# Patient Record
Sex: Female | Born: 1980
Health system: Southern US, Community
[De-identification: ages and names within clinical notes are randomized; demographics above are authoritative.]

## PROBLEM LIST (undated history)

## (undated) ENCOUNTER — Emergency Department: Discharge status: Absent without leave | Payer: 59

## (undated) DIAGNOSIS — R519 Headache, unspecified: Secondary | ICD-10-CM

## (undated) DIAGNOSIS — D649 Anemia, unspecified: Secondary | ICD-10-CM

## (undated) DIAGNOSIS — F419 Anxiety disorder, unspecified: Secondary | ICD-10-CM

## (undated) DIAGNOSIS — T7840XA Allergy, unspecified, initial encounter: Secondary | ICD-10-CM

## (undated) DIAGNOSIS — K589 Irritable bowel syndrome without diarrhea: Secondary | ICD-10-CM

## (undated) DIAGNOSIS — B019 Varicella without complication: Secondary | ICD-10-CM

## (undated) DIAGNOSIS — R51 Headache: Principal | ICD-10-CM

## (undated) HISTORY — DX: Anxiety disorder, unspecified: F41.9

## (undated) HISTORY — DX: Allergy, unspecified, initial encounter: T78.40XA

## (undated) HISTORY — DX: Headache, unspecified: R51.9

## (undated) HISTORY — PX: WISDOM TOOTH EXTRACTION: SHX21

## (undated) HISTORY — DX: Anemia, unspecified: D64.9

## (undated) HISTORY — DX: Headache: R51

## (undated) HISTORY — DX: Varicella without complication: B01.9

---

## 1999-11-04 ENCOUNTER — Other Ambulatory Visit: Admission: RE | Admit: 1999-11-04 | Discharge: 1999-11-04 | Payer: Self-pay | Admitting: Family Medicine

## 2001-02-14 ENCOUNTER — Other Ambulatory Visit: Admission: RE | Admit: 2001-02-14 | Discharge: 2001-02-14 | Payer: Self-pay | Admitting: Obstetrics and Gynecology

## 2002-07-05 HISTORY — PX: HEMORROIDECTOMY: SUR656

## 2008-07-05 HISTORY — PX: TONSILLECTOMY: SUR1361

## 2014-03-08 ENCOUNTER — Ambulatory Visit: Payer: Self-pay | Admitting: Gastroenterology

## 2014-08-12 ENCOUNTER — Emergency Department: Payer: Self-pay | Admitting: Emergency Medicine

## 2014-10-22 ENCOUNTER — Ambulatory Visit (INDEPENDENT_AMBULATORY_CARE_PROVIDER_SITE_OTHER): Payer: BLUE CROSS/BLUE SHIELD | Admitting: Internal Medicine

## 2014-10-22 ENCOUNTER — Encounter: Payer: Self-pay | Admitting: Internal Medicine

## 2014-10-22 VITALS — BP 106/70 | HR 88 | Temp 98.1°F | Ht 65.5 in | Wt 156.0 lb

## 2014-10-22 DIAGNOSIS — R519 Headache, unspecified: Secondary | ICD-10-CM

## 2014-10-22 DIAGNOSIS — K648 Other hemorrhoids: Secondary | ICD-10-CM | POA: Diagnosis not present

## 2014-10-22 DIAGNOSIS — R51 Headache: Secondary | ICD-10-CM

## 2014-10-22 DIAGNOSIS — K589 Irritable bowel syndrome without diarrhea: Secondary | ICD-10-CM | POA: Insufficient documentation

## 2014-10-22 DIAGNOSIS — K649 Unspecified hemorrhoids: Secondary | ICD-10-CM | POA: Insufficient documentation

## 2014-10-22 DIAGNOSIS — J302 Other seasonal allergic rhinitis: Secondary | ICD-10-CM | POA: Diagnosis not present

## 2014-10-22 NOTE — Patient Instructions (Signed)
Diet and Irritable Bowel Syndrome  No cure has been found for irritable bowel syndrome (IBS). Many options are available to treat the symptoms. Your caregiver will give you the best treatments available for your symptoms. He or she will also encourage you to manage stress and to make changes to your diet. You need to work with your caregiver and Registered Dietician to find the best combination of medicine, diet, counseling, and support to control your symptoms. The following are some diet suggestions. FOODS THAT MAKE IBS WORSE  Fatty foods, such as [place] fries.  Milk products, such as cheese or ice cream.  Chocolate.  Alcohol.  Caffeine (found in coffee and some sodas).  Carbonated drinks, such as soda. If certain foods cause symptoms, you should eat less of them or stop eating them. FOOD JOURNAL   Keep a journal of the foods that seem to cause distress. Write down:  What you are eating during the day and when.  What problems you are having after eating.  When the symptoms occur in relation to your meals.  What foods always make you feel badly.  Take your notes with you to your caregiver to see if you should stop eating certain foods. FOODS THAT MAKE IBS BETTER Fiber reduces IBS symptoms, especially constipation, because it makes stools soft, bulky, and easier to pass. Fiber is found in bran, bread, cereal, beans, fruit, and vegetables. Examples of foods with fiber include:  Apples.  Peaches.  Pears.  Berries.  Figs.  Broccoli, raw.  Cabbage.  Carrots.  Raw peas.  Kidney beans.  Lima beans.  Whole-grain bread.  Whole-grain cereal. Add foods with fiber to your diet a little at a time. This will let your body get used to them. Too much fiber at once might cause gas and swelling of your abdomen. This can trigger symptoms in a person with IBS. Caregivers usually recommend a diet with enough fiber to produce soft, painless bowel movements. High fiber diets may  cause gas and bloating. However, these symptoms often go away within a few weeks, as your body adjusts. In many cases, dietary fiber may lessen IBS symptoms, particularly constipation. However, it may not help pain or diarrhea. High fiber diets keep the colon mildly enlarged (distended) with the added fiber. This may help prevent spasms in the colon. Some forms of fiber also keep water in the stool, thereby preventing hard stools that are difficult to pass.  Besides telling you to eat more foods with fiber, your caregiver may also tell you to get more fiber by taking a fiber pill or drinking water mixed with a special high fiber powder. An example of this is a natural fiber laxative containing psyllium seed.  TIPS  Large meals can cause cramping and diarrhea in people with IBS. If this happens to you, try eating 4 or 5 small meals a day, or try eating less at each of your usual 3 meals. It may also help if your meals are low in fat and high in carbohydrates. Examples of carbohydrates are pasta, rice, whole-grain breads and cereals, fruits, and vegetables.  If dairy products cause your symptoms to flare up, you can try eating less of those foods. You might be able to handle yogurt better than other dairy products, because it contains bacteria that helps with digestion. Dairy products are an important source of calcium and other nutrients. If you need to avoid dairy products, be sure to talk with a Registered Dietitian about getting these nutrients  through other food sources.  Drink enough water and fluids to keep your urine clear or pale yellow. This is important, especially if you have diarrhea. FOR MORE INFORMATION  International Foundation for Functional Gastrointestinal Disorders: www.iffgd.org  National Digestive Diseases Information Clearinghouse: digestive.AmenCredit.is Document Released: 09/11/2003 Document Revised: 09/13/2011 Document Reviewed: 09/21/2013 [facility] Patient Information 2015  [place], [place]. This information is not intended to replace advice given to you by your health care provider. Make sure you discuss any questions you have with your health care provider.

## 2014-10-22 NOTE — Assessment & Plan Note (Signed)
Controlled by avoid her triggers Imitrex is effective in severe cases She does not need a refill at this time

## 2014-10-22 NOTE — Assessment & Plan Note (Signed)
Chronic but stable Continue probiotic daily Reviewed ingredients in Plexus GI cleanse, appears to be safe to take daily Will not pursue further workup with GI unless symptoms worsen

## 2014-10-22 NOTE — Progress Notes (Signed)
HPI  Pt presents to the clinic today to establish care and for management of the conditions listed below. She has not had a PCP in many years.  Frequent Headaches: Occuring about once per month. They are triggered by [place] food, too much or too little sleep. She has stopped caffeine, and alcohol. The headaches are located on the right side of her head. They are associated with nausea, vomiting, sensitivity to light and sound. When they first occur, she will try essential oils. If that is ineffective, she will take Excedrin Migraine. If that doesn't work, she will take the Imitrex and half a Pnenergan and lay in a dark room.  She also reports having stomach issues. She reports alternating constipation and diarrhea. She has been evaluated by GI in the past. She had a colonoscopy in 2014 which was normal. She had a hemorrhoidectomy in 2014 but continues to have issues with internal hemmorrhoids. She is using essential oils and taking a probiotic daily. She has also been taking a Plexus GI cleanse but just wanted to verify that it was safe to take daily.  Allergies: she reports she does not have a history of this. She went to UC a few weeks ago with c/o itchy eyes. They told her it was allergies and put her Pazeo daily prn. She reports it has worked well for her. She is not taking it daily.  Flu: 2014 Tetanus: 2014 LMP: 10/08/2014 Pap Smear: 09/2014, G2P2 Dentist: [facility]  Past Medical History  Diagnosis Date  . Chicken pox   . Frequent headaches   . Allergy     Current Outpatient Prescriptions  Medication Sig Dispense Refill  . LO LOESTRIN FE 1 MG-10 MCG / 10 MCG tablet Take 1 tablet by mouth daily.  12  . Multiple Vitamin (MULTIVITAMIN) tablet Take 1 tablet by mouth daily.    Marland Kitchen PAZEO 0.7 % SOLN Place 1 drop into both eyes. For 14 days as needed for allergies  2  . Promethazine HCl (PHENERGAN PO) Take 1 tablet by mouth as needed.    . SUMAtriptan (IMITREX) 100 MG tablet Take 1 tablet by  mouth. For migraine may repeat in 2hrs if not better Max 2/24hr  2   No current facility-administered medications for this visit.    No Known Allergies  Family History  Problem Relation Age of Onset  . Arthritis Father   . Hyperlipidemia Father   . Hypertension Father   . Arthritis Maternal Grandmother   . Stroke Maternal Grandfather   . Cancer Paternal Grandmother     Ovarian     History   Social History  . Marital Status: Married    Spouse Name: N/A  . Number of Children: N/A  . Years of Education: N/A   Occupational History  . Not on file.   Social History Main Topics  . Smoking status: Never Smoker   . Smokeless tobacco: Never Used  . Alcohol Use: 0.0 oz/week    0 Standard drinks or equivalent per week     Comment: rare  . Drug Use: No  . Sexual Activity: Not on file   Other Topics Concern  . Not on file   Social History Narrative  . No narrative on file    ROS:  Constitutional: Denies fever, malaise, fatigue, headache or abrupt weight changes.  HEENT: Denies eye pain, eye redness, ear pain, ringing in the ears, wax buildup, runny nose, nasal congestion, bloody nose, or sore throat. Respiratory: Denies difficulty breathing, shortness  of breath, cough or sputum production.   Cardiovascular: Denies chest pain, chest tightness, palpitations or swelling in the hands or feet.  Gastrointestinal: Denies abdominal pain, bloating, constipation, diarrhea or blood in the stool.  Skin: Denies redness, rashes, lesions or ulcercations.  Neurological: Denies dizziness, difficulty with memory, difficulty with speech or problems with balance and coordination.  Psych: Denies anxiety, depression, SI/HI.  No other specific complaints in a complete review of systems (except as listed in HPI above).  PE:  BP 106/70 mmHg  Pulse 88  Temp(Src) 98.1 F (36.7 C) (Oral)  Ht 5' 5.5" (1.664 m)  Wt 156 lb (70.761 kg)  BMI 25.56 kg/m2  SpO2 98%  LMP 10/08/2014 Wt Readings  from Last 3 Encounters:  10/22/14 156 lb (70.761 kg)    General: Appears her stated age, well developed, well nourished in NAD. HEENT: Head: normal shape and size; Eyes: sclera white, no icterus, conjunctiva pink, PERRLA and EOMs intact;  Cardiovascular: Normal rate and rhythm. S1,S2 noted.  No murmur, rubs or gallops noted.  Pulmonary/Chest: Normal effort and positive vesicular breath sounds. No respiratory distress. No wheezes, rales or ronchi noted.  Abdomen: Soft and nontender. Normal bowel sounds, no bruits noted. No distention or masses noted. Liver, spleen and kidneys non palpable. Neurological: Alert and oriented. Psychiatric: Mood and affect normal. Behavior is normal. Judgment and thought content normal.     Assessment and Plan:  Make an appt for an annual exam

## 2014-10-22 NOTE — Progress Notes (Signed)
Pre visit review using our clinic review tool, if applicable. No additional management support is needed unless otherwise documented below in the visit note. 

## 2014-10-22 NOTE — Assessment & Plan Note (Signed)
Chronic secondary to IBS She has had hemorrhoidectomy in the past She takes probiotic daily She has cream and wipes and uses them as needed Advised her to drink plenty of water

## 2014-10-22 NOTE — Assessment & Plan Note (Signed)
Continue Pazeo prn

## 2014-11-18 ENCOUNTER — Encounter: Payer: BLUE CROSS/BLUE SHIELD | Admitting: Internal Medicine

## 2014-11-25 ENCOUNTER — Encounter (INDEPENDENT_AMBULATORY_CARE_PROVIDER_SITE_OTHER): Payer: Self-pay

## 2014-11-25 ENCOUNTER — Ambulatory Visit (INDEPENDENT_AMBULATORY_CARE_PROVIDER_SITE_OTHER): Payer: BLUE CROSS/BLUE SHIELD | Admitting: Internal Medicine

## 2014-11-25 ENCOUNTER — Encounter: Payer: Self-pay | Admitting: Internal Medicine

## 2014-11-25 VITALS — BP 104/68 | HR 83 | Temp 97.8°F | Ht 65.5 in | Wt 155.0 lb

## 2014-11-25 DIAGNOSIS — K589 Irritable bowel syndrome without diarrhea: Secondary | ICD-10-CM

## 2014-11-25 DIAGNOSIS — Z0000 Encounter for general adult medical examination without abnormal findings: Secondary | ICD-10-CM | POA: Diagnosis not present

## 2014-11-25 DIAGNOSIS — N912 Amenorrhea, unspecified: Secondary | ICD-10-CM

## 2014-11-25 LAB — COMPREHENSIVE METABOLIC PANEL
ALBUMIN: 4.1 g/dL (ref 3.5–5.2)
ALT: 17 U/L (ref 0–35)
AST: 15 U/L (ref 0–37)
Alkaline Phosphatase: 39 U/L (ref 39–117)
BILIRUBIN TOTAL: 0.4 mg/dL (ref 0.2–1.2)
BUN: 22 mg/dL (ref 6–23)
CALCIUM: 9 mg/dL (ref 8.4–10.5)
CHLORIDE: 105 meq/L (ref 96–112)
CO2: 23 mEq/L (ref 19–32)
Creatinine, Ser: 0.52 mg/dL (ref 0.40–1.20)
GFR: 141.81 mL/min (ref >60.00)
GLUCOSE: 96 mg/dL (ref 70–99)
POTASSIUM: 3.7 meq/L (ref 3.5–5.1)
Sodium: 136 mEq/L (ref 135–145)
TOTAL PROTEIN: 7.1 g/dL (ref 6.0–8.3)

## 2014-11-25 LAB — LIPID PANEL
Cholesterol: 201 mg/dL — ABNORMAL HIGH (ref 0–200)
HDL: 47.1 mg/dL (ref >39.00)
LDL CALC: 140 mg/dL — AB (ref 0–99)
NonHDL: 153.9
TRIGLYCERIDES: 70 mg/dL (ref 0.0–149.0)
Total CHOL/HDL Ratio: 4
VLDL: 14 mg/dL (ref 0.0–40.0)

## 2014-11-25 LAB — CBC
HEMATOCRIT: 36.2 % (ref 36.0–46.0)
HEMOGLOBIN: 12.3 g/dL (ref 12.0–15.0)
MCHC: 33.9 g/dL (ref 30.0–36.0)
MCV: 88.6 fl (ref 78.0–100.0)
Platelets: 334 10*3/uL (ref 150.0–400.0)
RBC: 4.08 Mil/uL (ref 3.87–5.11)
RDW: 13 % (ref 11.5–15.5)
WBC: 8.1 10*3/uL (ref 4.0–10.5)

## 2014-11-25 LAB — LUTEINIZING HORMONE: LH: 1.41 m[IU]/mL

## 2014-11-25 LAB — FOLLICLE STIMULATING HORMONE: FSH: 3.2 m[IU]/mL

## 2014-11-25 MED ORDER — SERTRALINE HCL 50 MG PO TABS
50.0000 mg | ORAL_TABLET | Freq: Every day | ORAL | Status: DC
Start: 2014-11-25 — End: 2015-03-18

## 2014-11-25 NOTE — Patient Instructions (Signed)

## 2014-11-25 NOTE — Progress Notes (Signed)
Pre visit review using our clinic review tool, if applicable. No additional management support is needed unless otherwise documented below in the visit note. 

## 2014-11-25 NOTE — Progress Notes (Signed)
Subjective:    Patient ID: Robin West, female    DOB: 12-17-1980, 34 y.o.   MRN: [id number]  HPI  Pt presents to the clinic today for her annual exam.  Flu: 2014 Tetanus: 2014 LMP: 10/08/2014, she has not had a period since this time. She is sexually active but takes OCP's daily. She has already been through 1 week of placebo's without a menstrual and is about to go through her second week of placebo's. She did take a urine pregnancy test at home which was negative. Pap Smear: 09/2014, G2P2 Dentist: [facility]  Diet: She consumes a well balanced diet, filled with fruits and vegetables. Exercise: She exercises 3-4 days per week.  She is concerned about her IBS. She reports she has alternating constipation and diarrhea. She has had about 15 loose stools in the last 24-48 hours. She has no abdominal cramping. She denies blood in her stool, nausea or vomiting. She was on a medication in the past for this but can not remember the name. She has been seen by a GI doctor but it has been a few years.  Review of Systems      Past Medical History  Diagnosis Date  . Chicken pox   . Frequent headaches   . Allergy     Current Outpatient Prescriptions  Medication Sig Dispense Refill  . LO LOESTRIN FE 1 MG-10 MCG / 10 MCG tablet Take 1 tablet by mouth daily.  12  . Multiple Vitamin (MULTIVITAMIN) tablet Take 1 tablet by mouth daily.    Marland Kitchen PAZEO 0.7 % SOLN Place 1 drop into both eyes. For 14 days as needed for allergies  2  . Promethazine HCl (PHENERGAN PO) Take 1 tablet by mouth as needed.    . SUMAtriptan (IMITREX) 100 MG tablet Take 1 tablet by mouth. For migraine may repeat in 2hrs if not better Max 2/24hr  2   No current facility-administered medications for this visit.    No Known Allergies  Family History  Problem Relation Age of Onset  . Arthritis Father   . Hyperlipidemia Father   . Hypertension Father   . Arthritis Maternal Grandmother   . Stroke Maternal Grandfather   .  Cancer Paternal Grandmother     Ovarian   . Cancer Paternal Aunt     ovarian cancer    History   Social History  . Marital Status: Married    Spouse Name: N/A  . Number of Children: N/A  . Years of Education: N/A   Occupational History  . Not on file.   Social History Main Topics  . Smoking status: Never Smoker   . Smokeless tobacco: Never Used  . Alcohol Use: 0.0 oz/week    0 Standard drinks or equivalent per week     Comment: rare  . Drug Use: No  . Sexual Activity: Yes   Other Topics Concern  . Not on file   Social History Narrative     Constitutional: Denies fever, malaise, fatigue, headache or abrupt weight changes.  HEENT: Denies eye pain, eye redness, ear pain, ringing in the ears, wax buildup, runny nose, nasal congestion, bloody nose, or sore throat. Respiratory: Denies difficulty breathing, shortness of breath, cough or sputum production.   Cardiovascular: Denies chest pain, chest tightness, palpitations or swelling in the hands or feet.  Gastrointestinal: Pt reports diarrhea and constipation. Denies abdominal pain, bloating, or blood in the stool.  GU: Pt reports amenorrhea. Denies urgency, frequency, pain with urination, burning sensation,  blood in urine, odor or discharge. Musculoskeletal: Denies decrease in range of motion, difficulty with gait, muscle pain or joint pain and swelling.  Skin: Denies redness, rashes, lesions or ulcercations.  Neurological: Denies dizziness, difficulty with memory, difficulty with speech or problems with balance and coordination.  Psych: Pt denies anxiety, depression, SI/HI.  No other specific complaints in a complete review of systems (except as listed in HPI above).  Objective:   Physical Exam   BP 104/68 mmHg  Pulse 83  Temp(Src) 97.8 F (36.6 C) (Oral)  Ht 5' 5.5" (1.664 m)  Wt 155 lb (70.308 kg)  BMI 25.39 kg/m2  SpO2 98%  LMP 10/01/2014 Wt Readings from Last 3 Encounters:  11/25/14 155 lb (70.308 kg)    10/22/14 156 lb (70.761 kg)    General: Appears her stated age, well developed, well nourished in NAD. Skin: Warm, dry and intact. No rashes, lesions or ulcerations noted. HEENT: Head: normal shape and size; Eyes: sclera white, no icterus, conjunctiva pink, PERRLA and EOMs intact; Ears: Tm's gray and intact, normal light reflex; Throat/Mouth: Teeth present, mucosa pink and moist, no exudate, lesions or ulcerations noted.  Neck: Normal range of motion. Neck supple, trachea midline. No massses, lumps or thyromegaly present.  Cardiovascular: Normal rate and rhythm. S1,S2 noted.  No murmur, rubs or gallops noted. No JVD or BLE edema. No carotid bruits noted. Pulmonary/Chest: Normal effort and positive vesicular breath sounds. No respiratory distress. No wheezes, rales or ronchi noted.  Abdomen: Soft and nontender. Normal bowel sounds, no bruits noted. No distention or masses noted. Liver, spleen and kidneys non palpable. Musculoskeletal: Strength 5/5 BUE/BLE. No signs of joint swelling. No difficulty with gait.  Neurological: Alert and oriented. Cranial nerves II-XII grossly intact. Coordination normal.  Psychiatric: Mood slightly anxious today but affect normal. Behavior is normal. Judgment and thought content normal.        Assessment & Plan:   Preventative Health Maintenance:  Will check CBC, CMET and Lipid profile today She will get a flu shot in the fall All other HM UTD Encouraged to continue a healthy lifestyle  Amenorrhea:  Will check serum hcg Will check FSH/LH If labs normal, will have her follow up with her GYN  IBS:  Discussed treatment options She is interested in trying an SSRI eRx for Zoloft She will update me in 4 weeks and let me know how she is doing  RTC in 1 year or sooner if needed

## 2014-11-26 LAB — HCG, SERUM, QUALITATIVE: Preg, Serum: NEGATIVE

## 2015-02-20 ENCOUNTER — Encounter: Payer: Self-pay | Admitting: Internal Medicine

## 2015-02-20 ENCOUNTER — Ambulatory Visit (INDEPENDENT_AMBULATORY_CARE_PROVIDER_SITE_OTHER): Payer: BLUE CROSS/BLUE SHIELD | Admitting: Internal Medicine

## 2015-02-20 ENCOUNTER — Ambulatory Visit (INDEPENDENT_AMBULATORY_CARE_PROVIDER_SITE_OTHER)
Admission: RE | Admit: 2015-02-20 | Discharge: 2015-02-20 | Disposition: A | Discharge status: Home / Self Care | Payer: BLUE CROSS/BLUE SHIELD | Source: Ambulatory Visit | Attending: Internal Medicine | Admitting: Internal Medicine

## 2015-02-20 VITALS — BP 104/70 | HR 87 | Temp 98.2°F | Wt 153.0 lb

## 2015-02-20 DIAGNOSIS — M25511 Pain in right shoulder: Secondary | ICD-10-CM

## 2015-02-20 DIAGNOSIS — M25551 Pain in right hip: Secondary | ICD-10-CM

## 2015-02-20 DIAGNOSIS — W19XXXA Unspecified fall, initial encounter: Secondary | ICD-10-CM | POA: Diagnosis not present

## 2015-02-20 DIAGNOSIS — Y92009 Unspecified place in unspecified non-institutional (private) residence as the place of occurrence of the external cause: Secondary | ICD-10-CM

## 2015-02-20 DIAGNOSIS — M545 Low back pain, unspecified: Secondary | ICD-10-CM

## 2015-02-20 DIAGNOSIS — Y92099 Unspecified place in other non-institutional residence as the place of occurrence of the external cause: Secondary | ICD-10-CM

## 2015-02-20 MED ORDER — METHOCARBAMOL 500 MG PO TABS: 500.0000 mg | ORAL_TABLET | Freq: Three times a day (TID) | ORAL | Status: DC

## 2015-02-20 MED ORDER — HYDROCODONE-ACETAMINOPHEN 10-325 MG PO TABS: 1.0000 | ORAL_TABLET | Freq: Three times a day (TID) | ORAL | Status: DC | PRN

## 2015-02-20 NOTE — Progress Notes (Signed)
Subjective:    Patient ID: Robin West, female    DOB: 05/22/1981, 34 y.o.   MRN: [id number]  HPI  Pt presents to the clinic today with c/o a fall. This occurred this morning. She was walking into the garage from her house when she missed the first step. She landed on her buttocks. She caught herself with her right arm.She has pain that starts in the right side of her back and radiates into her right hip and leg. She describes the pain as sharp and shooting. She denies numbness and tingling. The pain is worse with weight bearing. She has pain in her right shoulder but denies weakness, numbness or tingling. She reports she hit her face as well on the concrete floor. She denies LOC but did see some blood in her mouth. She has not tried anything OTC. She denies loss of bowel or bladder.  Review of Systems      Past Medical History  Diagnosis Date  . Chicken pox   . Frequent headaches   . Allergy     Current Outpatient Prescriptions  Medication Sig Dispense Refill  . LO LOESTRIN FE 1 MG-10 MCG / 10 MCG tablet Take 1 tablet by mouth daily.  12  . PAZEO 0.7 % SOLN Place 1 drop into both eyes. For 14 days as needed for allergies  2  . Promethazine HCl (PHENERGAN PO) Take 1 tablet by mouth as needed.    . sertraline (ZOLOFT) 50 MG tablet Take 1 tablet (50 mg total) by mouth daily. 30 tablet 3  . SUMAtriptan (IMITREX) 100 MG tablet Take 1 tablet by mouth. For migraine may repeat in 2hrs if not better Max 2/24hr  2  . Multiple Vitamin (MULTIVITAMIN) tablet Take 1 tablet by mouth daily.     No current facility-administered medications for this visit.    No Known Allergies  Family History  Problem Relation Age of Onset  . Arthritis Father   . Hyperlipidemia Father   . Hypertension Father   . Arthritis Maternal Grandmother   . Stroke Maternal Grandfather   . Cancer Paternal Grandmother     Ovarian   . Cancer Paternal Aunt     ovarian cancer    Social History   Social History    . Marital Status: Married    Spouse Name: N/A  . Number of Children: N/A  . Years of Education: N/A   Occupational History  . Not on file.   Social History Main Topics  . Smoking status: Never Smoker   . Smokeless tobacco: Never Used  . Alcohol Use: 0.0 oz/week    0 Standard drinks or equivalent per week     Comment: rare  . Drug Use: No  . Sexual Activity: Yes   Other Topics Concern  . Not on file   Social History Narrative     Constitutional: Denies fever, malaise, fatigue, headache or abrupt weight changes.  HEENT: Pt reports gum abrasion. Denies eye pain, eye redness, ear pain, ringing in the ears, wax buildup, runny nose, nasal congestion, bloody nose, or sore throat. Respiratory: Denies difficulty breathing, shortness of breath, cough or sputum production.   Cardiovascular: Denies chest pain, chest tightness, palpitations or swelling in the hands or feet.  Musculoskeletal: Pt reports right shoulder pain, low back pain, right hip pain and right leg pain. Denies joint  swelling.  Skin: Denies redness, rashes, lesions or ulcercations.  Neurological: Denies dizziness, difficulty with memory, difficulty with speech or problems with  balance and coordination.   No other specific complaints in a complete review of systems (except as listed in HPI above).  Objective:   Physical Exam   BP 104/70 mmHg  Pulse 87  Temp(Src) 98.2 F (36.8 C) (Oral)  Wt 153 lb (69.4 kg)  SpO2 98%  LMP  Wt Readings from Last 3 Encounters:  02/20/15 153 lb (69.4 kg)  11/25/14 155 lb (70.308 kg)  10/22/14 156 lb (70.761 kg)    General: Appears her stated age, well developed, well nourished in NAD. Skin: Warm, dry and intact. Abrasion noted over lumbar spine. HEENT: Head: normal shape and size; Throat/Mouth: Teeth present, no chipped teeth noted. Small abrasion of right upper gum line.  Cardiovascular: Normal rate and rhythm. S1,S2 noted.  No murmur, rubs or gallops noted. Pulmonary/Chest:  Normal effort and positive vesicular breath sounds. No respiratory distress. No wheezes, rales or ronchi noted.  Abdomen: Soft and nontender. Normal bowel sounds, no bruits noted.  Musculoskeletal: Decreased flexion and rotation of the lumbar spine. Normal extension. Pain with palpation over the lumbar spine and right paralumbar muscles. Normal internal rotation of the right hip. Decreased external rotation of the right hip. Normal flexion and extension of the right knee. No pain with palpation of the knee. Decreased internal and external rotation of the right shoulder. No pain with palpation of the right shoulder. No joint swelling noted. Strength 5/5 BUE. Strength 4/5 RLE, 5/5 LLE. Negative drop can test. Neurological: Alert and oriented. Negative SLR bilaterally   BMET    Component Value Date/Time   NA 136 11/25/2014 0910   K 3.7 11/25/2014 0910   CL 105 11/25/2014 0910   CO2 23 11/25/2014 0910   GLUCOSE 96 11/25/2014 0910   BUN 22 11/25/2014 0910   CREATININE 0.52 11/25/2014 0910   CALCIUM 9.0 11/25/2014 0910    Lipid Panel     Component Value Date/Time   CHOL 201* 11/25/2014 0910   TRIG 70.0 11/25/2014 0910   HDL 47.10 11/25/2014 0910   CHOLHDL 4 11/25/2014 0910   VLDL 14.0 11/25/2014 0910   LDLCALC 140* 11/25/2014 0910    CBC    Component Value Date/Time   WBC 8.1 11/25/2014 0910   RBC 4.08 11/25/2014 0910   HGB 12.3 11/25/2014 0910   HCT 36.2 11/25/2014 0910   PLT 334.0 11/25/2014 0910   MCV 88.6 11/25/2014 0910   MCHC 33.9 11/25/2014 0910   RDW 13.0 11/25/2014 0910    Hgb A1C No results found for: HGBA1C      Assessment & Plan:   Right shoulder pain s/p fall:  Likely just soft tissue injury Put ice on the affected area for 15 min bid Ibuprofen as needed for pain Shoulder exercises given  Low back pain, right hip pain s/p fall:  Xray of lumbar spine today RX for Norco TID prn for severe pain eRx for Robaxin TID prn for muscle tightness Back  exercises given  Will follow up as needed. RTC as needed or if symptoms persist or worsen

## 2015-02-20 NOTE — Progress Notes (Signed)
Pre visit review using our clinic review tool, if applicable. No additional management support is needed unless otherwise documented below in the visit note. 

## 2015-02-20 NOTE — Patient Instructions (Signed)
Back Exercises These exercises may help you when beginning to rehabilitate your injury. Your symptoms may resolve with or without further involvement from your physician, physical therapist or athletic trainer. While completing these exercises, remember:   Restoring tissue flexibility helps normal motion to return to the joints. This allows healthier, less painful movement and activity.  An effective stretch should be held for at least 30 seconds.  A stretch should never be painful. You should only feel a gentle lengthening or release in the stretched tissue. STRETCH - Extension, Prone on Elbows   Lie on your stomach on the floor, a bed will be too soft. Place your palms about shoulder width apart and at the height of your head.  Place your elbows under your shoulders. If this is too painful, stack pillows under your chest.  Allow your body to relax so that your hips drop lower and make contact more completely with the floor.  Hold this position for __________ seconds.  Slowly return to lying flat on the floor. Repeat __________ times. Complete this exercise __________ times per day.  RANGE OF MOTION - Extension, Prone Press Ups   Lie on your stomach on the floor, a bed will be too soft. Place your palms about shoulder width apart and at the height of your head.  Keeping your back as relaxed as possible, slowly straighten your elbows while keeping your hips on the floor. You may adjust the placement of your hands to maximize your comfort. As you gain motion, your hands will come more underneath your shoulders.  Hold this position __________ seconds.  Slowly return to lying flat on the floor. Repeat __________ times. Complete this exercise __________ times per day.  RANGE OF MOTION- Quadruped, Neutral Spine   Assume a hands and knees position on a firm surface. Keep your hands under your shoulders and your knees under your hips. You may place padding under your knees for  comfort.  Drop your head and point your tail bone toward the ground below you. This will round out your low back like an angry cat. Hold this position for __________ seconds.  Slowly lift your head and release your tail bone so that your back sags into a large arch, like an old horse.  Hold this position for __________ seconds.  Repeat this until you feel limber in your low back.  Now, find your "sweet spot." This will be the most comfortable position somewhere between the two previous positions. This is your neutral spine. Once you have found this position, tense your stomach muscles to support your low back.  Hold this position for __________ seconds. Repeat __________ times. Complete this exercise __________ times per day.  STRETCH - Flexion, Single Knee to Chest   Lie on a firm bed or floor with both legs extended in front of you.  Keeping one leg in contact with the floor, bring your opposite knee to your chest. Hold your leg in place by either grabbing behind your thigh or at your knee.  Pull until you feel a gentle stretch in your low back. Hold __________ seconds.  Slowly release your grasp and repeat the exercise with the opposite side. Repeat __________ times. Complete this exercise __________ times per day.  STRETCH - Hamstrings, Standing  Stand or sit and extend your right / left leg, placing your foot on a chair or foot stool  Keeping a slight arch in your low back and your hips straight forward.  Lead with your chest and   lean forward at the waist until you feel a gentle stretch in the back of your right / left knee or thigh. (When done correctly, this exercise requires leaning only a small distance.)  Hold this position for __________ seconds. Repeat __________ times. Complete this stretch __________ times per day. STRENGTHENING - Deep Abdominals, Pelvic Tilt   Lie on a firm bed or floor. Keeping your legs in front of you, bend your knees so they are both pointed  toward the ceiling and your feet are flat on the floor.  Tense your lower abdominal muscles to press your low back into the floor. This motion will rotate your pelvis so that your tail bone is scooping upwards rather than pointing at your feet or into the floor.  With a gentle tension and even breathing, hold this position for __________ seconds. Repeat __________ times. Complete this exercise __________ times per day.  STRENGTHENING - Abdominals, Crunches   Lie on a firm bed or floor. Keeping your legs in front of you, bend your knees so they are both pointed toward the ceiling and your feet are flat on the floor. Cross your arms over your chest.  Slightly tip your chin down without bending your neck.  Tense your abdominals and slowly lift your trunk high enough to just clear your shoulder blades. Lifting higher can put excessive stress on the low back and does not further strengthen your abdominal muscles.  Control your return to the starting position. Repeat __________ times. Complete this exercise __________ times per day.  STRENGTHENING - Quadruped, Opposite UE/LE Lift   Assume a hands and knees position on a firm surface. Keep your hands under your shoulders and your knees under your hips. You may place padding under your knees for comfort.  Find your neutral spine and gently tense your abdominal muscles so that you can maintain this position. Your shoulders and hips should form a rectangle that is parallel with the floor and is not twisted.  Keeping your trunk steady, lift your right hand no higher than your shoulder and then your left leg no higher than your hip. Make sure you are not holding your breath. Hold this position __________ seconds.  Continuing to keep your abdominal muscles tense and your back steady, slowly return to your starting position. Repeat with the opposite arm and leg. Repeat __________ times. Complete this exercise __________ times per day. Document Released:  07/09/2005 Document Revised: 09/13/2011 Document Reviewed: 10/03/2008 [facility] Patient Information 2015 [place], [place]. This information is not intended to replace advice given to you by your health care provider. Make sure you discuss any questions you have with your health care provider. Back Exercises These exercises may help you when beginning to rehabilitate your injury. Your symptoms may resolve with or without further involvement from your physician, physical therapist or athletic trainer. While completing these exercises, remember:   Restoring tissue flexibility helps normal motion to return to the joints. This allows healthier, less painful movement and activity.  An effective stretch should be held for at least 30 seconds.  A stretch should never be painful. You should only feel a gentle lengthening or release in the stretched tissue. STRETCH - Extension, Prone on Elbows   Lie on your stomach on the floor, a bed will be too soft. Place your palms about shoulder width apart and at the height of your head.  Place your elbows under your shoulders. If this is too painful, stack pillows under your chest.  Allow your body  to relax so that your hips drop lower and make contact more completely with the floor.  Hold this position for __________ seconds.  Slowly return to lying flat on the floor. Repeat __________ times. Complete this exercise __________ times per day.  RANGE OF MOTION - Extension, Prone Press Ups   Lie on your stomach on the floor, a bed will be too soft. Place your palms about shoulder width apart and at the height of your head.  Keeping your back as relaxed as possible, slowly straighten your elbows while keeping your hips on the floor. You may adjust the placement of your hands to maximize your comfort. As you gain motion, your hands will come more underneath your shoulders.  Hold this position __________ seconds.  Slowly return to lying flat on the floor. Repeat  __________ times. Complete this exercise __________ times per day.  RANGE OF MOTION- Quadruped, Neutral Spine   Assume a hands and knees position on a firm surface. Keep your hands under your shoulders and your knees under your hips. You may place padding under your knees for comfort.  Drop your head and point your tail bone toward the ground below you. This will round out your low back like an angry cat. Hold this position for __________ seconds.  Slowly lift your head and release your tail bone so that your back sags into a large arch, like an old horse.  Hold this position for __________ seconds.  Repeat this until you feel limber in your low back.  Now, find your "sweet spot." This will be the most comfortable position somewhere between the two previous positions. This is your neutral spine. Once you have found this position, tense your stomach muscles to support your low back.  Hold this position for __________ seconds. Repeat __________ times. Complete this exercise __________ times per day.  STRETCH - Flexion, Single Knee to Chest   Lie on a firm bed or floor with both legs extended in front of you.  Keeping one leg in contact with the floor, bring your opposite knee to your chest. Hold your leg in place by either grabbing behind your thigh or at your knee.  Pull until you feel a gentle stretch in your low back. Hold __________ seconds.  Slowly release your grasp and repeat the exercise with the opposite side. Repeat __________ times. Complete this exercise __________ times per day.  STRETCH - Hamstrings, Standing  Stand or sit and extend your right / left leg, placing your foot on a chair or foot stool  Keeping a slight arch in your low back and your hips straight forward.  Lead with your chest and lean forward at the waist until you feel a gentle stretch in the back of your right / left knee or thigh. (When done correctly, this exercise requires leaning only a small  distance.)  Hold this position for __________ seconds. Repeat __________ times. Complete this stretch __________ times per day. STRENGTHENING - Deep Abdominals, Pelvic Tilt   Lie on a firm bed or floor. Keeping your legs in front of you, bend your knees so they are both pointed toward the ceiling and your feet are flat on the floor.  Tense your lower abdominal muscles to press your low back into the floor. This motion will rotate your pelvis so that your tail bone is scooping upwards rather than pointing at your feet or into the floor.  With a gentle tension and even breathing, hold this position for __________ seconds.  Repeat __________ times. Complete this exercise __________ times per day.  STRENGTHENING - Abdominals, Crunches   Lie on a firm bed or floor. Keeping your legs in front of you, bend your knees so they are both pointed toward the ceiling and your feet are flat on the floor. Cross your arms over your chest.  Slightly tip your chin down without bending your neck.  Tense your abdominals and slowly lift your trunk high enough to just clear your shoulder blades. Lifting higher can put excessive stress on the low back and does not further strengthen your abdominal muscles.  Control your return to the starting position. Repeat __________ times. Complete this exercise __________ times per day.  STRENGTHENING - Quadruped, Opposite UE/LE Lift   Assume a hands and knees position on a firm surface. Keep your hands under your shoulders and your knees under your hips. You may place padding under your knees for comfort.  Find your neutral spine and gently tense your abdominal muscles so that you can maintain this position. Your shoulders and hips should form a rectangle that is parallel with the floor and is not twisted.  Keeping your trunk steady, lift your right hand no higher than your shoulder and then your left leg no higher than your hip. Make sure you are not holding your breath.  Hold this position __________ seconds.  Continuing to keep your abdominal muscles tense and your back steady, slowly return to your starting position. Repeat with the opposite arm and leg. Repeat __________ times. Complete this exercise __________ times per day. Document Released: 07/09/2005 Document Revised: 09/13/2011 Document Reviewed: 10/03/2008 [facility] Patient Information 2015 [place], [place]. This information is not intended to replace advice given to you by your health care provider. Make sure you discuss any questions you have with your health care provider.

## 2015-02-28 ENCOUNTER — Encounter: Payer: Self-pay | Admitting: Internal Medicine

## 2015-02-28 ENCOUNTER — Ambulatory Visit (INDEPENDENT_AMBULATORY_CARE_PROVIDER_SITE_OTHER): Payer: BLUE CROSS/BLUE SHIELD | Admitting: Internal Medicine

## 2015-02-28 VITALS — BP 106/68 | HR 73 | Temp 98.6°F | Wt 156.0 lb

## 2015-02-28 DIAGNOSIS — T148XXA Other injury of unspecified body region, initial encounter: Secondary | ICD-10-CM

## 2015-02-28 DIAGNOSIS — T148 Other injury of unspecified body region: Secondary | ICD-10-CM | POA: Diagnosis not present

## 2015-02-28 MED ORDER — TRAMADOL HCL 50 MG PO TABS: 50.0000 mg | ORAL_TABLET | Freq: Three times a day (TID) | ORAL | Status: DC | PRN

## 2015-02-28 NOTE — Progress Notes (Signed)
Pre visit review using our clinic review tool, if applicable. No additional management support is needed unless otherwise documented below in the visit note. 

## 2015-02-28 NOTE — Patient Instructions (Signed)

## 2015-02-28 NOTE — Progress Notes (Signed)
Subjective:    Patient ID: Robin West, female    DOB: [birth year], 34 y.o.   MRN: [id number]  HPI  Pt presents to the clinic today with continued right side back pain. She fell 8/18 down her garage steps. Xray at that time was normal. She was given Norco and Robaxin. She describes the pain as a stabbing sensation. It is constant and radiates down her right leg. The pain is worse with certain movements. She denies numbness or tingling. She reports the Norco made her sick. She has been taking Ibuprofen 800 mg in its place. She has tried stretching, heat and ice but she feels like her symptoms have gotten worse.  Review of Systems      Past Medical History  Diagnosis Date  . Chicken pox   . Frequent headaches   . Allergy     Current Outpatient Prescriptions  Medication Sig Dispense Refill  . HYDROcodone-acetaminophen (NORCO) 10-325 MG per tablet Take 1 tablet by mouth every 8 (eight) hours as needed. 30 tablet 0  . LO LOESTRIN FE 1 MG-10 MCG / 10 MCG tablet Take 1 tablet by mouth daily.  12  . methocarbamol (ROBAXIN) 500 MG tablet Take 1 tablet (500 mg total) by mouth 3 (three) times daily. 30 tablet 0  . Multiple Vitamin (MULTIVITAMIN) tablet Take 1 tablet by mouth daily.    Marland Kitchen PAZEO 0.7 % SOLN Place 1 drop into both eyes. For 14 days as needed for allergies  2  . Promethazine HCl (PHENERGAN PO) Take 1 tablet by mouth as needed.    . sertraline (ZOLOFT) 50 MG tablet Take 1 tablet (50 mg total) by mouth daily. 30 tablet 3  . SUMAtriptan (IMITREX) 100 MG tablet Take 1 tablet by mouth. For migraine may repeat in 2hrs if not better Max 2/24hr  2   No current facility-administered medications for this visit.    No Known Allergies  Family History  Problem Relation Age of Onset  . Arthritis Father   . Hyperlipidemia Father   . Hypertension Father   . Arthritis Maternal Grandmother   . Stroke Maternal Grandfather   . Cancer Paternal Grandmother     Ovarian   . Cancer Paternal  Aunt     ovarian cancer    Social History   Social History  . Marital Status: Married    Spouse Name: N/A  . Number of Children: N/A  . Years of Education: N/A   Occupational History  . Not on file.   Social History Main Topics  . Smoking status: Never Smoker   . Smokeless tobacco: Never Used  . Alcohol Use: 0.0 oz/week    0 Standard drinks or equivalent per week     Comment: rare  . Drug Use: No  . Sexual Activity: Yes   Other Topics Concern  . Not on file   Social History Narrative     Constitutional: Denies fever, malaise, fatigue, headache or abrupt weight changes.  Respiratory: Denies difficulty breathing, shortness of breath, cough or sputum production.   Cardiovascular: Denies chest pain, chest tightness, palpitations or swelling in the hands or feet.  Musculoskeletal: Pt reports back pain. Denies difficulty with gait,  or joint pain and swelling.  Neurological: Denies dizziness, difficulty with memory, difficulty with speech or problems with balance and coordination.   No other specific complaints in a complete review of systems (except as listed in HPI above).  Objective:   Physical Exam    BP 106/68 mmHg  Pulse 73  Temp(Src) 98.6 F (37 C) (Oral)  Wt 156 lb (70.761 kg)  SpO2 98%  LMP 01/30/2015 (Approximate) Wt Readings from Last 3 Encounters:  02/28/15 156 lb (70.761 kg)  02/20/15 153 lb (69.4 kg)  11/25/14 155 lb (70.308 kg)    General: Appears her stated age, well developed, well nourished in NAD. Cardiovascular: Normal rate and rhythm. S1,S2 noted.  No murmur, rubs or gallops noted.  Pulmonary/Chest: Normal effort and positive vesicular breath sounds. No respiratory distress. No wheezes, rales or ronchi noted.  Musculoskeletal: Decreased flexion and rotation of the lumbar spine. Normal extension. Pain with palpation over the lumbar spine and right paralumbar muscles. Normal internal rotation of the right hip. Decreased external rotation of the  right hip. No joint swelling noted.  Strength 4/5 RLE, 5/5 LLE.  Neurological: Alert and oriented. Cranial nerves II-XII intact. Coordination normal. +DTRs bilaterally.   BMET    Component Value Date/Time   NA 136 11/25/2014 0910   K 3.7 11/25/2014 0910   CL 105 11/25/2014 0910   CO2 23 11/25/2014 0910   GLUCOSE 96 11/25/2014 0910   BUN 22 11/25/2014 0910   CREATININE 0.52 11/25/2014 0910   CALCIUM 9.0 11/25/2014 0910    Lipid Panel     Component Value Date/Time   CHOL 201* 11/25/2014 0910   TRIG 70.0 11/25/2014 0910   HDL 47.10 11/25/2014 0910   CHOLHDL 4 11/25/2014 0910   VLDL 14.0 11/25/2014 0910   LDLCALC 140* 11/25/2014 0910    CBC    Component Value Date/Time   WBC 8.1 11/25/2014 0910   RBC 4.08 11/25/2014 0910   HGB 12.3 11/25/2014 0910   HCT 36.2 11/25/2014 0910   PLT 334.0 11/25/2014 0910   MCV 88.6 11/25/2014 0910   MCHC 33.9 11/25/2014 0910   RDW 13.0 11/25/2014 0910    Hgb A1C No results found for: HGBA1C     Assessment & Plan:  Low back pain, right hip pain s/p fall:  Continue ice, heat and heating pad Continue Robaxin and Ibuprofen  She does not want to try PT at this time RX given for Tramadol for severe pain  RTC as needed or if symptoms persist or worsen

## 2015-03-18 ENCOUNTER — Other Ambulatory Visit: Payer: Self-pay | Admitting: Internal Medicine

## 2015-03-18 NOTE — Telephone Encounter (Signed)
Medication sent electronically 

## 2015-03-18 NOTE — Telephone Encounter (Signed)
Recieved an electronic refill request for Zoloft. Last refilled 11/25/14 for #30 with 3 refills. Ok to refill?

## 2015-06-16 ENCOUNTER — Ambulatory Visit (INDEPENDENT_AMBULATORY_CARE_PROVIDER_SITE_OTHER): Payer: BLUE CROSS/BLUE SHIELD | Admitting: Nurse Practitioner

## 2015-06-16 ENCOUNTER — Encounter: Payer: Self-pay | Admitting: Nurse Practitioner

## 2015-06-16 VITALS — BP 118/60 | HR 86 | Temp 98.0°F | Wt 163.8 lb

## 2015-06-16 DIAGNOSIS — M791 Myalgia, unspecified site: Secondary | ICD-10-CM

## 2015-06-16 DIAGNOSIS — J069 Acute upper respiratory infection, unspecified: Secondary | ICD-10-CM

## 2015-06-16 DIAGNOSIS — B9789 Other viral agents as the cause of diseases classified elsewhere: Secondary | ICD-10-CM

## 2015-06-16 LAB — POCT INFLUENZA A/B
INFLUENZA A, POC: NEGATIVE
INFLUENZA B, POC: NEGATIVE

## 2015-06-16 MED ORDER — AZITHROMYCIN 250 MG PO TABS: ORAL_TABLET | ORAL | Status: DC

## 2015-06-16 MED ORDER — HYDROCOD POLST-CPM POLST ER 10-8 MG/5ML PO SUER: 5.0000 mL | Freq: Every evening | ORAL | Status: DC | PRN

## 2015-06-16 MED ORDER — METHYLPREDNISOLONE 4 MG PO TABS: ORAL_TABLET | ORAL | Status: DC

## 2015-06-16 NOTE — Progress Notes (Signed)
Patient ID: Robin West, female    DOB: 1981/01/04  Age: 34 y.o. MRN: [id number]  CC: Cough   HPI Robin West presents for CC of cough x 4 days.   1) Pt reports cough and sore throat.  HA- migraine medication not helpful  Hot and cold intermittently, subjective fever Nausea- comes in waves, no appetite  Body aches  Sick contacts- 2 children (croup)  Treatment to date: Tylenol this morning at 0645  Mucinex and Benadryl   Nothing has been helpful long term  Phenergan at home for migraines, but patient has not taken any recently.   History Robin West has a past medical history of Chicken pox; Frequent headaches; and Allergy.   Robin West has past surgical history that includes Tonsillectomy (2010); Hemorroidectomy (2004); and Wisdom tooth extraction.   Robin West family history includes Arthritis in Robin West father and maternal grandmother; Cancer in Robin West paternal aunt and paternal grandmother; Hyperlipidemia in Robin West father; Hypertension in Robin West father; Stroke in Robin West maternal grandfather.Robin West reports that Robin West has never smoked. Robin West has never used smokeless tobacco. Robin West reports that Robin West drinks alcohol. Robin West reports that Robin West does not use illicit drugs.  Outpatient Prescriptions Prior to Visit  Medication Sig Dispense Refill  . HYDROcodone-acetaminophen (NORCO) 10-325 MG per tablet Take 1 tablet by mouth every 8 (eight) hours as needed. 30 tablet 0  . LO LOESTRIN FE 1 MG-10 MCG / 10 MCG tablet Take 1 tablet by mouth daily.  12  . Multiple Vitamin (MULTIVITAMIN) tablet Take 1 tablet by mouth daily.    Marland Kitchen PAZEO 0.7 % SOLN Place 1 drop into both eyes. For 14 days as needed for allergies  2  . Promethazine HCl (PHENERGAN PO) Take 1 tablet by mouth as needed.    . sertraline (ZOLOFT) 50 MG tablet TAKE 1 TABLET BY MOUTH DAILY 30 tablet 3  . SUMAtriptan (IMITREX) 100 MG tablet Take 1 tablet by mouth. For migraine may repeat in 2hrs if not better Max 2/24hr  2  . methocarbamol (ROBAXIN) 500 MG tablet Take 1 tablet  (500 mg total) by mouth 3 (three) times daily. (Patient not taking: Reported on 06/16/2015) 30 tablet 0  . traMADol (ULTRAM) 50 MG tablet Take 1 tablet (50 mg total) by mouth every 8 (eight) hours as needed. (Patient not taking: Reported on 06/16/2015) 30 tablet 0   No facility-administered medications prior to visit.    ROS Review of Systems  Constitutional: Positive for fever, chills, diaphoresis and fatigue.  HENT: Positive for congestion, ear pain, postnasal drip, rhinorrhea, sinus pressure, sneezing and sore throat. Negative for ear discharge, hearing loss, mouth sores, nosebleeds and trouble swallowing.   Respiratory: Positive for cough. Negative for chest tightness, shortness of breath and wheezing.   Gastrointestinal: Positive for nausea. Negative for vomiting and diarrhea.  Musculoskeletal: Positive for myalgias.  Neurological: Positive for light-headedness and headaches.    Objective:  BP 118/60 mmHg  Pulse 86  Temp(Src) 98 F (36.7 C) (Oral)  Wt 163 lb 12.8 oz (74.299 kg)  SpO2 96%  Physical Exam  Constitutional: Robin West is oriented to person, place, and time. Robin West appears well-developed and well-nourished. No distress.  HENT:  Head: Normocephalic and atraumatic.  Right Ear: External ear normal.  Left Ear: External ear normal.  Mouth/Throat: No oropharyngeal exudate.  TM's clear bilaterally, slight bulge, no retraction Nasal mucosa edematous  Eyes: EOM are normal. Pupils are equal, round, and reactive to light. Right eye exhibits no discharge. Left eye exhibits no discharge. No scleral icterus.  Neck: Normal range of motion. Neck supple.  Cardiovascular: Normal rate, regular rhythm and normal heart sounds.  Exam reveals no gallop and no friction rub.   No murmur heard. Pulmonary/Chest: Effort normal and breath sounds normal. No respiratory distress. Robin West has no wheezes. Robin West has no rales. Robin West exhibits no tenderness.  Lymphadenopathy:    Robin West has no cervical adenopathy.   Neurological: Robin West is alert and oriented to person, place, and time. No cranial nerve deficit. Robin West exhibits normal muscle tone. Coordination normal.  Skin: Skin is warm and dry. No rash noted. Robin West is not diaphoretic.  Psychiatric: Robin West has a normal mood and affect. Robin West behavior is normal. Judgment and thought content normal.   Assessment & Plan:   Robin West was seen today for cough.  Diagnoses and all orders for this visit:  Myalgia -     POCT Influenza A/B  Viral URI with cough -     POCT Influenza A/B  Other orders -     methylPREDNISolone (MEDROL) 4 MG tablet; Take 6 tablets by mouth with breakfast or lunch and decrease by 1 tablet each day until gone. -     chlorpheniramine-HYDROcodone (TUSSIONEX PENNKINETIC ER) 10-8 MG/5ML SUER; Take 5 mLs by mouth at bedtime as needed for cough. -     azithromycin (ZITHROMAX) 250 MG tablet; Take 2 tablets by mouth on day 1, take 1 tablet by mouth each day after for 4 days.   I have discontinued Robin West methocarbamol and traMADol. I am also having Robin West start on methylPREDNISolone, chlorpheniramine-HYDROcodone, and azithromycin. Additionally, I am having Robin West maintain Robin West SUMAtriptan, PAZEO, LO LOESTRIN FE, Promethazine HCl (PHENERGAN PO), multivitamin, HYDROcodone-acetaminophen, and sertraline.  Meds ordered this encounter  Medications  . methylPREDNISolone (MEDROL) 4 MG tablet    Sig: Take 6 tablets by mouth with breakfast or lunch and decrease by 1 tablet each day until gone.    Dispense:  21 tablet    Refill:  0    Order Specific Question:  Supervising Provider    Answer:  Deborra Medina L [[id number]]  . chlorpheniramine-HYDROcodone (TUSSIONEX PENNKINETIC ER) 10-8 MG/5ML SUER    Sig: Take 5 mLs by mouth at bedtime as needed for cough.    Dispense:  115 mL    Refill:  0    Order Specific Question:  Supervising Provider    Answer:  Derrel Nip, TERESA L [[id number]]  . azithromycin (ZITHROMAX) 250 MG tablet    Sig: Take 2 tablets by mouth on day 1, take 1  tablet by mouth each day after for 4 days.    Dispense:  6 each    Refill:  0    Order Specific Question:  Supervising Provider    Answer:  Crecencio Mc [[id number]]     Follow-up: Return if symptoms worsen or fail to improve.

## 2015-06-16 NOTE — Patient Instructions (Signed)
5 mL (1 teaspoon) of cough syrup. Don't drive or make important decisions while taking this....just sleep and rest.   Z-pack only if needed! Take the prednisone first to see if helpful.  Prednisone with breakfast or lunch at the latest.  6 tablets on day 1, 5 tablets on day 2, 4 tablets on day 3, 3 tablets on day 4, 2 tablets day 5, 1 tablet on day 6...done! Take tablets all together not spaced out Don't take with NSAIDs (Ibuprofen, Aleve, Naproxen, Meloxicam ect...)

## 2015-06-16 NOTE — Assessment & Plan Note (Signed)
POCT Flu negative Z-pack for later use if needed, asked her to use prednisone taper first Prednisone taper for ENT symptoms and cough Tussionex with instructions verbal and written  Pt was made aware not to take Norco with the cough syrup or phenergan with this.   FU prn worsening/failure to improve.

## 2015-07-27 ENCOUNTER — Other Ambulatory Visit: Payer: Self-pay | Admitting: Internal Medicine

## 2015-09-16 ENCOUNTER — Ambulatory Visit (INDEPENDENT_AMBULATORY_CARE_PROVIDER_SITE_OTHER): Payer: BLUE CROSS/BLUE SHIELD | Admitting: Internal Medicine

## 2015-09-16 ENCOUNTER — Encounter: Payer: Self-pay | Admitting: Internal Medicine

## 2015-09-16 VITALS — BP 104/80 | HR 74 | Temp 98.7°F | Wt 166.0 lb

## 2015-09-16 DIAGNOSIS — R52 Pain, unspecified: Secondary | ICD-10-CM | POA: Diagnosis not present

## 2015-09-16 DIAGNOSIS — J069 Acute upper respiratory infection, unspecified: Secondary | ICD-10-CM

## 2015-09-16 LAB — POCT INFLUENZA A/B
INFLUENZA A, POC: NEGATIVE
INFLUENZA B, POC: NEGATIVE

## 2015-09-16 MED ORDER — HYDROCOD POLST-CPM POLST ER 10-8 MG/5ML PO SUER: 5.0000 mL | Freq: Two times a day (BID) | ORAL | Status: DC

## 2015-09-16 NOTE — Progress Notes (Signed)
HPI  Pt presents to the clinic today with c/o ear fullness, nasal congestion, sore throat and cough. This started yesterday. She is blowing blood tinged yellow mucous out of her nose. She denies difficulty swallowing. The cough is non productive. The cough is keeping her up at night. She denies fever but has had chills and body aches. She has take Tylenol without any relief. She has a history of seasonal allergies. She has not had sick contacts. She did not get her flu shot this year.  Review of Systems    Past Medical History  Diagnosis Date  . Chicken pox   . Frequent headaches   . Allergy     Family History  Problem Relation Age of Onset  . Arthritis Father   . Hyperlipidemia Father   . Hypertension Father   . Arthritis Maternal Grandmother   . Stroke Maternal Grandfather   . Cancer Paternal Grandmother     Ovarian   . Cancer Paternal Aunt     ovarian cancer    Social History   Social History  . Marital Status: Married    Spouse Name: N/A  . Number of Children: N/A  . Years of Education: N/A   Occupational History  . Not on file.   Social History Main Topics  . Smoking status: Never Smoker   . Smokeless tobacco: Never Used  . Alcohol Use: 0.0 oz/week    0 Standard drinks or equivalent per week     Comment: rare  . Drug Use: No  . Sexual Activity: Yes   Other Topics Concern  . Not on file   Social History Narrative    No Known Allergies   Constitutional: Positive headache, fatigue. Denies fever or abrupt weight changes.  HEENT:  Positive nasal congestion and sore throat. Denies eye redness, ear pain, ringing in the ears, wax buildup, runny nose or bloody nose. Respiratory:  Pt reports cough. Denies difficulty breathing or shortness of breath.  Cardiovascular: Denies chest pain, chest tightness, palpitations or swelling in the hands or feet.   No other specific complaints in a complete review of systems (except as listed in HPI above).  Objective:    BP 104/80 mmHg  Pulse 74  Temp(Src) 98.7 F (37.1 C) (Oral)  Wt 166 lb (75.297 kg)  SpO2 98%  LMP 08/15/2015  General: Appears her stated age,  in NAD. HEENT: Head: normal shape and size, no sinus tenderness noted; Eyes: sclera white, no icterus, conjunctiva pink; Ears: Tm's gray and intact, dull light reflex; Nose: mucosa opinkand moist, septum midline; Throat/Mouth: + PND. Teeth present, mucosa pink and moist, no exudate noted, no lesions or ulcerations noted.  Neck:  No adenopathy noted.  Cardiovascular: Normal rate and rhythm. S1,S2 noted.  No murmur, rubs or gallops noted.  Pulmonary/Chest: Normal effort and positive vesicular breath sounds. No respiratory distress. No wheezes, rales or ronchi noted.      Assessment & Plan:   Viral URI  Rapid Flu: negative Can use a Neti Pot which can be purchased from your local drug store. Flonase 2 sprays each nostril for 3 days and then as needed. Ibuprofen and salt water gargles for sore throat RX for Tussionex for cough  RTC as needed or if symptoms persist.

## 2015-09-16 NOTE — Progress Notes (Signed)
Pre visit review using our clinic review tool, if applicable. No additional management support is needed unless otherwise documented below in the visit note. 

## 2015-09-16 NOTE — Addendum Note (Signed)
Addended by: Lurlean Nanny on: 09/16/2015 04:31 PM   Modules accepted: Orders

## 2015-09-16 NOTE — Patient Instructions (Signed)

## 2015-09-23 ENCOUNTER — Ambulatory Visit (INDEPENDENT_AMBULATORY_CARE_PROVIDER_SITE_OTHER): Payer: BLUE CROSS/BLUE SHIELD

## 2015-09-23 DIAGNOSIS — Z23 Encounter for immunization: Secondary | ICD-10-CM | POA: Diagnosis not present

## 2015-10-16 ENCOUNTER — Ambulatory Visit (INDEPENDENT_AMBULATORY_CARE_PROVIDER_SITE_OTHER): Payer: BLUE CROSS/BLUE SHIELD | Admitting: Internal Medicine

## 2015-10-16 ENCOUNTER — Encounter: Payer: Self-pay | Admitting: Internal Medicine

## 2015-10-16 VITALS — BP 118/72 | HR 88 | Temp 98.5°F | Wt 166.2 lb

## 2015-10-16 DIAGNOSIS — G43C0 Periodic headache syndromes in child or adult, not intractable: Secondary | ICD-10-CM

## 2015-10-16 DIAGNOSIS — G43909 Migraine, unspecified, not intractable, without status migrainosus: Secondary | ICD-10-CM | POA: Diagnosis not present

## 2015-10-16 DIAGNOSIS — J301 Allergic rhinitis due to pollen: Secondary | ICD-10-CM

## 2015-10-16 MED ORDER — KETOROLAC TROMETHAMINE 30 MG/ML IJ SOLN
30.0000 mg | Freq: Once | INTRAMUSCULAR | Status: AC
  Administered 2015-10-16: 30 mg via INTRAMUSCULAR

## 2015-10-16 MED ORDER — ONDANSETRON HCL 4 MG/2ML IJ SOLN
2.0000 mg | Freq: Once | INTRAMUSCULAR | Status: AC
  Administered 2015-10-16: 2 mg via INTRAMUSCULAR

## 2015-10-16 NOTE — Progress Notes (Signed)
Pre visit review using our clinic review tool, if applicable. No additional management support is needed unless otherwise documented below in the visit note. 

## 2015-10-16 NOTE — Progress Notes (Signed)
HPI  Pt presents to the clinic today with c/osore throat. This started 3 days ago. She denies difficulty swallowing. This morning, she reports she vomited twice, was febrile, started loosing her voice and felt much worse. She also reports a dry, non-productive cough and body aches. She has not taken anything OTC. She has not tried anything OTC. She has had sick contacts. She did get her flu shot.   In addition,she has had a migraine for the past 2 days. This is her typical migraine, located in her forehead. She describes the pain as throbbing. She has sensitivity to light and sound. She has nausea and vomiting. She has taken 2 Imitrex with no relief.   Review of Systems    Past Medical History  Diagnosis Date  . Chicken pox   . Frequent headaches   . Allergy     Family History  Problem Relation Age of Onset  . Arthritis Father   . Hyperlipidemia Father   . Hypertension Father   . Arthritis Maternal Grandmother   . Stroke Maternal Grandfather   . Cancer Paternal Grandmother     Ovarian   . Cancer Paternal Aunt     ovarian cancer    Social History   Social History  . Marital Status: Married    Spouse Name: N/A  . Number of Children: N/A  . Years of Education: N/A   Occupational History  . Not on file.   Social History Main Topics  . Smoking status: Never Smoker   . Smokeless tobacco: Never Used  . Alcohol Use: 0.0 oz/week    0 Standard drinks or equivalent per week     Comment: rare  . Drug Use: No  . Sexual Activity: Yes   Other Topics Concern  . Not on file   Social History Narrative    No Known Allergies   Constitutional: Positive headache, fatigue and fever. Denies abrupt weight changes.  HEENT:  Positive eye pain, facial pain, nasal congestion and sore throat. Denies eye redness, ear pain, ringing in the ears, wax buildup, runny nose or bloody nose. Respiratory: Positive cough. Denies difficulty breathing or shortness of breath.  Cardiovascular: Denies  chest pain, chest tightness, palpitations or swelling in the hands or feet.  GI: Positive nausea and vomiting. Denies diarrhea, constipation or blood in her stool.  No other specific complaints in a complete review of systems (except as listed in HPI above).  Objective:  BP 118/72 mmHg  Pulse 88  Temp(Src) 98.5 F (36.9 C) (Oral)  Wt 166 lb 4 oz (75.411 kg)  SpO2 97%  LMP 10/02/2015   General: Appears her stated age,  in NAD. HEENT: Head: normal shape and size, frontal sinus tenderness noted; Eyes: sclera white, no icterus, conjunctiva pink; Ears: Tm's gray and intact, normal light reflex; Nose: mucosa boggy and moist, septum midline; Throat/Mouth: + PND. Teeth present, mucosa erythematous and moist, no exudate noted, no lesions or ulcerations noted.  Neck:  No adenopathy noted.  Cardiovascular: Normal rate and rhythm. S1,S2 noted.  No murmur, rubs or gallops noted.  Pulmonary/Chest: Normal effort and positive vesicular breath sounds. No respiratory distress. No wheezes, rales or ronchi noted.      Assessment & Plan:   Allergic rhinitis:  Flonase 2 sprays each nostril for 3 days and then as needed. Zyrtec Daily  Migraine:  Zofran 4 mg IM Toradol 30 mg IM   RTC as needed or if symptoms persist.

## 2015-10-16 NOTE — Patient Instructions (Signed)
Allergic Rhinitis Allergic rhinitis is when the mucous membranes in the nose respond to allergens. Allergens are particles in the air that cause your body to have an allergic reaction. This causes you to release allergic antibodies. Through a chain of events, these eventually cause you to release histamine into the blood stream. Although meant to protect the body, it is this release of histamine that causes your discomfort, such as frequent sneezing, congestion, and an itchy, runny nose.  CAUSES Seasonal allergic rhinitis (hay fever) is caused by pollen allergens that may come from grasses, trees, and weeds. Year-round allergic rhinitis (perennial allergic rhinitis) is caused by allergens such as house dust mites, pet dander, and mold spores. SYMPTOMS  Nasal stuffiness (congestion).  Itchy, runny nose with sneezing and tearing of the eyes. DIAGNOSIS Your health care provider can help you determine the allergen or allergens that trigger your symptoms. If you and your health care provider are unable to determine the allergen, skin or blood testing may be used. Your health care provider will diagnose your condition after taking your health history and performing a physical exam. Your health care provider may assess you for other related conditions, such as asthma, pink eye, or an ear infection. TREATMENT Allergic rhinitis does not have a cure, but it can be controlled by:  Medicines that block allergy symptoms. These may include allergy shots, nasal sprays, and oral antihistamines.  Avoiding the allergen. Hay fever may often be treated with antihistamines in pill or nasal spray forms. Antihistamines block the effects of histamine. There are over-the-counter medicines that may help with nasal congestion and swelling around the eyes. Check with your health care provider before taking or giving this medicine. If avoiding the allergen or the medicine prescribed do not work, there are many new medicines  your health care provider can prescribe. Stronger medicine may be used if initial measures are ineffective. Desensitizing injections can be used if medicine and avoidance does not work. Desensitization is when a patient is given ongoing shots until the body becomes less sensitive to the allergen. Make sure you follow up with your health care provider if problems continue. HOME CARE INSTRUCTIONS It is not possible to completely avoid allergens, but you can reduce your symptoms by taking steps to limit your exposure to them. It helps to know exactly what you are allergic to so that you can avoid your specific triggers. SEEK MEDICAL CARE IF:  You have a fever.  You develop a cough that does not stop easily (persistent).  You have shortness of breath.  You start wheezing.  Symptoms interfere with normal daily activities.   This information is not intended to replace advice given to you by your health care provider. Make sure you discuss any questions you have with your health care provider.   Document Released: 03/16/2001 Document Revised: 07/12/2014 Document Reviewed: 02/26/2013 Elsevier Interactive Patient Education 2016 [employer]  

## 2015-10-18 DIAGNOSIS — J069 Acute upper respiratory infection, unspecified: Secondary | ICD-10-CM | POA: Diagnosis not present

## 2015-11-20 DIAGNOSIS — G43719 Chronic migraine without aura, intractable, without status migrainosus: Secondary | ICD-10-CM | POA: Diagnosis not present

## 2015-11-25 DIAGNOSIS — G43719 Chronic migraine without aura, intractable, without status migrainosus: Secondary | ICD-10-CM | POA: Diagnosis not present

## 2015-12-03 ENCOUNTER — Other Ambulatory Visit: Payer: BLUE CROSS/BLUE SHIELD

## 2015-12-10 ENCOUNTER — Ambulatory Visit (INDEPENDENT_AMBULATORY_CARE_PROVIDER_SITE_OTHER): Payer: BLUE CROSS/BLUE SHIELD | Admitting: Internal Medicine

## 2015-12-10 ENCOUNTER — Encounter: Payer: Self-pay | Admitting: Internal Medicine

## 2015-12-10 VITALS — BP 112/70 | HR 86 | Temp 98.8°F | Ht 66.0 in | Wt 172.0 lb

## 2015-12-10 DIAGNOSIS — Z0001 Encounter for general adult medical examination with abnormal findings: Secondary | ICD-10-CM | POA: Diagnosis not present

## 2015-12-10 DIAGNOSIS — M67432 Ganglion, left wrist: Secondary | ICD-10-CM

## 2015-12-10 DIAGNOSIS — Z0000 Encounter for general adult medical examination without abnormal findings: Secondary | ICD-10-CM

## 2015-12-10 DIAGNOSIS — G43719 Chronic migraine without aura, intractable, without status migrainosus: Secondary | ICD-10-CM | POA: Diagnosis not present

## 2015-12-10 LAB — COMPREHENSIVE METABOLIC PANEL
ALK PHOS: 42 U/L (ref 39–117)
ALT: 15 U/L (ref 0–35)
AST: 14 U/L (ref 0–37)
Albumin: 4.2 g/dL (ref 3.5–5.2)
BUN: 22 mg/dL (ref 6–23)
CHLORIDE: 103 meq/L (ref 96–112)
CO2: 27 meq/L (ref 19–32)
Calcium: 9.3 mg/dL (ref 8.4–10.5)
Creatinine, Ser: 0.58 mg/dL (ref 0.40–1.20)
GFR: 124.29 mL/min (ref >60.00)
GLUCOSE: 112 mg/dL — AB (ref 70–99)
POTASSIUM: 3.5 meq/L (ref 3.5–5.1)
SODIUM: 137 meq/L (ref 135–145)
TOTAL PROTEIN: 7.1 g/dL (ref 6.0–8.3)
Total Bilirubin: 0.2 mg/dL (ref 0.2–1.2)

## 2015-12-10 LAB — CBC
HEMATOCRIT: 34.6 % — AB (ref 36.0–46.0)
HEMOGLOBIN: 11.6 g/dL — AB (ref 12.0–15.0)
MCHC: 33.7 g/dL (ref 30.0–36.0)
MCV: 89.1 fl (ref 78.0–100.0)
Platelets: 347 10*3/uL (ref 150.0–400.0)
RBC: 3.88 Mil/uL (ref 3.87–5.11)
RDW: 12.8 % (ref 11.5–15.5)
WBC: 8.3 10*3/uL (ref 4.0–10.5)

## 2015-12-10 LAB — LIPID PANEL
CHOL/HDL RATIO: 4
Cholesterol: 210 mg/dL — ABNORMAL HIGH (ref 0–200)
HDL: 47.8 mg/dL (ref >39.00)
LDL CALC: 138 mg/dL — AB (ref 0–99)
NONHDL: 161.74
Triglycerides: 119 mg/dL (ref 0.0–149.0)
VLDL: 23.8 mg/dL (ref 0.0–40.0)

## 2015-12-10 NOTE — Progress Notes (Signed)
Subjective:    Patient ID: Robin West, female    DOB: October 02, 1980, 35 y.o.   MRN: [id number]  HPI  Pt presents to the clinic today for her annual exam.  Flu: 09/2015 Tetanus: 2014 Pap Smear: 11/2015 Dentist: biannually  Diet: She does eat meat. She consumes fruits and veggies daily. She tries to avoid fried food. She drinks mostly water. Exercise: None  She is also concerned about a ganglion cyst on her left wrist.. It has been there for a while, but it is starting to cause pain. She denies numbness and tingling of her left hand. She has tried smashing it with a book. She wants to know what else can be done about it.  Review of Systems      Past Medical History  Diagnosis Date  . Chicken pox   . Frequent headaches   . Allergy     Current Outpatient Prescriptions  Medication Sig Dispense Refill  . LO LOESTRIN FE 1 MG-10 MCG / 10 MCG tablet Take 1 tablet by mouth daily.  12  . Multiple Vitamin (MULTIVITAMIN) tablet Take 1 tablet by mouth daily.    Marland Kitchen PAZEO 0.7 % SOLN Place 1 drop into both eyes. For 14 days as needed for allergies  2  . Promethazine HCl (PHENERGAN PO) Take 1 tablet by mouth as needed.    . sertraline (ZOLOFT) 50 MG tablet Take 1 tablet (50 mg total) by mouth daily. SCHEDULE ANNUAL PHYSICAL FOR June 2017 30 tablet 4  . SUMAtriptan (IMITREX) 100 MG tablet Take 1 tablet by mouth. For migraine may repeat in 2hrs if not better Max 2/24hr  2   No current facility-administered medications for this visit.    No Known Allergies  Family History  Problem Relation Age of Onset  . Arthritis Father   . Hyperlipidemia Father   . Hypertension Father   . Arthritis Maternal Grandmother   . Stroke Maternal Grandfather   . Cancer Paternal Grandmother     Ovarian   . Cancer Paternal Aunt     ovarian cancer    Social History   Social History  . Marital Status: Married    Spouse Name: N/A  . Number of Children: N/A  . Years of Education: N/A   Occupational  History  . Not on file.   Social History Main Topics  . Smoking status: Never Smoker   . Smokeless tobacco: Never Used  . Alcohol Use: 0.0 oz/week    0 Standard drinks or equivalent per week     Comment: rare  . Drug Use: No  . Sexual Activity: Yes   Other Topics Concern  . Not on file   Social History Narrative     Constitutional: Denies fever, malaise, fatigue, headache or abrupt weight changes.  HEENT: Denies eye pain, eye redness, ear pain, ringing in the ears, wax buildup, runny nose, nasal congestion, bloody nose, or sore throat. Respiratory: Denies difficulty breathing, shortness of breath, cough or sputum production.   Cardiovascular: Denies chest pain, chest tightness, palpitations or swelling in the hands or feet.  Gastrointestinal: Denies abdominal pain, bloating, constipation, diarrhea or blood in the stool.  GU: Denies urgency, frequency, pain with urination, burning sensation, blood in urine, odor or discharge. Musculoskeletal: Denies decrease in range of motion, difficulty with gait, muscle pain or joint pain and swelling.  Skin: Pt reports ganglion cyst of left wrist. Denies redness, rashes, or ulcercations.  Neurological: Denies dizziness, difficulty with memory, difficulty with speech or  problems with balance and coordination.  Psych: Denies anxiety, depression, SI/HI.  No other specific complaints in a complete review of systems (except as listed in HPI above).  Objective:   Physical Exam  BP 112/70 mmHg  Pulse 86  Temp(Src) 98.8 F (37.1 C) (Oral)  Ht 5\' 6"  (1.676 m)  Wt 172 lb (78.019 kg)  BMI 27.77 kg/m2  SpO2 98%  LMP 11/30/2015  Wt Readings from Last 3 Encounters:  10/16/15 166 lb 4 oz (75.411 kg)  09/16/15 166 lb (75.297 kg)  06/16/15 163 lb 12.8 oz (74.299 kg)    General: Appears her stated age, well developed, well nourished in NAD. Skin: Warm, dry and intact. 1 cm oval ganglion cyst noted of dorsal surface, left wrist. HEENT: Head:  normal shape and size; Eyes: sclera white, no icterus, conjunctiva pink, PERRLA and EOMs intact; Ears: Tm's gray and intact, normal light reflex; Throat/Mouth: Teeth present, mucosa pink and moist, no exudate, lesions or ulcerations noted.  Neck:  Neck supple, trachea midline. No masses, lumps or thyromegaly present.  Cardiovascular: Normal rate and rhythm. S1,S2 noted.  No murmur, rubs or gallops noted. No JVD or BLE edema.  Pulmonary/Chest: Normal effort and positive vesicular breath sounds. No respiratory distress. No wheezes, rales or ronchi noted.  Abdomen: Soft and nontender. Normal bowel sounds. No distention or masses noted. Liver, spleen and kidneys non palpable. Musculoskeletal: Strength 5/5 BUE/BLE. No signs of joint swelling. No difficulty with gait.  Neurological: Alert and oriented. Cranial nerves II-XII grossly intact. Coordination normal.  Psychiatric: Mood and affect normal. Behavior is normal. Judgment and thought content normal.    BMET    Component Value Date/Time   NA 136 11/25/2014 0910   K 3.7 11/25/2014 0910   CL 105 11/25/2014 0910   CO2 23 11/25/2014 0910   GLUCOSE 96 11/25/2014 0910   BUN 22 11/25/2014 0910   CREATININE 0.52 11/25/2014 0910   CALCIUM 9.0 11/25/2014 0910    Lipid Panel     Component Value Date/Time   CHOL 201* 11/25/2014 0910   TRIG 70.0 11/25/2014 0910   HDL 47.10 11/25/2014 0910   CHOLHDL 4 11/25/2014 0910   VLDL 14.0 11/25/2014 0910   LDLCALC 140* 11/25/2014 0910    CBC    Component Value Date/Time   WBC 8.1 11/25/2014 0910   RBC 4.08 11/25/2014 0910   HGB 12.3 11/25/2014 0910   HCT 36.2 11/25/2014 0910   PLT 334.0 11/25/2014 0910   MCV 88.6 11/25/2014 0910   MCHC 33.9 11/25/2014 0910   RDW 13.0 11/25/2014 0910    Hgb A1C No results found for: HGBA1C       Assessment & Plan:   Preventative Health Maintenance:  Flu and Tetanus UTD Pap Smear UTD Discussed mammogram screening at age 45 Encouraged her to continue to  see a dentist annually Will check CBC, CMET and Lipid Profile today  Ganglion cyst left wrist:  Referral placed to hand surgery-see Robin West on the way out to schedule  RTC in 1 year, sooner if needed

## 2015-12-10 NOTE — Progress Notes (Signed)
Pre visit review using our clinic review tool, if applicable. No additional management support is needed unless otherwise documented below in the visit note. 

## 2015-12-10 NOTE — Patient Instructions (Signed)
Health Maintenance, Female Adopting a healthy lifestyle and getting preventive care can go a long way to promote health and wellness. Talk with your health care provider about what schedule of regular examinations is right for you. This is a good chance for you to check in with your provider about disease prevention and staying healthy. In between checkups, there are plenty of things you can do on your own. Experts have done a lot of research about which lifestyle changes and preventive measures are most likely to keep you healthy. Ask your health care provider for more information. WEIGHT AND DIET  Eat a healthy diet  Be sure to include plenty of vegetables, fruits, low-fat dairy products, and lean protein.  Do not eat a lot of foods high in solid fats, added sugars, or salt.  Get regular exercise. This is one of the most important things you can do for your health.  Most adults should exercise for at least 150 minutes each week. The exercise should increase your heart rate and make you sweat (moderate-intensity exercise).  Most adults should also do strengthening exercises at least twice a week. This is in addition to the moderate-intensity exercise.  Maintain a healthy weight  Body mass index (BMI) is a measurement that can be used to identify possible weight problems. It estimates body fat based on height and weight. Your health care provider can help determine your BMI and help you achieve or maintain a healthy weight.  For females 45 years of age and older:   A BMI below 18.5 is considered underweight.  A BMI of 18.5 to 24.9 is normal.  A BMI of 25 to 29.9 is considered overweight.  A BMI of 30 and above is considered obese.  Watch levels of cholesterol and blood lipids  You should start having your blood tested for lipids and cholesterol at 37 years of age, then have this test every 5 years.  You may need to have your cholesterol levels checked more often if:  Your lipid  or cholesterol levels are high.  You are older than 37 years of age.  You are at high risk for heart disease.  CANCER SCREENING   Lung Cancer  Lung cancer screening is recommended for adults 51-57 years old who are at high risk for lung cancer because of a history of smoking.  A yearly low-dose CT scan of the lungs is recommended for people who:  Currently smoke.  Have quit within the past 15 years.  Have at least a 30-pack-year history of smoking. A pack year is smoking an average of one pack of cigarettes a day for 1 year.  Yearly screening should continue until it has been 15 years since you quit.  Yearly screening should stop if you develop a health problem that would prevent you from having lung cancer treatment.  Breast Cancer  Practice breast self-awareness. This means understanding how your breasts normally appear and feel.  It also means doing regular breast self-exams. Let your health care provider know about any changes, no matter how small.  If you are in your 20s or 30s, you should have a clinical breast exam (CBE) by a health care provider every 1-3 years as part of a regular health exam.  If you are 28 or older, have a CBE every year. Also consider having a breast X-ray (mammogram) every year.  If you have a family history of breast cancer, talk to your health care provider about genetic screening.  If you  are at high risk for breast cancer, talk to your health care provider about having an MRI and a mammogram every year.  Breast cancer gene (BRCA) assessment is recommended for women who have family members with BRCA-related cancers. BRCA-related cancers include:  Breast.  Ovarian.  Tubal.  Peritoneal cancers.  Results of the assessment will determine the need for genetic counseling and BRCA1 and BRCA2 testing. Cervical Cancer Your health care provider may recommend that you be screened regularly for cancer of the pelvic organs (ovaries, uterus, and  vagina). This screening involves a pelvic examination, including checking for microscopic changes to the surface of your cervix (Pap test). You may be encouraged to have this screening done every 3 years, beginning at age 46.  For women ages 18-65, health care providers may recommend pelvic exams and Pap testing every 3 years, or they may recommend the Pap and pelvic exam, combined with testing for human papilloma virus (HPV), every 5 years. Some types of HPV increase your risk of cervical cancer. Testing for HPV may also be done on women of any age with unclear Pap test results.  Other health care providers may not recommend any screening for nonpregnant women who are considered low risk for pelvic cancer and who do not have symptoms. Ask your health care provider if a screening pelvic exam is right for you.  If you have had past treatment for cervical cancer or a condition that could lead to cancer, you need Pap tests and screening for cancer for at least 20 years after your treatment. If Pap tests have been discontinued, your risk factors (such as having a new sexual partner) need to be reassessed to determine if screening should resume. Some women have medical problems that increase the chance of getting cervical cancer. In these cases, your health care provider may recommend more frequent screening and Pap tests. Colorectal Cancer  This type of cancer can be detected and often prevented.  Routine colorectal cancer screening usually begins at 37 years of age and continues through 37 years of age.  Your health care provider may recommend screening at an earlier age if you have risk factors for colon cancer.  Your health care provider may also recommend using home test kits to check for hidden blood in the stool.  A small camera at the end of a tube can be used to examine your colon directly (sigmoidoscopy or colonoscopy). This is done to check for the earliest forms of colorectal  cancer.  Routine screening usually begins at age 58.  Direct examination of the colon should be repeated every 5-10 years through 37 years of age. However, you may need to be screened more often if early forms of precancerous polyps or small growths are found. Skin Cancer  Check your skin from head to toe regularly.  Tell your health care provider about any new moles or changes in moles, especially if there is a change in a mole's shape or color.  Also tell your health care provider if you have a mole that is larger than the size of a pencil eraser.  Always use sunscreen. Apply sunscreen liberally and repeatedly throughout the day.  Protect yourself by wearing long sleeves, pants, a wide-brimmed hat, and sunglasses whenever you are outside. HEART DISEASE, DIABETES, AND HIGH BLOOD PRESSURE   High blood pressure causes heart disease and increases the risk of stroke. High blood pressure is more likely to develop in:  People who have blood pressure in the high end  of the normal range (130-139/85-89 mm Hg).  People who are overweight or obese.  People who are African American.  If you are 38-23 years of age, have your blood pressure checked every 3-5 years. If you are 61 years of age or older, have your blood pressure checked every year. You should have your blood pressure measured twice--once when you are at a hospital or clinic, and once when you are not at a hospital or clinic. Record the average of the two measurements. To check your blood pressure when you are not at a hospital or clinic, you can use:  An automated blood pressure machine at a pharmacy.  A home blood pressure monitor.  If you are between 45 years and 39 years old, ask your health care provider if you should take aspirin to prevent strokes.  Have regular diabetes screenings. This involves taking a blood sample to check your fasting blood sugar level.  If you are at a normal weight and have a low risk for diabetes,  have this test once every three years after 37 years of age.  If you are overweight and have a high risk for diabetes, consider being tested at a younger age or more often. PREVENTING INFECTION  Hepatitis B  If you have a higher risk for hepatitis B, you should be screened for this virus. You are considered at high risk for hepatitis B if:  You were born in a country where hepatitis B is common. Ask your health care provider which countries are considered high risk.  Your parents were born in a high-risk country, and you have not been immunized against hepatitis B (hepatitis B vaccine).  You have HIV or AIDS.  You use needles to inject street drugs.  You live with someone who has hepatitis B.  You have had sex with someone who has hepatitis B.  You get hemodialysis treatment.  You take certain medicines for conditions, including cancer, organ transplantation, and autoimmune conditions. Hepatitis C  Blood testing is recommended for:  Everyone born from [birth year] through 1967.  Anyone with known risk factors for hepatitis C. Sexually transmitted infections (STIs)  You should be screened for sexually transmitted infections (STIs) including gonorrhea and chlamydia if:  You are sexually active and are younger than 37 years of age.  You are older than 37 years of age and your health care provider tells you that you are at risk for this type of infection.  Your sexual activity has changed since you were last screened and you are at an increased risk for chlamydia or gonorrhea. Ask your health care provider if you are at risk.  If you do not have HIV, but are at risk, it may be recommended that you take a prescription medicine daily to prevent HIV infection. This is called pre-exposure prophylaxis (PrEP). You are considered at risk if:  You are sexually active and do not regularly use condoms or know the HIV status of your partner(s).  You take drugs by injection.  You are sexually  active with a partner who has HIV. Talk with your health care provider about whether you are at high risk of being infected with HIV. If you choose to begin PrEP, you should first be tested for HIV. You should then be tested every 3 months for as long as you are taking PrEP.  PREGNANCY   If you are premenopausal and you may become pregnant, ask your health care provider about preconception counseling.  If you may  become pregnant, take 400 to 800 micrograms (mcg) of folic acid every day.  If you want to prevent pregnancy, talk to your health care provider about birth control (contraception). OSTEOPOROSIS AND MENOPAUSE   Osteoporosis is a disease in which the bones lose minerals and strength with aging. This can result in serious bone fractures. Your risk for osteoporosis can be identified using a bone density scan.  If you are 61 years of age or older, or if you are at risk for osteoporosis and fractures, ask your health care provider if you should be screened.  Ask your health care provider whether you should take a calcium or vitamin D supplement to lower your risk for osteoporosis.  Menopause may have certain physical symptoms and risks.  Hormone replacement therapy may reduce some of these symptoms and risks. Talk to your health care provider about whether hormone replacement therapy is right for you.  HOME CARE INSTRUCTIONS   Schedule regular health, dental, and eye exams.  Stay current with your immunizations.   Do not use any tobacco products including cigarettes, chewing tobacco, or electronic cigarettes.  If you are pregnant, do not drink alcohol.  If you are breastfeeding, limit how much and how often you drink alcohol.  Limit alcohol intake to no more than 1 drink per day for nonpregnant women. One drink equals 12 ounces of beer, 5 ounces of wine, or 1 ounces of hard liquor.  Do not use street drugs.  Do not share needles.  Ask your health care provider for help if  you need support or information about quitting drugs.  Tell your health care provider if you often feel depressed.  Tell your health care provider if you have ever been abused or do not feel safe at home.   This information is not intended to replace advice given to you by your health care provider. Make sure you discuss any questions you have with your health care provider.   Document Released: 01/04/2011 Document Revised: 07/12/2014 Document Reviewed: 05/23/2013 Elsevier Interactive Patient Education [employer].

## 2015-12-22 DIAGNOSIS — G43719 Chronic migraine without aura, intractable, without status migrainosus: Secondary | ICD-10-CM | POA: Diagnosis not present

## 2015-12-24 ENCOUNTER — Other Ambulatory Visit: Payer: Self-pay | Admitting: Internal Medicine

## 2015-12-24 DIAGNOSIS — M67432 Ganglion, left wrist: Secondary | ICD-10-CM | POA: Diagnosis not present

## 2015-12-26 ENCOUNTER — Other Ambulatory Visit: Payer: Self-pay | Admitting: Orthopedic Surgery

## 2015-12-31 ENCOUNTER — Encounter (HOSPITAL_BASED_OUTPATIENT_CLINIC_OR_DEPARTMENT_OTHER): Payer: Self-pay | Admitting: *Deleted

## 2016-01-08 ENCOUNTER — Encounter (HOSPITAL_BASED_OUTPATIENT_CLINIC_OR_DEPARTMENT_OTHER): Payer: Self-pay | Admitting: Orthopedic Surgery

## 2016-01-08 ENCOUNTER — Ambulatory Visit (HOSPITAL_BASED_OUTPATIENT_CLINIC_OR_DEPARTMENT_OTHER): Payer: BLUE CROSS/BLUE SHIELD | Admitting: Certified Registered"

## 2016-01-08 ENCOUNTER — Encounter (HOSPITAL_BASED_OUTPATIENT_CLINIC_OR_DEPARTMENT_OTHER)
Admission: RE | Disposition: A | Discharge status: Home / Self Care | Payer: Self-pay | Source: Ambulatory Visit | Attending: Orthopedic Surgery

## 2016-01-08 ENCOUNTER — Ambulatory Visit (HOSPITAL_BASED_OUTPATIENT_CLINIC_OR_DEPARTMENT_OTHER)
Admission: RE | Admit: 2016-01-08 | Discharge: 2016-01-08 | Disposition: A | Discharge status: Home / Self Care | Payer: BLUE CROSS/BLUE SHIELD | Source: Ambulatory Visit | Attending: Orthopedic Surgery | Admitting: Orthopedic Surgery

## 2016-01-08 DIAGNOSIS — Z79899 Other long term (current) drug therapy: Secondary | ICD-10-CM | POA: Insufficient documentation

## 2016-01-08 DIAGNOSIS — G43909 Migraine, unspecified, not intractable, without status migrainosus: Secondary | ICD-10-CM | POA: Diagnosis not present

## 2016-01-08 DIAGNOSIS — M67432 Ganglion, left wrist: Secondary | ICD-10-CM | POA: Diagnosis not present

## 2016-01-08 HISTORY — DX: Irritable bowel syndrome, unspecified: K58.9

## 2016-01-08 HISTORY — PX: GANGLION CYST EXCISION: SHX1691

## 2016-01-08 SURGERY — EXCISION, GANGLION CYST, WRIST
Anesthesia: General | Site: Wrist | Laterality: Left

## 2016-01-08 MED ORDER — BUPIVACAINE HCL (PF) 0.25 % IJ SOLN
INTRAMUSCULAR | Status: DC | PRN
  Administered 2016-01-08: 5 mL

## 2016-01-08 MED ORDER — DEXAMETHASONE SODIUM PHOSPHATE 10 MG/ML IJ SOLN
INTRAMUSCULAR | Status: DC | PRN
  Administered 2016-01-08: 10 mg via INTRAVENOUS

## 2016-01-08 MED ORDER — OXYCODONE HCL 5 MG PO TABS
5.0000 mg | ORAL_TABLET | Freq: Once | ORAL | Status: AC
  Administered 2016-01-08: 5 mg via ORAL

## 2016-01-08 MED ORDER — PROMETHAZINE HCL 25 MG/ML IJ SOLN: 6.2500 mg | INTRAMUSCULAR | Status: DC | PRN

## 2016-01-08 MED ORDER — BUPIVACAINE HCL (PF) 0.25 % IJ SOLN
INTRAMUSCULAR | Status: AC
  Filled 2016-01-08: qty 90

## 2016-01-08 MED ORDER — SCOPOLAMINE 1 MG/3DAYS TD PT72: 1.0000 | MEDICATED_PATCH | Freq: Once | TRANSDERMAL | Status: DC | PRN

## 2016-01-08 MED ORDER — GLYCOPYRROLATE 0.2 MG/ML IJ SOLN: 0.2000 mg | Freq: Once | INTRAMUSCULAR | Status: DC | PRN

## 2016-01-08 MED ORDER — HYDROCODONE-ACETAMINOPHEN 5-325 MG PO TABS: 1.0000 | ORAL_TABLET | Freq: Four times a day (QID) | ORAL | Status: DC | PRN

## 2016-01-08 MED ORDER — LIDOCAINE 2% (20 MG/ML) 5 ML SYRINGE
INTRAMUSCULAR | Status: DC | PRN
  Administered 2016-01-08: 60 mg via INTRAVENOUS

## 2016-01-08 MED ORDER — FENTANYL CITRATE (PF) 100 MCG/2ML IJ SOLN
INTRAMUSCULAR | Status: AC
  Filled 2016-01-08: qty 2

## 2016-01-08 MED ORDER — FENTANYL CITRATE (PF) 100 MCG/2ML IJ SOLN
50.0000 ug | INTRAMUSCULAR | Status: DC | PRN
  Administered 2016-01-08 (×2): 50 ug via INTRAVENOUS

## 2016-01-08 MED ORDER — ONDANSETRON HCL 4 MG/2ML IJ SOLN
INTRAMUSCULAR | Status: DC | PRN
  Administered 2016-01-08: 4 mg via INTRAVENOUS

## 2016-01-08 MED ORDER — PROPOFOL 10 MG/ML IV BOLUS
INTRAVENOUS | Status: DC | PRN
  Administered 2016-01-08: 200 mg via INTRAVENOUS

## 2016-01-08 MED ORDER — CEFAZOLIN SODIUM-DEXTROSE 2-4 GM/100ML-% IV SOLN
INTRAVENOUS | Status: AC
  Filled 2016-01-08: qty 100

## 2016-01-08 MED ORDER — FENTANYL CITRATE (PF) 100 MCG/2ML IJ SOLN
25.0000 ug | INTRAMUSCULAR | Status: DC | PRN
  Administered 2016-01-08 (×3): 25 ug via INTRAVENOUS

## 2016-01-08 MED ORDER — MIDAZOLAM HCL 2 MG/2ML IJ SOLN
1.0000 mg | INTRAMUSCULAR | Status: DC | PRN
  Administered 2016-01-08: 2 mg via INTRAVENOUS

## 2016-01-08 MED ORDER — KETOROLAC TROMETHAMINE 30 MG/ML IJ SOLN
30.0000 mg | Freq: Once | INTRAMUSCULAR | Status: DC | PRN
Start: 2016-01-08 — End: 2016-01-08

## 2016-01-08 MED ORDER — CHLORHEXIDINE GLUCONATE 4 % EX LIQD: 60.0000 mL | Freq: Once | CUTANEOUS | Status: DC

## 2016-01-08 MED ORDER — CEFAZOLIN SODIUM-DEXTROSE 2-4 GM/100ML-% IV SOLN
2.0000 g | INTRAVENOUS | Status: AC
Start: 2016-01-08 — End: 2016-01-08
  Administered 2016-01-08: 2 g via INTRAVENOUS

## 2016-01-08 MED ORDER — MIDAZOLAM HCL 2 MG/2ML IJ SOLN
INTRAMUSCULAR | Status: AC
  Filled 2016-01-08: qty 2

## 2016-01-08 MED ORDER — LACTATED RINGERS IV SOLN
INTRAVENOUS | Status: DC
  Administered 2016-01-08: 09:00:00 via INTRAVENOUS

## 2016-01-08 MED ORDER — OXYCODONE HCL 5 MG PO TABS
ORAL_TABLET | ORAL | Status: AC
  Filled 2016-01-08: qty 1

## 2016-01-08 SURGICAL SUPPLY — 49 items
APL SKNCLS STERI-STRIP NONHPOA (GAUZE/BANDAGES/DRESSINGS) ×1
BENZOIN TINCTURE PRP APPL 2/3 (GAUZE/BANDAGES/DRESSINGS) ×1 IMPLANT
BLADE MINI RND TIP GREEN BEAV (BLADE) IMPLANT
BLADE SURG 15 STRL LF DISP TIS (BLADE) ×1 IMPLANT
BLADE SURG 15 STRL SS (BLADE) ×2
BNDG CMPR 9X4 STRL LF SNTH (GAUZE/BANDAGES/DRESSINGS) ×1
BNDG COHESIVE 3X5 TAN STRL LF (GAUZE/BANDAGES/DRESSINGS) ×2 IMPLANT
BNDG ESMARK 4X9 LF (GAUZE/BANDAGES/DRESSINGS) ×1 IMPLANT
BNDG GAUZE ELAST 4 BULKY (GAUZE/BANDAGES/DRESSINGS) ×2 IMPLANT
CHLORAPREP W/TINT 26ML (MISCELLANEOUS) ×2 IMPLANT
CORDS BIPOLAR (ELECTRODE) ×2 IMPLANT
COVER BACK TABLE 60X90IN (DRAPES) ×2 IMPLANT
COVER MAYO STAND STRL (DRAPES) ×2 IMPLANT
CUFF TOURNIQUET SINGLE 18IN (TOURNIQUET CUFF) ×1 IMPLANT
DECANTER SPIKE VIAL GLASS SM (MISCELLANEOUS) IMPLANT
DRAPE EXTREMITY T 121X128X90 (DRAPE) ×2 IMPLANT
DRAPE SURG 17X23 STRL (DRAPES) ×2 IMPLANT
GAUZE SPONGE 4X4 12PLY STRL (GAUZE/BANDAGES/DRESSINGS) ×2 IMPLANT
GAUZE XEROFORM 1X8 LF (GAUZE/BANDAGES/DRESSINGS) ×2 IMPLANT
GLOVE BIOGEL PI IND STRL 6.5 (GLOVE) IMPLANT
GLOVE BIOGEL PI IND STRL 7.0 (GLOVE) IMPLANT
GLOVE BIOGEL PI IND STRL 8.5 (GLOVE) ×1 IMPLANT
GLOVE BIOGEL PI INDICATOR 6.5 (GLOVE) ×1
GLOVE BIOGEL PI INDICATOR 7.0 (GLOVE) ×2
GLOVE BIOGEL PI INDICATOR 8.5 (GLOVE) ×1
GLOVE SURG ORTHO 8.0 STRL STRW (GLOVE) ×2 IMPLANT
GOWN STRL REUS W/ TWL LRG LVL3 (GOWN DISPOSABLE) ×1 IMPLANT
GOWN STRL REUS W/TWL LRG LVL3 (GOWN DISPOSABLE) ×2
GOWN STRL REUS W/TWL XL LVL3 (GOWN DISPOSABLE) ×2 IMPLANT
NDL PRECISIONGLIDE 27X1.5 (NEEDLE) ×1 IMPLANT
NEEDLE PRECISIONGLIDE 27X1.5 (NEEDLE) ×2 IMPLANT
NS IRRIG 1000ML POUR BTL (IV SOLUTION) ×2 IMPLANT
PACK BASIN DAY SURGERY FS (CUSTOM PROCEDURE TRAY) ×2 IMPLANT
PAD CAST 3X4 CTTN HI CHSV (CAST SUPPLIES) ×1 IMPLANT
PADDING CAST ABS 4INX4YD NS (CAST SUPPLIES)
PADDING CAST ABS COTTON 4X4 ST (CAST SUPPLIES) ×1 IMPLANT
PADDING CAST COTTON 3X4 STRL (CAST SUPPLIES) ×2
SPLINT PLASTER CAST XFAST 3X15 (CAST SUPPLIES) ×5 IMPLANT
SPLINT PLASTER XTRA FASTSET 3X (CAST SUPPLIES) ×5
STOCKINETTE 4X48 STRL (DRAPES) ×2 IMPLANT
STRIP CLOSURE SKIN 1/2X4 (GAUZE/BANDAGES/DRESSINGS) ×1 IMPLANT
SUT ETHILON 4 0 PS 2 18 (SUTURE) ×2 IMPLANT
SUT PROLENE 4 0 PS 2 18 (SUTURE) ×1 IMPLANT
SUT VIC AB 4-0 P2 18 (SUTURE) ×1 IMPLANT
SUT VICRYL 4-0 PS2 18IN ABS (SUTURE) IMPLANT
SYR BULB 3OZ (MISCELLANEOUS) ×2 IMPLANT
SYR CONTROL 10ML LL (SYRINGE) ×2 IMPLANT
TOWEL OR 17X24 6PK STRL BLUE (TOWEL DISPOSABLE) ×4 IMPLANT
UNDERPAD 30X30 (UNDERPADS AND DIAPERS) ×1 IMPLANT

## 2016-01-08 NOTE — Op Note (Signed)
NAME:  Robin West, Robin West NO.:  [id number]  MEDICAL RECORD NO.:  [id number]  LOCATION:                                 FACILITY:[facility]  PHYSICIAN:  Daryll Brod, M.D.            DATE OF BIRTH:  DATE OF PROCEDURE:  01/08/2016 DATE OF DISCHARGE:                              OPERATIVE REPORT   PREOPERATIVE DIAGNOSIS:  Dorsal wrist ganglion, left wrist.  POSTOPERATIVE DIAGNOSIS:  Dorsal wrist ganglion, left wrist.  OPERATION:  Excision of dorsal wrist ganglion, left wrist with debridement of radiocarpal joint.  Partial synovectomy.  SURGEON:  Daryll Brod, MD.  ANESTHESIA:  None.  PLACE OF SURGERY:  [facility].  HISTORY:  The patient is a 35 year old female with history of a mass, dorsal aspect, left wrist.  She has elected to undergo surgical excision of this.  Pre, peri and postoperative course have been discussed along with risks and complications.  She is aware that there was no guarantee with the surgery; the possibility of infection; recurrence of injury to arteries, nerves, tendons; incomplete relief of symptoms and dystrophy. She is aware of other treatment alternatives, but has elected to undergo surgical excision.  In the preoperative area, the patient is seen, the extremity marked by both patient and surgeon and antibiotic given.  PROCEDURE IN DETAIL:  The patient was brought to the operating room, where a general anesthetic was carried out without difficulty.  She was prepped using ChloraPrep, supine position, left arm free.  A 3-minute dry time was allowed.  Time-out taken, confirming the patient and procedure.  The limb was exsanguinated with an Esmarch bandage. Tourniquet placed high on the upper left arm was inflated to 250 mmHg. A transverse incision was made directly over the mass, dorsal aspect, left wrist, carried down through the subcutaneous tissue.  Bleeders were electrocauterized with bipolar.  The extensor retinaculum was  split, cyst was immediately encountered.  This was a large approximately 3.5 cm in diameter cyst with blunt and sharp dissection, this was followed down to the radiocarpal joint.  The area was opened.  A large find of tissue was then debrided from inside the joint at the scapholunate ligament complex area.  No further lesions were identified.  The specimen was sent to Pathology.  The wound was copiously irrigated with saline.  The capsule was then closed with figure-of-eight 4-0 Vicryl sutures.  A subcutaneous tissue was then closed with interrupted 4-0 Vicryl, the skin with a subcuticular 4-0 Prolene sutures.  Steri-Strips and benzoin were applied.  The sterile compressive dressing and splint were applied after local infiltration with 0.25% bupivacaine without epinephrine, approximately 8 mL was used.  On deflation of the tourniquet, all fingers were immediately pinked.  She was taken to the recovery room for observation in satisfactory condition.  She will be discharged to home to return to the [place] in 1 week, on Nor[place].          ______________________________ Daryll Brod, M.D.     GK/MEDQ  D:  01/08/2016  T:  01/08/2016  Job:  [id number]

## 2016-01-08 NOTE — H&P (Signed)
Robin West is an 35 y.o. female.   Chief Complaint: mass left wrist HPI: [name] is a 35 year old right hand dominant female referred by Dr. Garnette Gunner for consultation regarding a mass on her dorsal aspect left wrist, this has been present for the past 8 to 12 months. She recalls no history of injury. She states that it will frequently cause pain, especially with typing, it increases and decreases in size. This is an aching pain with VAS score of 6/10. She states that it has gotten bigger and smaller when discomfort goes away, when it enlarges it increase her pain. She has no history of diabetes, thyroid problems, arthritis, or gout. She has not had any treatment for this. She has no history of injury to her neck.  Past Medical History:  Diagnosis Date  . IBS (irritable bowel syndrome)  . Migraines   Past Surgical History:  Procedure Laterality Date  . hemroidectomy  . tonsils surgery    Past Medical History  Diagnosis Date  . Chicken pox   . Frequent headaches   . Allergy   . IBS (irritable bowel syndrome)     Past Surgical History  Procedure Laterality Date  . Tonsillectomy  2010  . Hemorroidectomy  2004  . Wisdom tooth extraction      Family History  Problem Relation Age of Onset  . Arthritis Father   . Hyperlipidemia Father   . Hypertension Father   . Arthritis Maternal Grandmother   . Stroke Maternal Grandfather   . Cancer Paternal Grandmother     Ovarian   . Cancer Paternal Aunt     ovarian cancer   Social History:  reports that she has never smoked. She has never used smokeless tobacco. She reports that she drinks alcohol. She reports that she does not use illicit drugs.  Allergies: No Known Allergies  No prescriptions prior to admission    No results found for this or any previous visit (from the past 48 hour(s)).  No results found.   Pertinent items are noted in HPI.  Height 5\' 6"  (1.676 m), weight 78.019 kg (172 lb), last menstrual period  11/30/2015.  General appearance: alert, cooperative and appears stated age Head: Normocephalic, without obvious abnormality Neck: no JVD Resp: clear to auscultation bilaterally Cardio: regular rate and rhythm, S1, S2 normal, no murmur, click, rub or gallop GI: soft, non-tender; bowel sounds normal; no masses,  no organomegaly Extremities: mass dorsal left hand Pulses: 2+ and symmetric Skin: Skin color, texture, turgor normal. No rashes or lesions Neurologic: Grossly normal Incision/Wound: na  Assessment/Plan Assessment:  1. Ganglion cyst of wrist, left    Plan: PLAN: We have discussed with her various treatment alternatives , along with the etiology of this. She is advised that we can try anti-inflammatories, splinting, which will give her some relief, but not get rid of the cyst. We have discussed aspiration or lysis of the cyst. We have discussed surgical excision. Pre, peri, and postoperative course have been discussed along with risks and complications. She is aware there are no guarantee with the surgery, possibility of infection, recurrence, injury to arteries, nerves, tendons, incomplete relief of symptoms, dystrophy, 10% recurrence rate on the dorsal aspect of cyst and 20% on the volar aspect recurrence. She would like to proceed to have this excised. She states she is tired of it and she is scheduled for excision dorsal wrist ganglion, left wrist as an outpatient under regional general anesthesia after anesthesia's discretion.   Devaney Segers R 01/08/2016,  7:31 AM

## 2016-01-08 NOTE — Anesthesia Procedure Notes (Signed)
Procedure Name: LMA Insertion Date/Time: 01/08/2016 11:06 AM Performed by: Baxter Flattery Pre-anesthesia Checklist: Patient identified, Emergency Drugs available, Suction available and Patient being monitored Patient Re-evaluated:Patient Re-evaluated prior to inductionOxygen Delivery Method: Circle system utilized Preoxygenation: Pre-oxygenation with 100% oxygen Intubation Type: IV induction Ventilation: Mask ventilation without difficulty LMA: LMA inserted LMA Size: 4.0 Number of attempts: 1 Airway Equipment and Method: Bite block Placement Confirmation: positive ETCO2 and breath sounds checked- equal and bilateral Tube secured with: Tape Dental Injury: Teeth and Oropharynx as per pre-operative assessment

## 2016-01-08 NOTE — Discharge Instructions (Addendum)

## 2016-01-08 NOTE — Brief Op Note (Signed)
01/08/2016  11:40 AM  PATIENT:  Robin West  35 y.o. female  PRE-OPERATIVE DIAGNOSIS:  DORSAL CYST LEFT WRIST  POST-OPERATIVE DIAGNOSIS:  DORSAL CYST LEFT WRIST  PROCEDURE:  Procedure(s): EXCISION DORSAL GANGLION LEFT WRIST (Left)  SURGEON:  Surgeon(s) and Role:    * Daryll Brod, MD - Primary  PHYSICIAN ASSISTANT:   ASSISTANTS: none   ANESTHESIA:   local and general  EBL:  Total I/O In: 400 [I.V.:400] Out: -   BLOOD ADMINISTERED:none  DRAINS: none   LOCAL MEDICATIONS USED:  BUPIVICAINE   SPECIMEN:  Excision  DISPOSITION OF SPECIMEN:  PATHOLOGY  COUNTS:  YES  TOURNIQUET:   Total Tourniquet Time Documented: Upper Arm (Left) - 23 minutes Total: Upper Arm (Left) - 23 minutes   DICTATION: .Other Dictation: Dictation Number [id number]  PLAN OF CARE: Discharge to home after PACU  PATIENT DISPOSITION:  PACU - hemodynamically stable.

## 2016-01-08 NOTE — Op Note (Signed)
Dictation Number [id number]

## 2016-01-08 NOTE — Transfer of Care (Signed)
Immediate Anesthesia Transfer of Care Note  Patient: Robin West  Procedure(s) Performed: Procedure(s): EXCISION DORSAL GANGLION LEFT WRIST (Left)  Patient Location: PACU  Anesthesia Type:General  Level of Consciousness: awake, alert  and oriented  Airway & Oxygen Therapy: Patient Spontanous Breathing and Patient connected to face mask oxygen  Post-op Assessment: Report given to RN and Post -op Vital signs reviewed and stable  Post vital signs: Reviewed and stable  Last Vitals:  Filed Vitals:   01/08/16 0910 01/08/16 1145  BP: 106/78   Pulse: 74   Temp: 36.9 C 36.8 C  Resp: 18     Last Pain: There were no vitals filed for this visit.    Patients Stated Pain Goal: 0 ([phone] [id number])  Complications: No apparent anesthesia complications

## 2016-01-08 NOTE — Anesthesia Postprocedure Evaluation (Signed)
Anesthesia Post Note  Patient: Robin West  Procedure(s) Performed: Procedure(s) (LRB): EXCISION DORSAL GANGLION LEFT WRIST (Left)  Patient location during evaluation: PACU Anesthesia Type: General Level of consciousness: awake and alert Pain management: pain level controlled Vital Signs Assessment: post-procedure vital signs reviewed and stable Respiratory status: spontaneous breathing, nonlabored ventilation, respiratory function stable and patient connected to nasal cannula oxygen Cardiovascular status: blood pressure returned to baseline and stable Postop Assessment: no signs of nausea or vomiting Anesthetic complications: no    Last Vitals:  Filed Vitals:   01/08/16 0910 01/08/16 1145  BP: 106/78   Pulse: 74   Temp: 36.9 C 36.8 C  Resp: 18     Last Pain:  Filed Vitals:   01/08/16 1200  PainSc: 0-No pain                 Shaketta Rill S

## 2016-01-08 NOTE — Anesthesia Preprocedure Evaluation (Signed)
Anesthesia Evaluation  Patient identified by MRN, date of birth, ID band Patient awake    Reviewed: Allergy & Precautions, NPO status , Patient's Chart, lab work & pertinent test results  Airway Mallampati: II  TM Distance: >3 FB Neck ROM: Full    Dental no notable dental hx.    Pulmonary neg pulmonary ROS,    Pulmonary exam normal breath sounds clear to auscultation       Cardiovascular negative cardio ROS Normal cardiovascular exam Rhythm:Regular Rate:Normal     Neuro/Psych negative neurological ROS  negative psych ROS   GI/Hepatic negative GI ROS, Neg liver ROS,   Endo/Other  negative endocrine ROS  Renal/GU negative Renal ROS  negative genitourinary   Musculoskeletal negative musculoskeletal ROS (+)   Abdominal   Peds negative pediatric ROS (+)  Hematology negative hematology ROS (+)   Anesthesia Other Findings   Reproductive/Obstetrics negative OB ROS                             Anesthesia Physical Anesthesia Plan  ASA: I  Anesthesia Plan: General   Post-op Pain Management:    Induction: Intravenous  Airway Management Planned: LMA  Additional Equipment:   Intra-op Plan:   Post-operative Plan: Extubation in OR  Informed Consent: I have reviewed the patients History and Physical, chart, labs and discussed the procedure including the risks, benefits and alternatives for the proposed anesthesia with the patient or authorized representative who has indicated his/her understanding and acceptance.   Dental advisory given  Plan Discussed with: CRNA and Surgeon  Anesthesia Plan Comments:         Anesthesia Quick Evaluation  

## 2016-01-15 ENCOUNTER — Encounter (HOSPITAL_BASED_OUTPATIENT_CLINIC_OR_DEPARTMENT_OTHER): Payer: Self-pay | Admitting: Orthopedic Surgery

## 2016-02-06 ENCOUNTER — Telehealth: Payer: Self-pay

## 2016-02-06 ENCOUNTER — Encounter: Payer: Self-pay | Admitting: Internal Medicine

## 2016-02-06 ENCOUNTER — Ambulatory Visit (INDEPENDENT_AMBULATORY_CARE_PROVIDER_SITE_OTHER): Payer: BLUE CROSS/BLUE SHIELD | Admitting: Internal Medicine

## 2016-02-06 VITALS — BP 112/78 | HR 79 | Temp 98.2°F | Wt 172.0 lb

## 2016-02-06 DIAGNOSIS — K589 Irritable bowel syndrome without diarrhea: Secondary | ICD-10-CM

## 2016-02-06 DIAGNOSIS — R110 Nausea: Secondary | ICD-10-CM

## 2016-02-06 DIAGNOSIS — R198 Other specified symptoms and signs involving the digestive system and abdomen: Secondary | ICD-10-CM

## 2016-02-06 DIAGNOSIS — R1031 Right lower quadrant pain: Secondary | ICD-10-CM | POA: Diagnosis not present

## 2016-02-06 MED ORDER — HYDROCORTISONE ACETATE 25 MG RE SUPP: 25.0000 mg | Freq: Two times a day (BID) | RECTAL | 0 refills | Status: DC

## 2016-02-06 MED ORDER — ONDANSETRON HCL 4 MG PO TABS: 4.0000 mg | ORAL_TABLET | Freq: Three times a day (TID) | ORAL | 0 refills | Status: DC | PRN

## 2016-02-06 NOTE — Progress Notes (Signed)
Subjective:    Patient ID: Robin West, female    DOB: 1980/10/26, 35 y.o.   MRN: [id number]  HPI  Pt presents to the clinic today with c/o abdominal pain, nausea, and rectal bleeding. This started 3 days ago. The pain is in her RLQ. She describes the pain as achy and sore. She is nauseated but denies vomiting. She has alternating constipation and diarrhea with her history of IBS. She has noticed a small amount of blood in her stool but none today. She has had hemorrhoids in the past, and even had them ligated but they came back. She has not tried anything OTC. She had a sigmoidoscopy in 2000.  Review of Systems      Past Medical History:  Diagnosis Date  . Allergy   . Chicken pox   . Frequent headaches   . IBS (irritable bowel syndrome)     Current Outpatient Prescriptions  Medication Sig Dispense Refill  . LO LOESTRIN FE 1 MG-10 MCG / 10 MCG tablet Take 1 tablet by mouth daily.  12  . Nutritional Supplements (JUICE PLUS FIBRE PO) Take 6 capsules by mouth daily.    Marland Kitchen PAZEO 0.7 % SOLN Place 1 drop into both eyes. For 14 days as needed for allergies  2  . promethazine (PHENERGAN) 25 MG tablet TAKE 1 TABLET (25 MG TOTAL) BY MOUTH EVERY 6 (SIX) HOURS AS NEEDED FOR NAUSEA.  3  . sertraline (ZOLOFT) 50 MG tablet Take 1 tablet (50 mg total) by mouth daily. 30 tablet 11  . SUMAtriptan (IMITREX) 100 MG tablet Take 1 tablet by mouth. For migraine may repeat in 2hrs if not better Max 2/24hr  2  . SUMAtriptan 6 MG/0.5ML SOAJ INJECT 0.5MLS SUBCUTANEOUSLY ONCE AS NEEDED FOR MIGRAINE. MAY REPEAT IN 1 HOUR IF NEEDED  3   No current facility-administered medications for this visit.     No Known Allergies  Family History  Problem Relation Age of Onset  . Arthritis Father   . Hyperlipidemia Father   . Hypertension Father   . Arthritis Maternal Grandmother   . Stroke Maternal Grandfather   . Cancer Paternal Grandmother     Ovarian   . Cancer Paternal Aunt     ovarian cancer     Social History   Social History  . Marital status: Married    Spouse name: N/A  . Number of children: N/A  . Years of education: N/A   Occupational History  . Not on file.   Social History Main Topics  . Smoking status: Never Smoker  . Smokeless tobacco: Never Used  . Alcohol use 0.0 oz/week     Comment: rare  . Drug use: No  . Sexual activity: Yes    Birth control/ protection: Pill   Other Topics Concern  . Not on file   Social History Narrative  . No narrative on file     Constitutional: Denies fever, malaise, fatigue, headache or abrupt weight changes.  Respiratory: Denies difficulty breathing, shortness of breath, cough or sputum production.   Cardiovascular: Denies chest pain, chest tightness, palpitations or swelling in the hands or feet.  Gastrointestinal: Pt reports abdominal pain, nausea, constipation, diarrhea, blood in stool.   No other specific complaints in a complete review of systems (except as listed in HPI above).  Objective:   Physical Exam    BP 112/78   Pulse 79   Temp 98.2 F (36.8 C) (Oral)   Wt 172 lb (78 kg)  LMP 11/30/2015 (Exact Date) Comment: denies chance of pregnancy. very irregular  SpO2 98%   BMI 27.76 kg/m  Wt Readings from Last 3 Encounters:  02/06/16 172 lb (78 kg)  01/08/16 170 lb (77.1 kg)  12/10/15 172 lb (78 kg)    General: Appears her stated age, well developed, well nourished in NAD. Cardiovascular: Normal rate and rhythm. S1,S2 noted.  No murmur, rubs or gallops noted.  Pulmonary/Chest: Normal effort and positive vesicular breath sounds. No respiratory distress. No wheezes, rales or ronchi noted.  Abdomen: Soft and tender in the RLQ. Normal bowel sounds. No distention or masses noted.  Rectal: External hemorrhoids noted. Internal hemorrhoid noted at 6 oclock, inflamed but not actively bleeding.   BMET    Component Value Date/Time   NA 137 12/10/2015 1508   K 3.5 12/10/2015 1508   CL 103 12/10/2015 1508    CO2 27 12/10/2015 1508   GLUCOSE 112 (H) 12/10/2015 1508   BUN 22 12/10/2015 1508   CREATININE 0.58 12/10/2015 1508   CALCIUM 9.3 12/10/2015 1508    Lipid Panel     Component Value Date/Time   CHOL 210 (H) 12/10/2015 1508   TRIG 119.0 12/10/2015 1508   HDL 47.80 12/10/2015 1508   CHOLHDL 4 12/10/2015 1508   VLDL 23.8 12/10/2015 1508   LDLCALC 138 (H) 12/10/2015 1508    CBC    Component Value Date/Time   WBC 8.3 12/10/2015 1508   RBC 3.88 12/10/2015 1508   HGB 11.6 (L) 12/10/2015 1508   HCT 34.6 (L) 12/10/2015 1508   PLT 347.0 12/10/2015 1508   MCV 89.1 12/10/2015 1508   MCHC 33.7 12/10/2015 1508   RDW 12.8 12/10/2015 1508    Hgb A1C No results found for: HGBA1C        Assessment & Plan:   IBS flare with internal hemorrhoid:  Start a probiotic daily Continue Zoloft for symptom management eRx for Anusol suppositories eRx for Zofran for nausea If persist, can refer back to GI for further evaluation  RTC as needed or if symptoms persist or worsen. Webb Silversmith, NP

## 2016-02-06 NOTE — Patient Instructions (Signed)

## 2016-02-06 NOTE — Progress Notes (Signed)
Pre visit review using our clinic review tool, if applicable. No additional management support is needed unless otherwise documented below in the visit note. 

## 2016-02-06 NOTE — Telephone Encounter (Signed)
Pt left v/m; pt was seen earlier today; pts ins will not cover the hydrocortisone suppository. [facility] advised pt to call [facility] for substitute med. Pt would like to pick up med today.Please advise.

## 2016-02-08 ENCOUNTER — Other Ambulatory Visit: Payer: Self-pay | Admitting: Internal Medicine

## 2016-02-08 DIAGNOSIS — K644 Residual hemorrhoidal skin tags: Secondary | ICD-10-CM

## 2016-02-08 MED ORDER — HYDROCORTISONE 2.5 % RE CREA: TOPICAL_CREAM | Freq: Two times a day (BID) | RECTAL | Status: DC

## 2016-02-08 NOTE — Telephone Encounter (Signed)
Sent in hydrocortisone rectal cream in its place

## 2016-02-09 NOTE — Telephone Encounter (Signed)
Left detailed msg on VM per HIPAA asking if pt was ok with the OTC treatment that the pharmacist at [facility] suggested---also to see if she needed [name] to send in alternative medication

## 2016-02-27 ENCOUNTER — Ambulatory Visit (INDEPENDENT_AMBULATORY_CARE_PROVIDER_SITE_OTHER): Payer: BLUE CROSS/BLUE SHIELD | Admitting: Internal Medicine

## 2016-02-27 ENCOUNTER — Encounter: Payer: Self-pay | Admitting: Internal Medicine

## 2016-02-27 VITALS — BP 116/76 | HR 98 | Temp 98.7°F | Wt 169.0 lb

## 2016-02-27 DIAGNOSIS — A084 Viral intestinal infection, unspecified: Secondary | ICD-10-CM | POA: Diagnosis not present

## 2016-02-27 NOTE — Progress Notes (Signed)
Subjective:    Patient ID: Robin West, female    DOB: 09-Jun-1981, 35 y.o.   MRN: [id number]  HPI  Pt presents to the clinic today with c/o nausea and diarrhea. She reports this started yesterday. She denies vomiting or blood in her stool. The diarrhea is associated with fecal urgency and some incontinence. She has no appetite but denies fever or chills. She has IBS but she reports this feels different. Her son recently had a GI virus and she is not sure if maybe she got this from him. She has not tried anything OTC. She has not been on antibiotics in the last 8 weeks. She denies changes in diet or medications.   Review of Systems      Past Medical History:  Diagnosis Date  . Allergy   . Chicken pox   . Frequent headaches   . IBS (irritable bowel syndrome)     Current Outpatient Prescriptions  Medication Sig Dispense Refill  . hydrocortisone (ANUSOL-HC) 25 MG suppository Place 1 suppository (25 mg total) rectally 2 (two) times daily. 12 suppository 0  . LO LOESTRIN FE 1 MG-10 MCG / 10 MCG tablet Take 1 tablet by mouth daily.  12  . Nutritional Supplements (JUICE PLUS FIBRE PO) Take 6 capsules by mouth daily.    . ondansetron (ZOFRAN) 4 MG tablet Take 1 tablet (4 mg total) by mouth every 8 (eight) hours as needed. 20 tablet 0  . PAZEO 0.7 % SOLN Place 1 drop into both eyes. For 14 days as needed for allergies  2  . promethazine (PHENERGAN) 25 MG tablet TAKE 1 TABLET (25 MG TOTAL) BY MOUTH EVERY 6 (SIX) HOURS AS NEEDED FOR NAUSEA.  3  . sertraline (ZOLOFT) 50 MG tablet Take 1 tablet (50 mg total) by mouth daily. 30 tablet 11  . SUMAtriptan (IMITREX) 100 MG tablet Take 1 tablet by mouth. For migraine may repeat in 2hrs if not better Max 2/24hr  2  . SUMAtriptan 6 MG/0.5ML SOAJ INJECT 0.5MLS SUBCUTANEOUSLY ONCE AS NEEDED FOR MIGRAINE. MAY REPEAT IN 1 HOUR IF NEEDED  3   Current Facility-Administered Medications  Medication Dose Route Frequency Provider Last Rate Last Dose  .  hydrocortisone (ANUSOL-HC) 2.5 % rectal cream   Rectal BID Jearld Fenton, NP        No Known Allergies  Family History  Problem Relation Age of Onset  . Arthritis Father   . Hyperlipidemia Father   . Hypertension Father   . Arthritis Maternal Grandmother   . Stroke Maternal Grandfather   . Cancer Paternal Grandmother     Ovarian   . Cancer Paternal Aunt     ovarian cancer    Social History   Social History  . Marital status: Married    Spouse name: N/A  . Number of children: N/A  . Years of education: N/A   Occupational History  . Not on file.   Social History Main Topics  . Smoking status: Never Smoker  . Smokeless tobacco: Never Used  . Alcohol use 0.0 oz/week     Comment: rare  . Drug use: No  . Sexual activity: Yes    Birth control/ protection: Pill   Other Topics Concern  . Not on file   Social History Narrative  . No narrative on file     Constitutional: Denies fever, malaise, fatigue, headache or abrupt weight changes.  Gastrointestinal: Pt reports nausea and diarrhea. Denies abdominal pain, bloating, constipation, or blood in the  stool.   No other specific complaints in a complete review of systems (except as listed in HPI above).  Objective:   Physical Exam  BP 116/76   Pulse 98   Temp 98.7 F (37.1 C) (Oral)   Wt 169 lb (76.7 kg)   SpO2 98%   BMI 27.28 kg/m  Wt Readings from Last 3 Encounters:  02/27/16 169 lb (76.7 kg)  02/06/16 172 lb (78 kg)  01/08/16 170 lb (77.1 kg)    General: Appears her stated age, in NAD.  Abdomen: Soft and generally tender. Hyperactive bowel sounds. No distention or masses noted.   BMET    Component Value Date/Time   NA 137 12/10/2015 1508   K 3.5 12/10/2015 1508   CL 103 12/10/2015 1508   CO2 27 12/10/2015 1508   GLUCOSE 112 (H) 12/10/2015 1508   BUN 22 12/10/2015 1508   CREATININE 0.58 12/10/2015 1508   CALCIUM 9.3 12/10/2015 1508    Lipid Panel     Component Value Date/Time   CHOL 210 (H)  12/10/2015 1508   TRIG 119.0 12/10/2015 1508   HDL 47.80 12/10/2015 1508   CHOLHDL 4 12/10/2015 1508   VLDL 23.8 12/10/2015 1508   LDLCALC 138 (H) 12/10/2015 1508    CBC    Component Value Date/Time   WBC 8.3 12/10/2015 1508   RBC 3.88 12/10/2015 1508   HGB 11.6 (L) 12/10/2015 1508   HCT 34.6 (L) 12/10/2015 1508   PLT 347.0 12/10/2015 1508   MCV 89.1 12/10/2015 1508   MCHC 33.7 12/10/2015 1508   RDW 12.8 12/10/2015 1508    Hgb A1C No results found for: HGBA1C         Assessment & Plan:   Nausea and diarrhea:  Likely viral Discussed the importance of handwashing to prevent spread of virus Clean the toilet bowl, handle, sink knobs and door knobs with Lysol She has a RX for Phenergan to take as needed [employer] and clear liquids until symptoms subside No antidiarrheals at this time  RTC as needed or if symptoms persist or worsen Aloha Bartok, NP

## 2016-02-27 NOTE — Patient Instructions (Signed)

## 2016-02-29 ENCOUNTER — Encounter: Payer: Self-pay | Admitting: Emergency Medicine

## 2016-02-29 ENCOUNTER — Emergency Department
Admission: EM | Admit: 2016-02-29 | Discharge: 2016-03-01 | Disposition: A | Discharge status: Home / Self Care | Payer: BLUE CROSS/BLUE SHIELD | Attending: Emergency Medicine | Admitting: Emergency Medicine

## 2016-02-29 DIAGNOSIS — Z79899 Other long term (current) drug therapy: Secondary | ICD-10-CM | POA: Diagnosis not present

## 2016-02-29 DIAGNOSIS — R1084 Generalized abdominal pain: Secondary | ICD-10-CM | POA: Diagnosis not present

## 2016-02-29 DIAGNOSIS — K529 Noninfective gastroenteritis and colitis, unspecified: Secondary | ICD-10-CM | POA: Insufficient documentation

## 2016-02-29 DIAGNOSIS — R319 Hematuria, unspecified: Secondary | ICD-10-CM | POA: Diagnosis not present

## 2016-02-29 LAB — COMPREHENSIVE METABOLIC PANEL
ALK PHOS: 51 U/L (ref 38–126)
ALT: 28 U/L (ref 14–54)
ANION GAP: 6 (ref 5–15)
AST: 24 U/L (ref 15–41)
Albumin: 4 g/dL (ref 3.5–5.0)
BILIRUBIN TOTAL: 0.3 mg/dL (ref 0.3–1.2)
BUN: 20 mg/dL (ref 6–20)
CALCIUM: 9 mg/dL (ref 8.9–10.3)
CO2: 25 mmol/L (ref 22–32)
Chloride: 106 mmol/L (ref 101–111)
Creatinine, Ser: 0.48 mg/dL (ref 0.44–1.00)
GFR calc Af Amer: >60 mL/min (ref >60)
GLUCOSE: 102 mg/dL — AB (ref 65–99)
POTASSIUM: 3.4 mmol/L — AB (ref 3.5–5.1)
Sodium: 137 mmol/L (ref 135–145)
TOTAL PROTEIN: 7.5 g/dL (ref 6.5–8.1)

## 2016-02-29 LAB — URINALYSIS COMPLETE WITH MICROSCOPIC (ARMC ONLY)
BILIRUBIN URINE: NEGATIVE
GLUCOSE, UA: NEGATIVE mg/dL
NITRITE: NEGATIVE
PH: 5 (ref 5.0–8.0)
Protein, ur: 30 mg/dL — AB
SPECIFIC GRAVITY, URINE: 1.03 (ref 1.005–1.030)

## 2016-02-29 LAB — CBC
HEMATOCRIT: 36.4 % (ref 35.0–47.0)
HEMOGLOBIN: 12.7 g/dL (ref 12.0–16.0)
MCH: 31 pg (ref 26.0–34.0)
MCHC: 34.9 g/dL (ref 32.0–36.0)
MCV: 88.8 fL (ref 80.0–100.0)
Platelets: 338 10*3/uL (ref 150–440)
RBC: 4.1 MIL/uL (ref 3.80–5.20)
RDW: 12.5 % (ref 11.5–14.5)
WBC: 7.7 10*3/uL (ref 3.6–11.0)

## 2016-02-29 LAB — LIPASE, BLOOD: LIPASE: 26 U/L (ref 11–51)

## 2016-02-29 LAB — POCT PREGNANCY, URINE: Preg Test, Ur: NEGATIVE

## 2016-02-29 MED ORDER — DICYCLOMINE HCL 10 MG PO CAPS
10.0000 mg | ORAL_CAPSULE | Freq: Once | ORAL | Status: AC
  Administered 2016-03-01: 10 mg via ORAL
  Filled 2016-02-29: qty 1

## 2016-02-29 NOTE — ED Triage Notes (Signed)
C/O abdominal pain.  2 weeks ago patient had a stomach virus and then seen by PCP on Friday and told she had a stomach virus.  Patient states had some diarrhea on thursay, stool remain soft, and nauseated.  Has been taking zofran and phenergan. Last zofran taken this morning.

## 2016-03-01 ENCOUNTER — Emergency Department: Payer: BLUE CROSS/BLUE SHIELD

## 2016-03-01 ENCOUNTER — Encounter: Payer: Self-pay | Admitting: Internal Medicine

## 2016-03-01 DIAGNOSIS — R319 Hematuria, unspecified: Secondary | ICD-10-CM | POA: Diagnosis not present

## 2016-03-01 MED ORDER — DICYCLOMINE HCL 20 MG PO TABS: 20.0000 mg | ORAL_TABLET | Freq: Three times a day (TID) | ORAL | 0 refills | Status: DC | PRN

## 2016-03-01 MED ORDER — METRONIDAZOLE 500 MG PO TABS: 500.0000 mg | ORAL_TABLET | Freq: Three times a day (TID) | ORAL | 0 refills | Status: DC

## 2016-03-01 MED ORDER — CIPROFLOXACIN HCL 500 MG PO TABS: 500.0000 mg | ORAL_TABLET | Freq: Two times a day (BID) | ORAL | 0 refills | Status: DC

## 2016-03-01 MED ORDER — ONDANSETRON 4 MG PO TBDP: 4.0000 mg | ORAL_TABLET | Freq: Three times a day (TID) | ORAL | 0 refills | Status: DC | PRN

## 2016-03-01 NOTE — ED Notes (Signed)
Dr Joni Fears to bedside for discharge planning

## 2016-03-01 NOTE — ED Notes (Signed)
Pt reports being able to keep her ginger ale down without diff. Pt states that she is nauseated but feels like she is getting a migraine and that causes her nausea

## 2016-03-01 NOTE — ED Notes (Signed)
Pt states that she has been having abd issues for the past 2 weeks, states diarrhea off and on then this past Thursday experience diarrhea every 10 min all day. Pt states some vomiting also, pt also states that one of her children vomited once with diarrhea this past week, pt states that she has been seen twice and diagnosed with a stomach virus in the past 2 weeks. Pt states that her stool is becoming more formed now but has the appearance of sand. Pt also states that her abd is bloated and uncomfortable all over, states that a few nights ago she passed out in the living room floor. 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

## 2016-03-01 NOTE — ED Notes (Signed)
Pt take to ct

## 2016-03-01 NOTE — ED Provider Notes (Signed)
[facility] Emergency Department Provider Note  ____________________________________________  Time seen: Approximately 12:07 AM  I have reviewed the triage vital signs and the nursing notes.   HISTORY  Chief Complaint Abdominal Pain    HPI Robin West is a 35 y.o. female who complains of generalized abdominal pain with distention over the past 2 weeks, worse for the last few days. She seen her doctor both times but told her both times it was a viral illness. Patient had a few episodes of vomiting 3 days ago which resolved. No fevers chills or sweats. No chest pain shortness of breath. Pain is aching and nonradiating. No aggravating or alleviating factors. She is tolerating oral intake although decreased intake recently  Has a history of IBS.   Past Medical History:  Diagnosis Date  . Allergy   . Chicken pox   . Frequent headaches   . IBS (irritable bowel syndrome)      Patient Active Problem List   Diagnosis Date Noted  . Frequent headaches 10/22/2014  . IBS (irritable bowel syndrome) 10/22/2014  . Hemorrhoids, internal 10/22/2014  . Seasonal allergies 10/22/2014     Past Surgical History:  Procedure Laterality Date  . GANGLION CYST EXCISION Left 01/08/2016   Procedure: EXCISION DORSAL GANGLION LEFT WRIST;  Surgeon: Daryll Brod, MD;  Location: [place];  Service: Orthopedics;  Laterality: Left;  . HEMORROIDECTOMY  2004  . TONSILLECTOMY  2010  . WISDOM TOOTH EXTRACTION       Prior to Admission medications   Medication Sig Start Date End Date Taking? Authorizing Provider  ciprofloxacin (CIPRO) 500 MG tablet Take 1 tablet (500 mg total) by mouth 2 (two) times daily. 03/01/16   Carrie Mew, MD  dicyclomine (BENTYL) 20 MG tablet Take 1 tablet (20 mg total) by mouth 3 (three) times daily as needed for spasms. 03/01/16   Carrie Mew, MD  hydrocortisone (ANUSOL-HC) 25 MG suppository Place 1 suppository (25 mg total)  rectally 2 (two) times daily. 02/06/16   Jearld Fenton, NP  LO LOESTRIN FE 1 MG-10 MCG / 10 MCG tablet Take 1 tablet by mouth daily. 10/08/14   Historical Provider, MD  metroNIDAZOLE (FLAGYL) 500 MG tablet Take 1 tablet (500 mg total) by mouth 3 (three) times daily. 03/01/16   Carrie Mew, MD  Nutritional Supplements (JUICE PLUS FIBRE PO) Take 6 capsules by mouth daily.    Historical Provider, MD  ondansetron (ZOFRAN ODT) 4 MG disintegrating tablet Take 1 tablet (4 mg total) by mouth every 8 (eight) hours as needed for nausea or vomiting. 03/01/16   Carrie Mew, MD  ondansetron (ZOFRAN) 4 MG tablet Take 1 tablet (4 mg total) by mouth every 8 (eight) hours as needed. 02/06/16   Jearld Fenton, NP  PAZEO 0.7 % SOLN Place 1 drop into both eyes. For 14 days as needed for allergies 10/17/14   Historical Provider, MD  promethazine (PHENERGAN) 25 MG tablet TAKE 1 TABLET (25 MG TOTAL) BY MOUTH EVERY 6 (SIX) HOURS AS NEEDED FOR NAUSEA. 11/20/15   Historical Provider, MD  sertraline (ZOLOFT) 50 MG tablet Take 1 tablet (50 mg total) by mouth daily. 12/24/15   Jearld Fenton, NP  SUMAtriptan (IMITREX) 100 MG tablet Take 1 tablet by mouth. For migraine may repeat in 2hrs if not better Max 2/24hr 09/18/14   Historical Provider, MD  SUMAtriptan 6 MG/0.5ML SOAJ INJECT 0.5MLS SUBCUTANEOUSLY ONCE AS NEEDED FOR MIGRAINE. MAY REPEAT IN 1 HOUR IF NEEDED 11/21/15  Historical Provider, MD     Allergies Review of patient's allergies indicates no known allergies.   Family History  Problem Relation Age of Onset  . Arthritis Father   . Hyperlipidemia Father   . Hypertension Father   . Arthritis Maternal Grandmother   . Stroke Maternal Grandfather   . Cancer Paternal Grandmother     Ovarian   . Cancer Paternal Aunt     ovarian cancer    Social History Social History  Substance Use Topics  . Smoking status: Never Smoker  . Smokeless tobacco: Never Used  . Alcohol use 0.0 oz/week     Comment: rare    Review  of Systems  Constitutional:   No fever or chills.  ENT:   No sore throat. No rhinorrhea. Cardiovascular:   No chest pain. Respiratory:   No dyspnea or cough. Gastrointestinal:   Generalized abdominal pain with diarrhea.  Genitourinary:   Negative for dysuria or difficulty urinating. Musculoskeletal:   Negative for focal pain or swelling Neurological:   Negative for headaches 10-point ROS otherwise negative.  ____________________________________________   PHYSICAL EXAM:  VITAL SIGNS: ED Triage Vitals  Enc Vitals Group     BP 02/29/16 1859 117/85     Pulse Rate 02/29/16 1859 92     Resp 02/29/16 1859 18     Temp 02/29/16 1859 98.1 F (36.7 C)     Temp Source 02/29/16 1859 Oral     SpO2 02/29/16 1859 98 %     Weight 02/29/16 1859 170 lb (77.1 kg)     Height 02/29/16 1859 5\' 6"  (1.676 m)     Head Circumference --      Peak Flow --      Pain Score 02/29/16 1858 8     Pain Loc --      Pain Edu? --      Excl. in [place]? --     Vital signs reviewed, nursing assessments reviewed.   Constitutional:   Alert and oriented. Well appearing and in no distress. Eyes:   No scleral icterus. No conjunctival pallor. PERRL. EOMI.  No nystagmus. ENT   Head:   Normocephalic and atraumatic.   Nose:   No congestion/rhinnorhea. No septal hematoma   Mouth/Throat:   MMM, no pharyngeal erythema. No peritonsillar mass.    Neck:   No stridor. No SubQ emphysema. No meningismus. Hematological/Lymphatic/Immunilogical:   No cervical lymphadenopathy. Cardiovascular:   RRR. Symmetric bilateral radial and DP pulses.  No murmurs.  Respiratory:   Normal respiratory effort without tachypnea nor retractions. Breath sounds are clear and equal bilaterally. No wheezes/rales/rhonchi. Gastrointestinal:   Soft with generalized tenderness. No distention.. There is no CVA tenderness.  No rebound, rigidity, or guarding. Genitourinary:   deferred Musculoskeletal:   Nontender with normal range of motion in all  extremities. No joint effusions.  No lower extremity tenderness.  No edema. Neurologic:   Normal speech and language.  CN 2-10 normal. Motor grossly intact. No gross focal neurologic deficits are appreciated.  Skin:    Skin is warm, dry and intact. No rash noted.  No petechiae, purpura, or bullae.  ____________________________________________    LABS (pertinent positives/negatives) (all labs ordered are listed, but only abnormal results are displayed) Labs Reviewed  COMPREHENSIVE METABOLIC PANEL - Abnormal; Notable for the following:       Result Value   Potassium 3.4 (*)    Glucose, Bld 102 (*)    All other components within normal limits  URINALYSIS COMPLETEWITH MICROSCOPIC [facility]  ONLY) - Abnormal; Notable for the following:    Color, Urine YELLOW (*)    APPearance HAZY (*)    Ketones, ur TRACE (*)    Hgb urine dipstick 2+ (*)    Protein, ur 30 (*)    Leukocytes, UA 1+ (*)    Bacteria, UA RARE (*)    Squamous Epithelial / LPF 6-30 (*)    All other components within normal limits  URINE CULTURE  LIPASE, BLOOD  CBC  POC URINE PREG, ED  POCT PREGNANCY, URINE   ____________________________________________   EKG    ____________________________________________    RADIOLOGY  CT abdomen pelvis consistent with mesenteric edema or inflammation, mild  ____________________________________________   PROCEDURES Procedures  ____________________________________________   INITIAL IMPRESSION / ASSESSMENT AND PLAN / ED COURSE  Pertinent labs & imaging results that were available during my care of the patient were reviewed by me and considered in my medical decision making (see chart for details).  Patient well appearing no acute distress, smiling and in good spirits. Exam is nonfocal. With hematuria and obtained a CT scan of the abdomen pelvis with this consistent with some diffuse inflammatory changes. Given the chronicity of the symptoms are the past 2 weeks, started  the patient a course of Cipro and Flagyl, follow up with primary care, Zofran as needed. Bentyl as needed.Considering the patient's symptoms, medical history, and physical examination today, I have low suspicion for cholecystitis or biliary pathology, pancreatitis, perforation or bowel obstruction, hernia, intra-abdominal abscess, AAA or dissection, volvulus or intussusception, mesenteric ischemia, or appendicitis. Labs and vital signs unremarkable.     Clinical Course   ____________________________________________   FINAL CLINICAL IMPRESSION(S) / ED DIAGNOSES  Final diagnoses:  Generalized abdominal pain  Enteritis       Portions of this note were generated with dragon dictation software. Dictation errors may occur despite best attempts at proofreading.    Carrie Mew, MD 03/01/16 [phone]

## 2016-03-01 NOTE — ED Notes (Signed)
Pt resting quietly, cont to monitor °

## 2016-03-01 NOTE — ED Notes (Signed)
Pt given gingerale for po challenge, pt states that she's been drinking fine so she doesn't anticipate an issue, no distress, cont to monitor

## 2016-03-02 LAB — URINE CULTURE

## 2016-03-22 ENCOUNTER — Encounter: Payer: Self-pay | Admitting: Internal Medicine

## 2016-03-23 DIAGNOSIS — G43719 Chronic migraine without aura, intractable, without status migrainosus: Secondary | ICD-10-CM | POA: Diagnosis not present

## 2016-05-12 ENCOUNTER — Other Ambulatory Visit: Payer: Self-pay

## 2016-05-12 MED ORDER — SERTRALINE HCL 50 MG PO TABS: 50.0000 mg | ORAL_TABLET | Freq: Every day | ORAL | 1 refills | Status: DC

## 2016-06-04 DIAGNOSIS — G43719 Chronic migraine without aura, intractable, without status migrainosus: Secondary | ICD-10-CM | POA: Diagnosis not present

## 2016-06-29 ENCOUNTER — Telehealth: Payer: BLUE CROSS/BLUE SHIELD | Admitting: Family

## 2016-06-29 ENCOUNTER — Telehealth: Payer: Self-pay | Admitting: Internal Medicine

## 2016-06-29 DIAGNOSIS — R112 Nausea with vomiting, unspecified: Secondary | ICD-10-CM

## 2016-06-29 DIAGNOSIS — G43001 Migraine without aura, not intractable, with status migrainosus: Secondary | ICD-10-CM

## 2016-06-29 MED ORDER — PREDNISONE 10 MG (21) PO TBPK: 10.0000 mg | ORAL_TABLET | Freq: Every day | ORAL | 0 refills | Status: DC

## 2016-06-29 NOTE — Telephone Encounter (Signed)
[place]  Patient Name: Robin West  DOB: [birth year]    Initial Comment Caller states she is on day 4 of migraine, neurologist has no on call until January.   Nurse Assessment  Nurse: Wayne Sever, RN, Tillie Rung Date/Time (Eastern Time): 06/29/2016 10:43:47 AM  Confirm and document reason for call. If symptomatic, describe symptoms. ---Caller states she has had a migraine for 6 days. She states typically she does a 6 day steroid taper. She just did this 2 weeks ago. She states her doctor is not in today.  Does the patient have any new or worsening symptoms? ---Yes  Will a triage be completed? ---Yes  Related visit to physician within the last 2 weeks? ---No  Does the PT have any chronic conditions? (i.e. diabetes, asthma, etc.) ---No  Is the patient pregnant or possibly pregnant? (Ask all females between the ages of 59-55) ---No  Is this a behavioral health or substance abuse call? ---No     Guidelines    Guideline Title Affirmed Question Affirmed Notes  Headache [1] SEVERE headache (e.g., excruciating) AND [2] not improved after 2 hours of pain medicine    Final Disposition User   See Physician within 4 Hours (or PCP triage) Wayne Sever, RN, Tillie Rung    Comments  Sent caller instructions for TeleHealth since no appointments left in office and I also checked [place] office for her. She going to do a telehealth visit   Referrals  REFERRED TO PCP OFFICE   Disagree/Comply: Comply

## 2016-06-29 NOTE — Progress Notes (Signed)
We are sorry that you are not feeling well. Here is how we plan to help!  Based on what you have shared with me it looks like you have a Virus that is irritating your GI tract.  Vomiting is the forceful emptying of a portion of the stomach's content through the mouth.  Although nausea and vomiting can make you feel miserable, it's important to remember that these are not diseases, but rather symptoms of an underlying illness.  When we treat short term symptoms, we always caution that any symptoms that persist should be fully evaluated in a medical office.  I have prescribed a medication that will help alleviate your symptoms and allow you to stay hydrated:  Continue Phenergan. Sterapred sent to the pharmacy as well.  HOME CARE:  Drink clear liquids.  This is very important! Dehydration (the lack of fluid) can lead to a serious complication.  Start off with 1 tablespoon every 5 minutes for 8 hours.  You may begin eating bland foods after 8 hours without vomiting.  Start with saltine crackers, white bread, rice, mashed potatoes, applesauce.  After 48 hours on a bland diet, you may resume a normal diet.  Try to go to sleep.  Sleep often empties the stomach and relieves the need to vomit.  GET HELP RIGHT AWAY IF:   Your symptoms do not improve or worsen within 2 days after treatment.  You have a fever for over 3 days.  You cannot keep down fluids after trying the medication.  MAKE SURE YOU:   Understand these instructions.  Will watch your condition.  Will get help right away if you are not doing well or get worse.   Thank you for choosing an e-visit. Your e-visit answers were reviewed by a board certified advanced clinical practitioner to complete your personal care plan. Depending upon the condition, your plan could have included both over the counter or prescription medications. Please review your pharmacy choice. Be sure that the pharmacy you have chosen is open so that you can  pick up your prescription now.  If there is a problem you may message your provider in [place] to have the prescription routed to another pharmacy. Your safety is important to [name]. If you have drug allergies check your prescription carefully.  For the next 24 hours, you can use MyChart to ask questions about today's visit, request a non-urgent call back, or ask for a work or school excuse from your e-visit provider. You will get an e-mail in the next two days asking about your experience. I hope that your e-visit has been valuable and will speed your recovery.

## 2016-06-30 NOTE — Telephone Encounter (Signed)
noted 

## 2016-06-30 NOTE — Telephone Encounter (Signed)
Has she made an appt to be seen?

## 2016-06-30 NOTE — Telephone Encounter (Signed)
Pt had an evisit where they prescribed phenergan and pred taper

## 2016-07-09 DIAGNOSIS — G43719 Chronic migraine without aura, intractable, without status migrainosus: Secondary | ICD-10-CM | POA: Diagnosis not present

## 2016-08-16 ENCOUNTER — Encounter: Payer: Self-pay | Admitting: Internal Medicine

## 2016-08-16 ENCOUNTER — Other Ambulatory Visit: Payer: Self-pay | Admitting: Internal Medicine

## 2016-08-16 MED ORDER — OSELTAMIVIR PHOSPHATE 75 MG PO CAPS: 75.0000 mg | ORAL_CAPSULE | Freq: Two times a day (BID) | ORAL | 0 refills | Status: DC

## 2016-08-16 NOTE — Progress Notes (Signed)
tami

## 2016-08-24 ENCOUNTER — Encounter: Payer: Self-pay | Admitting: Internal Medicine

## 2016-08-26 ENCOUNTER — Encounter: Payer: Self-pay | Admitting: Internal Medicine

## 2016-08-26 ENCOUNTER — Ambulatory Visit (INDEPENDENT_AMBULATORY_CARE_PROVIDER_SITE_OTHER): Payer: BLUE CROSS/BLUE SHIELD | Admitting: Internal Medicine

## 2016-08-26 VITALS — BP 116/80 | HR 85 | Temp 98.3°F | Wt 177.0 lb

## 2016-08-26 DIAGNOSIS — J069 Acute upper respiratory infection, unspecified: Secondary | ICD-10-CM

## 2016-08-26 DIAGNOSIS — H66001 Acute suppurative otitis media without spontaneous rupture of ear drum, right ear: Secondary | ICD-10-CM

## 2016-08-26 MED ORDER — AZITHROMYCIN 250 MG PO TABS: ORAL_TABLET | ORAL | 0 refills | Status: DC

## 2016-08-26 NOTE — Progress Notes (Signed)
HPI  Pt presents to the clinic today with c/o runny nose, ear fullness and sore throat. This started 4-5 days ago. She is blowing clear/green mucous out of her nose. She does have intermittent ear pain but denies decreased hearing. She has had some difficulty swallowing. She denies fever, chills or body aches. She has tried Mucinex, Zyrtec and Benadryl with minimal relief. She has a history of allergies but denies breathing problems. She has not had sick contacts that she is aware of. She did not get her flu shot.  Review of Systems        Past Medical History:  Diagnosis Date  . Allergy   . Chicken pox   . Frequent headaches   . IBS (irritable bowel syndrome)     Family History  Problem Relation Age of Onset  . Arthritis Father   . Hyperlipidemia Father   . Hypertension Father   . Arthritis Maternal Grandmother   . Stroke Maternal Grandfather   . Cancer Paternal Grandmother     Ovarian   . Cancer Paternal Aunt     ovarian cancer    Social History   Social History  . Marital status: Married    Spouse name: N/A  . Number of children: N/A  . Years of education: N/A   Occupational History  . Not on file.   Social History Main Topics  . Smoking status: Never Smoker  . Smokeless tobacco: Never Used  . Alcohol use 0.0 oz/week     Comment: rare  . Drug use: No  . Sexual activity: Yes    Birth control/ protection: Pill   Other Topics Concern  . Not on file   Social History Narrative  . No narrative on file    No Known Allergies   Constitutional: Denies headache, fatigue, fever or abrupt weight changes.  HEENT:  Positive ear pain, runny nose, sore throat. Denies eye redness, eye pain, pressure behind the eyes, facial pain, nasal congestion, ringing in the ears, wax buildup, or bloody nose. Respiratory:  Denies cough, difficulty breathing or shortness of breath.  Cardiovascular: Denies chest pain, chest tightness, palpitations or swelling in the hands or feet.    No other specific complaints in a complete review of systems (except as listed in HPI above).  Objective:   BP 116/80   Pulse 85   Temp 98.3 F (36.8 C) (Oral)   Wt 177 lb (80.3 kg)   SpO2 98%   BMI 28.57 kg/m  Wt Readings from Last 3 Encounters:  08/26/16 177 lb (80.3 kg)  02/29/16 170 lb (77.1 kg)  02/27/16 169 lb (76.7 kg)     General: Appears her stated age, well developed, well nourished in NAD. HEENT: Head: normal shape and size, no sinus tenderness noted; Ears: Tm's red but intact, distorted light reflex; Nose: mucosa pink and moist, septum midline; Throat/Mouth: + PND. Teeth present, mucosa erythematous and moist, no exudate noted, no lesions or ulcerations noted.  Neck: No cervical lymphadenopathy.  Cardiovascular: Normal rate and rhythm. S1,S2 noted.  No murmur, rubs or gallops noted.  Pulmonary/Chest: Normal effort and positive vesicular breath sounds. No respiratory distress. No wheezes, rales or ronchi noted.       Assessment & Plan:   Upper Respiratory Infection with Right Otitis Media:  Get some rest and drink plenty of water Do salt water gargles for the sore throat eRx for Azithromax x 5 days Continue Zyrtec, add in Flonase OTC  RTC as needed or if symptoms  persist.   Webb Silversmith, NP

## 2016-08-26 NOTE — Patient Instructions (Signed)
Upper Respiratory Infection, Adult Most upper respiratory infections (URIs) are caused by a virus. A URI affects the nose, throat, and upper air passages. The most common type of URI is often called "the common cold." Follow these instructions at home:  Take medicines only as told by your doctor.  Gargle warm saltwater or take cough drops to comfort your throat as told by your doctor.  Use a warm mist humidifier or inhale steam from a shower to increase air moisture. This may make it easier to breathe.  Drink enough fluid to keep your pee (urine) clear or pale yellow.  Eat soups and other clear broths.  Have a healthy diet.  Rest as needed.  Go back to work when your fever is gone or your doctor says it is okay.  You may need to stay home longer to avoid giving your URI to others.  You can also wear a face mask and wash your hands often to prevent spread of the virus.  Use your inhaler more if you have asthma.  Do not use any tobacco products, including cigarettes, chewing tobacco, or electronic cigarettes. If you need help quitting, ask your doctor. Contact a doctor if:  You are getting worse, not better.  Your symptoms are not helped by medicine.  You have chills.  You are getting more short of breath.  You have brown or red mucus.  You have yellow or brown discharge from your nose.  You have pain in your face, especially when you bend forward.  You have a fever.  You have puffy (swollen) neck glands.  You have pain while swallowing.  You have white areas in the back of your throat. Get help right away if:  You have very bad or constant:  Headache.  Ear pain.  Pain in your forehead, behind your eyes, and over your cheekbones (sinus pain).  Chest pain.  You have long-lasting (chronic) lung disease and any of the following:  Wheezing.  Long-lasting cough.  Coughing up blood.  A change in your usual mucus.  You have a stiff neck.  You have  changes in your:  Vision.  Hearing.  Thinking.  Mood. This information is not intended to replace advice given to you by your health care provider. Make sure you discuss any questions you have with your health care provider. Document Released: 12/08/2007 Document Revised: 02/22/2016 Document Reviewed: 09/26/2013 Elsevier Interactive Patient Education  2017 Elsevier Inc.  

## 2016-08-30 ENCOUNTER — Encounter: Payer: Self-pay | Admitting: Internal Medicine

## 2016-08-30 MED ORDER — PREDNISONE 10 MG PO TABS: ORAL_TABLET | ORAL | 0 refills | Status: DC

## 2016-09-30 DIAGNOSIS — Z3041 Encounter for surveillance of contraceptive pills: Secondary | ICD-10-CM | POA: Diagnosis not present

## 2016-09-30 DIAGNOSIS — G43009 Migraine without aura, not intractable, without status migrainosus: Secondary | ICD-10-CM | POA: Diagnosis not present

## 2016-09-30 DIAGNOSIS — Z01419 Encounter for gynecological examination (general) (routine) without abnormal findings: Secondary | ICD-10-CM | POA: Diagnosis not present

## 2016-09-30 DIAGNOSIS — Z6826 Body mass index (BMI) 26.0-26.9, adult: Secondary | ICD-10-CM | POA: Diagnosis not present

## 2016-09-30 LAB — HM PAP SMEAR: HM Pap smear: NORMAL

## 2016-11-09 DIAGNOSIS — G43719 Chronic migraine without aura, intractable, without status migrainosus: Secondary | ICD-10-CM | POA: Diagnosis not present

## 2016-11-10 ENCOUNTER — Other Ambulatory Visit: Payer: Self-pay | Admitting: Internal Medicine

## 2016-12-14 ENCOUNTER — Encounter: Payer: Self-pay | Admitting: Internal Medicine

## 2016-12-14 ENCOUNTER — Ambulatory Visit (INDEPENDENT_AMBULATORY_CARE_PROVIDER_SITE_OTHER): Payer: BLUE CROSS/BLUE SHIELD | Admitting: Internal Medicine

## 2016-12-14 VITALS — BP 110/70 | HR 79 | Temp 97.9°F | Ht 65.75 in | Wt 170.5 lb

## 2016-12-14 DIAGNOSIS — J301 Allergic rhinitis due to pollen: Secondary | ICD-10-CM | POA: Diagnosis not present

## 2016-12-14 DIAGNOSIS — R1031 Right lower quadrant pain: Secondary | ICD-10-CM | POA: Diagnosis not present

## 2016-12-14 DIAGNOSIS — K582 Mixed irritable bowel syndrome: Secondary | ICD-10-CM

## 2016-12-14 DIAGNOSIS — R51 Headache: Secondary | ICD-10-CM

## 2016-12-14 DIAGNOSIS — Z0000 Encounter for general adult medical examination without abnormal findings: Secondary | ICD-10-CM

## 2016-12-14 DIAGNOSIS — R519 Headache, unspecified: Secondary | ICD-10-CM

## 2016-12-14 DIAGNOSIS — L989 Disorder of the skin and subcutaneous tissue, unspecified: Secondary | ICD-10-CM | POA: Diagnosis not present

## 2016-12-14 LAB — POC URINALSYSI DIPSTICK (AUTOMATED)
BILIRUBIN UA: NEGATIVE
GLUCOSE UA: NEGATIVE
Leukocytes, UA: NEGATIVE
Nitrite, UA: NEGATIVE
RBC UA: NEGATIVE
Urobilinogen, UA: 0.2 E.U./dL
pH, UA: 6 (ref 5.0–8.0)

## 2016-12-14 LAB — HEMOGLOBIN A1C: Hgb A1c MFr Bld: 5.6 % (ref 4.6–6.5)

## 2016-12-14 LAB — LIPID PANEL
CHOL/HDL RATIO: 5
Cholesterol: 197 mg/dL (ref 0–200)
HDL: 39.3 mg/dL (ref >39.00)
LDL CALC: 140 mg/dL — AB (ref 0–99)
NONHDL: 157.37
Triglycerides: 89 mg/dL (ref 0.0–149.0)
VLDL: 17.8 mg/dL (ref 0.0–40.0)

## 2016-12-14 LAB — CBC
HEMATOCRIT: 37.1 % (ref 36.0–46.0)
HEMOGLOBIN: 12.5 g/dL (ref 12.0–15.0)
MCHC: 33.8 g/dL (ref 30.0–36.0)
MCV: 89.4 fl (ref 78.0–100.0)
Platelets: 319 10*3/uL (ref 150.0–400.0)
RBC: 4.15 Mil/uL (ref 3.87–5.11)
RDW: 13.1 % (ref 11.5–15.5)
WBC: 8.4 10*3/uL (ref 4.0–10.5)

## 2016-12-14 LAB — COMPREHENSIVE METABOLIC PANEL
ALT: 16 U/L (ref 0–35)
AST: 13 U/L (ref 0–37)
Albumin: 4.3 g/dL (ref 3.5–5.2)
Alkaline Phosphatase: 41 U/L (ref 39–117)
BUN: 24 mg/dL — ABNORMAL HIGH (ref 6–23)
CHLORIDE: 105 meq/L (ref 96–112)
CO2: 27 meq/L (ref 19–32)
Calcium: 9.5 mg/dL (ref 8.4–10.5)
Creatinine, Ser: 0.55 mg/dL (ref 0.40–1.20)
GFR: 131.43 mL/min (ref >60.00)
GLUCOSE: 91 mg/dL (ref 70–99)
POTASSIUM: 4.3 meq/L (ref 3.5–5.1)
Sodium: 139 mEq/L (ref 135–145)
Total Bilirubin: 0.3 mg/dL (ref 0.2–1.2)
Total Protein: 7.2 g/dL (ref 6.0–8.3)

## 2016-12-14 NOTE — Patient Instructions (Signed)

## 2016-12-14 NOTE — Assessment & Plan Note (Signed)
Chronic but stable Continue Bentyl Will monitor

## 2016-12-14 NOTE — Assessment & Plan Note (Addendum)
Continue antihistamine as needed Will monitor

## 2016-12-14 NOTE — Progress Notes (Signed)
Subjective:    Patient ID: Robin West, female    DOB: June 28, 1981, 36 y.o.   MRN: [id number]  HPI  Pt presents to the clinic today for her annual exam. She is also due to follow up chronic conditions.  Seasonal Allergies: Worse in the spring. She no longer takes Pazeo. She takes an OTC antihistamine as needed with good relief.  Frequent Headaches: She follows with Dr. Manuella Ghazi, neurology - note from 11/2016 reviewed. She takes Zofran, Imitrex and Phenergan as needed. She is getting SPG blocks every 4 months.  IBS: She has alternating constipation and diarrhea but this has improved since she has been on her Keto diet. She takes Zoloft as prescribed with good relief. She no longer really takes Bentyl.  Flu: 09/2015 Tetanus: 2014 Pap Smear: 11/2015 [place] Dentist: biannually  Diet: She is currently on a Keto Diet. She does eat meat. She consumes some more veggies than fruit. She does not consume any fried foods. She drinks mostly water, some coffee. Exercise: She does 30 minutes cardio every day.  Review of Systems      Past Medical History:  Diagnosis Date  . Allergy   . Chicken pox   . Frequent headaches   . IBS (irritable bowel syndrome)     Current Outpatient Prescriptions  Medication Sig Dispense Refill  . azithromycin (ZITHROMAX) 250 MG tablet Take 2 tabs today, then 1 tab daily x 4 days 6 tablet 0  . dicyclomine (BENTYL) 20 MG tablet Take 20 mg by mouth as needed for spasms.    . hydrocortisone (ANUSOL-HC) 25 MG suppository Place 1 suppository (25 mg total) rectally 2 (two) times daily. 12 suppository 0  . LO LOESTRIN FE 1 MG-10 MCG / 10 MCG tablet Take 1 tablet by mouth daily.  12  . Nutritional Supplements (JUICE PLUS FIBRE PO) Take 6 capsules by mouth daily.    . ondansetron (ZOFRAN ODT) 4 MG disintegrating tablet Take 1 tablet (4 mg total) by mouth every 8 (eight) hours as needed for nausea or vomiting. 20 tablet 0  . ondansetron (ZOFRAN) 4 MG  tablet Take 1 tablet (4 mg total) by mouth every 8 (eight) hours as needed. 20 tablet 0  . PAZEO 0.7 % SOLN Place 1 drop into both eyes. For 14 days as needed for allergies  2  . predniSONE (DELTASONE) 10 MG tablet Take 3 tabs on days 1-2, take 2 tabs on days 3-4, take 1 tab on days 5-6 12 tablet 0  . promethazine (PHENERGAN) 25 MG tablet TAKE 1 TABLET (25 MG TOTAL) BY MOUTH EVERY 6 (SIX) HOURS AS NEEDED FOR NAUSEA.  3  . sertraline (ZOLOFT) 50 MG tablet TAKE 1 TABLET (50 MG TOTAL) BY MOUTH DAILY. 90 tablet 0  . SUMAtriptan (IMITREX) 100 MG tablet Take 1 tablet by mouth. For migraine may repeat in 2hrs if not better Max 2/24hr  2  . SUMAtriptan 6 MG/0.5ML SOAJ INJECT 0.5MLS SUBCUTANEOUSLY ONCE AS NEEDED FOR MIGRAINE. MAY REPEAT IN 1 HOUR IF NEEDED  3   No current facility-administered medications for this visit.     No Known Allergies  Family History  Problem Relation Age of Onset  . Arthritis Father   . Hyperlipidemia Father   . Hypertension Father   . Arthritis Maternal Grandmother   . Stroke Maternal Grandfather   . Cancer Paternal Grandmother        Ovarian   . Cancer Paternal Aunt  ovarian cancer    Social History   Social History  . Marital status: Married    Spouse name: N/A  . Number of children: N/A  . Years of education: N/A   Occupational History  . Not on file.   Social History Main Topics  . Smoking status: Never Smoker  . Smokeless tobacco: Never Used  . Alcohol use 0.0 oz/week     Comment: rare  . Drug use: No  . Sexual activity: Yes    Birth control/ protection: Pill   Other Topics Concern  . Not on file   Social History Narrative  . No narrative on file     Constitutional: Denies fever, malaise, fatigue, headache or abrupt weight changes.  HEENT: Denies eye pain, eye redness, ear pain, ringing in the ears, wax buildup, runny nose, nasal congestion, bloody nose, or sore throat. Respiratory: Denies difficulty breathing, shortness of breath,  cough or sputum production.   Cardiovascular: Denies chest pain, chest tightness, palpitations or swelling in the hands or feet.  Gastrointestinal: Pt reports RLQ pain only when urinating. Denies bloating, constipation, diarrhea or blood in the stool.  GU: Denies urgency, frequency, pain with urination, burning sensation, blood in urine, odor or discharge. Musculoskeletal: Denies decrease in range of motion, difficulty with gait, muscle pain or joint pain and swelling.  Skin: Pt reports skin lesions of right hand. Denies redness, rashes, or ulcercations.  Neurological: Denies dizziness, difficulty with memory, difficulty with speech or problems with balance and coordination.  Psych: Denies anxiety, depression, SI/HI.  No other specific complaints in a complete review of systems (except as listed in HPI above).  Objective:   Physical Exam  BP 110/70   Pulse 79   Temp 97.9 F (36.6 C) (Oral)   Ht 5' 5.75" (1.67 m)   Wt 170 lb 8 oz (77.3 kg)   LMP 11/16/2016   SpO2 99%   BMI 27.73 kg/m  Wt Readings from Last 3 Encounters:  12/14/16 170 lb 8 oz (77.3 kg)  08/26/16 177 lb (80.3 kg)  02/29/16 170 lb (77.1 kg)    General: Appears her stated age, well developed, well nourished in NAD. Skin: Warm, dry and intact. Small scaly skin colored lesion noted on dorsal surface right hand. Small, smooth skin colored lesion noted on palmar surface right hand. HEENT: Head: normal shape and size; Eyes: sclera white, no icterus, conjunctiva pink, PERRLA and EOMs intact; Ears: Tm's gray and intact, normal light reflex; Throat/Mouth: Teeth present, mucosa pink and moist, no exudate, lesions or ulcerations noted.  Neck:  Neck supple, trachea midline. No masses, lumps or thyromegaly present.  Cardiovascular: Normal rate and rhythm. S1,S2 noted.  No murmur, rubs or gallops noted. No JVD or BLE edema.  Pulmonary/Chest: Normal effort and positive vesicular breath sounds. No respiratory distress. No wheezes,  rales or ronchi noted.  Abdomen: Soft and nontender. Normal bowel sounds. No distention or masses noted. Liver, spleen and kidneys non palpable. Musculoskeletal: Strength 5/5 BUE/BLE. No difficulty with gait.  Neurological: Alert and oriented. Cranial nerves II-XII grossly intact. Coordination normal.  Psychiatric: Mood and affect normal. Behavior is normal. Judgment and thought content normal.     BMET    Component Value Date/Time   NA 137 02/29/2016 1902   K 3.4 (L) 02/29/2016 1902   CL 106 02/29/2016 1902   CO2 25 02/29/2016 1902   GLUCOSE 102 (H) 02/29/2016 1902   BUN 20 02/29/2016 1902   CREATININE 0.48 02/29/2016 1902   CALCIUM  9.0 02/29/2016 1902   GFRNONAA >60 02/29/2016 1902   GFRAA >60 02/29/2016 1902    Lipid Panel     Component Value Date/Time   CHOL 210 (H) 12/10/2015 1508   TRIG 119.0 12/10/2015 1508   HDL 47.80 12/10/2015 1508   CHOLHDL 4 12/10/2015 1508   VLDL 23.8 12/10/2015 1508   LDLCALC 138 (H) 12/10/2015 1508    CBC    Component Value Date/Time   WBC 7.7 02/29/2016 1902   RBC 4.10 02/29/2016 1902   HGB 12.7 02/29/2016 1902   HCT 36.4 02/29/2016 1902   PLT 338 02/29/2016 1902   MCV 88.8 02/29/2016 1902   MCH 31.0 02/29/2016 1902   MCHC 34.9 02/29/2016 1902   RDW 12.5 02/29/2016 1902    Hgb A1C No results found for: HGBA1C          Assessment & Plan:   Preventative Health Maintenance:  Encouraged her to get a flu shot in the fall Tetanus UTD Pap smear UTD Encouraged her to consume a balanced diet and exercise regimen Advised her to see a dentist at least annually Will check CBC, CMET, Lipid and A1C today  RLQ Pain:  Urinalysis: 2+ ketones, trace protein Push fluids Let me know if persists  Skin Lesion of Hand:  She has already made an appt with dermatology for further evaluation  RTC in 1 year, sooner if needed Webb Silversmith, NP

## 2016-12-14 NOTE — Addendum Note (Signed)
Addended by: Lurlean Nanny on: 12/14/2016 11:19 AM   Modules accepted: Orders

## 2016-12-14 NOTE — Assessment & Plan Note (Signed)
Chronic but stable She will continue current meds She will continue to follow with Dr. Manuella Ghazi

## 2017-01-18 ENCOUNTER — Encounter: Payer: Self-pay | Admitting: Internal Medicine

## 2017-01-26 ENCOUNTER — Encounter: Payer: Self-pay | Admitting: Internal Medicine

## 2017-02-07 ENCOUNTER — Other Ambulatory Visit: Payer: Self-pay | Admitting: Internal Medicine

## 2017-02-09 DIAGNOSIS — D229 Melanocytic nevi, unspecified: Secondary | ICD-10-CM | POA: Diagnosis not present

## 2017-02-09 DIAGNOSIS — D485 Neoplasm of uncertain behavior of skin: Secondary | ICD-10-CM | POA: Diagnosis not present

## 2017-02-09 DIAGNOSIS — B078 Other viral warts: Secondary | ICD-10-CM | POA: Diagnosis not present

## 2017-02-09 DIAGNOSIS — D2271 Melanocytic nevi of right lower limb, including hip: Secondary | ICD-10-CM | POA: Diagnosis not present

## 2017-02-09 DIAGNOSIS — D225 Melanocytic nevi of trunk: Secondary | ICD-10-CM | POA: Diagnosis not present

## 2017-02-09 DIAGNOSIS — L821 Other seborrheic keratosis: Secondary | ICD-10-CM | POA: Diagnosis not present

## 2017-02-09 DIAGNOSIS — D239 Other benign neoplasm of skin, unspecified: Secondary | ICD-10-CM

## 2017-02-09 DIAGNOSIS — D1800 Hemangioma unspecified site: Secondary | ICD-10-CM | POA: Diagnosis not present

## 2017-02-09 DIAGNOSIS — L812 Freckles: Secondary | ICD-10-CM | POA: Diagnosis not present

## 2017-02-09 HISTORY — DX: Other benign neoplasm of skin, unspecified: D23.9

## 2017-02-12 ENCOUNTER — Ambulatory Visit (INDEPENDENT_AMBULATORY_CARE_PROVIDER_SITE_OTHER): Payer: BLUE CROSS/BLUE SHIELD

## 2017-02-12 ENCOUNTER — Ambulatory Visit (HOSPITAL_COMMUNITY)
Admission: EM | Admit: 2017-02-12 | Discharge: 2017-02-12 | Disposition: A | Discharge status: Home / Self Care | Payer: BLUE CROSS/BLUE SHIELD | Attending: Family Medicine | Admitting: Family Medicine

## 2017-02-12 ENCOUNTER — Encounter (HOSPITAL_COMMUNITY): Payer: Self-pay | Admitting: Emergency Medicine

## 2017-02-12 DIAGNOSIS — R059 Cough, unspecified: Secondary | ICD-10-CM

## 2017-02-12 DIAGNOSIS — R091 Pleurisy: Secondary | ICD-10-CM | POA: Diagnosis not present

## 2017-02-12 DIAGNOSIS — R05 Cough: Secondary | ICD-10-CM | POA: Diagnosis not present

## 2017-02-12 DIAGNOSIS — R079 Chest pain, unspecified: Secondary | ICD-10-CM | POA: Diagnosis not present

## 2017-02-12 MED ORDER — NAPROXEN 500 MG PO TABS: 500.0000 mg | ORAL_TABLET | Freq: Two times a day (BID) | ORAL | 0 refills | Status: DC

## 2017-02-12 MED ORDER — BENZONATATE 100 MG PO CAPS: 100.0000 mg | ORAL_CAPSULE | Freq: Three times a day (TID) | ORAL | 0 refills | Status: DC

## 2017-02-12 NOTE — ED Triage Notes (Signed)
Pt c/o dry cough x 3 weeks. Progressively getting worse. This morning woke up with hoarseness and right side rib pain. Pt reports she thought she had some acid reflux and took some Zantac last night with no relief.

## 2017-02-12 NOTE — ED Provider Notes (Signed)
[facility]    CSN: [id number] Arrival date & time: 02/12/17  1617     History   Chief Complaint Chief Complaint  Patient presents with  . Cough    HPI Sommer Spickard is a 36 y.o. female.   HPI 36 year old female with history of IBS and frequent headaches presents with three-week history of dry cough. She reports cough is worsened over the past 3 weeks. Cough is worse at night so she takes Benadryl to sleep. This  morning she had associated hoarseness, dyspnea with exertion and right-sided rib pain. Rib pain did not resolve with a dose of Zantac.   She does take oral contraceptives. She denies recent surgery, prolonged travel, personal or family history of venous double embolism. She denies sick contacts. She denies fever, chills, nausea and emesis.  Past Medical History:  Diagnosis Date  . Allergy   . Chicken pox   . Frequent headaches   . IBS (irritable bowel syndrome)     Patient Active Problem List   Diagnosis Date Noted  . Frequent headaches 10/22/2014  . IBS (irritable bowel syndrome) 10/22/2014  . Hemorrhoids, internal 10/22/2014  . Seasonal allergies 10/22/2014    Past Surgical History:  Procedure Laterality Date  . GANGLION CYST EXCISION Left 01/08/2016   Procedure: EXCISION DORSAL GANGLION LEFT WRIST;  Surgeon: Daryll Brod, MD;  Location: [facility];  Service: Orthopedics;  Laterality: Left;  . HEMORROIDECTOMY  2004  . TONSILLECTOMY  2010  . WISDOM TOOTH EXTRACTION      OB History    No data available       Home Medications    Prior to Admission medications   Medication Sig Start Date End Date Taking? Authorizing Provider  LO LOESTRIN FE 1 MG-10 MCG / 10 MCG tablet Take 1 tablet by mouth daily. 10/08/14   [provider]  Nutritional Supplements (JUICE PLUS FIBRE PO) Take 6 capsules by mouth daily.    [provider]  promethazine (PHENERGAN) 25 MG tablet TAKE 1 TABLET (25 MG TOTAL) BY MOUTH EVERY 6 (SIX)  HOURS AS NEEDED FOR NAUSEA. 11/20/15   [provider]  sertraline (ZOLOFT) 50 MG tablet TAKE 1 TABLET (50 MG TOTAL) BY MOUTH DAILY. 02/07/17   Jearld Fenton, NP  SUMAtriptan (IMITREX) 100 MG tablet Take 1 tablet by mouth. For migraine may repeat in 2hrs if not better Max 2/24hr 09/18/14   [provider]  SUMAtriptan 6 MG/0.5ML SOAJ INJECT 0.5MLS SUBCUTANEOUSLY ONCE AS NEEDED FOR MIGRAINE. MAY REPEAT IN 1 HOUR IF NEEDED 11/21/15   [provider]    Family History Family History  Problem Relation Age of Onset  . Arthritis Father   . Hyperlipidemia Father   . Hypertension Father   . Arthritis Maternal Grandmother   . Stroke Maternal Grandfather   . Cancer Paternal Grandmother        Ovarian   . Cancer Paternal Aunt        ovarian cancer    Social History Social History  Substance Use Topics  . Smoking status: Never Smoker  . Smokeless tobacco: Never Used  . Alcohol use 0.0 oz/week     Comment: rare     Allergies   Patient has no known allergies.   Review of Systems Review of Systems  Constitutional: Negative for activity change, appetite change, chills and fever.  HENT: Positive for voice change. Negative for congestion.   Eyes: Negative for visual disturbance.  Respiratory: Positive for cough  and shortness of breath.   Cardiovascular: Positive for chest pain.  Gastrointestinal: Negative for abdominal pain, blood in stool, nausea and vomiting.  Musculoskeletal: Negative for arthralgias and back pain.  Skin: Negative for rash.  Allergic/Immunologic: Negative for immunocompromised state.  Hematological: Negative for adenopathy. Does not bruise/bleed easily.  Psychiatric/Behavioral: Negative for dysphoric mood and suicidal ideas.    Physical Exam Triage Vital Signs ED Triage Vitals [02/12/17 1651]  Enc Vitals Group     BP 111/81     Pulse Rate 89     Resp 16     Temp 98.7 F (37.1 C)     Temp Source Oral     SpO2 96 %     Weight       Height      Head Circumference      Peak Flow      Pain Score      Pain Loc      Pain Edu?      Excl. in [place]?    No data found.   Updated Vital Signs BP 111/81 (BP Location: Left Arm)   Pulse 89   Temp 98.7 F (37.1 C) (Oral)   Resp 16   LMP 02/05/2017 (Approximate)   SpO2 96%   Visual Acuity Right Eye Distance:   Left Eye Distance:   Bilateral Distance:    Right Eye Near:   Left Eye Near:    Bilateral Near:     Physical Exam  Constitutional: She is oriented to person, place, and time. She appears well-developed and well-nourished. No distress.  HENT:  Head: Normocephalic and atraumatic.  Neck: Normal range of motion. Neck supple.  Cardiovascular: Normal rate, regular rhythm, normal heart sounds and intact distal pulses.   Pulmonary/Chest: Effort normal and breath sounds normal.  Abdominal: Soft. Bowel sounds are normal. There is tenderness in the right upper quadrant.  Musculoskeletal: She exhibits no edema.  Lymphadenopathy:    She has no cervical adenopathy.  Neurological: She is alert and oriented to person, place, and time.  Skin: Skin is warm and dry. No rash noted.  Psychiatric: She has a normal mood and affect.     UC Treatments / Results  Labs (all labs ordered are listed, but only abnormal results are displayed) Labs Reviewed - No data to display  EKG  EKG Interpretation None       Radiology Dg Chest 2 View  Result Date: 02/12/2017 CLINICAL DATA:  Cough x3 weeks with right lower chest pain x1 day. EXAM: CHEST  2 VIEW COMPARISON:  None. FINDINGS: The heart size and mediastinal contours are within normal limits. Both lungs are clear. The visualized skeletal structures are unremarkable. IMPRESSION: No active cardiopulmonary disease. Electronically Signed   By: Ashley Royalty M.D.   On: 02/12/2017 18:16    Procedures Procedures (including critical care time)  Medications Ordered in UC Medications - No data to display   Initial Impression /  Assessment and Plan / UC Course  I have reviewed the triage vital signs and the nursing notes.  Pertinent labs & imaging results that were available during my care of the patient were reviewed by me and considered in my medical decision making (see chart for details).  Clinical Course as of Feb 13 1831  Sat Feb 12, 2017  1831 DG Chest 2 View [JF]    Clinical Course User Index [JF] Boykin Nearing, MD    Final Clinical Impressions(s) / UC Diagnoses   Final diagnoses:  Cough  Pleurisy  Patient with dry cough and chest pain normal chest x-ray. Low likelihood of pulmonary embolus.   Plan for Tessalon Perles for cough and naproxen for pleurisy.   Information about pleurisy provided. Reviewed signs and symptoms that should prompt return to care.  New Prescriptions New Prescriptions   BENZONATATE (TESSALON) 100 MG CAPSULE    Take 1 capsule (100 mg total) by mouth every 8 (eight) hours.   NAPROXEN (NAPROSYN) 500 MG TABLET    Take 1 tablet (500 mg total) by mouth 2 (two) times daily with a meal. For next 3-5 days then as needed     Controlled Substance Prescriptions [place] Controlled Substance Registry consulted? Not Applicable   Boykin Nearing, MD 02/12/17 [phone]

## 2017-02-12 NOTE — Discharge Instructions (Signed)
Your chest x-ray is normal today. Suspicion for pulmonary embolus is very low. Your symptoms are consistent with pleurisy. This is most often caused by a viral illness. Please take Tessalon Perles to suppress cough. Please take naproxen 500 mg twice daily with food for the next 3 days then as needed for chest pain.  Symptoms worsen or fail to improve please return or present to the he primary care provider for reevaluation.

## 2017-03-14 DIAGNOSIS — G43719 Chronic migraine without aura, intractable, without status migrainosus: Secondary | ICD-10-CM | POA: Diagnosis not present

## 2017-04-22 ENCOUNTER — Encounter: Payer: Self-pay | Admitting: Internal Medicine

## 2017-06-29 ENCOUNTER — Encounter: Payer: Self-pay | Admitting: Internal Medicine

## 2017-07-14 ENCOUNTER — Encounter: Payer: Self-pay | Admitting: Internal Medicine

## 2017-07-14 ENCOUNTER — Ambulatory Visit: Payer: BLUE CROSS/BLUE SHIELD | Admitting: Internal Medicine

## 2017-07-14 VITALS — BP 108/76 | HR 83 | Temp 98.2°F | Wt 161.0 lb

## 2017-07-14 DIAGNOSIS — J329 Chronic sinusitis, unspecified: Secondary | ICD-10-CM

## 2017-07-14 DIAGNOSIS — B9789 Other viral agents as the cause of diseases classified elsewhere: Secondary | ICD-10-CM | POA: Diagnosis not present

## 2017-07-14 MED ORDER — PREDNISONE 10 MG PO TABS: ORAL_TABLET | ORAL | 0 refills | Status: DC

## 2017-07-14 NOTE — Patient Instructions (Signed)

## 2017-07-14 NOTE — Progress Notes (Signed)
HPI  Pt presents to the clinic today with c/o headache, facial pain and pressure and nasal congestion. This started 2-3 weeks ago. She is blowing clear mucous out of her nose. She denies ear pain, sore throat or cough. She denies fever, chills or body aches. She has tried Mucinex, Flonase and Sudafed with minimal relief. She has a history of allergies. She has not had sick contacts.  Review of Systems     Past Medical History:  Diagnosis Date  . Allergy   . Chicken pox   . Frequent headaches   . IBS (irritable bowel syndrome)     Family History  Problem Relation Age of Onset  . Arthritis Father   . Hyperlipidemia Father   . Hypertension Father   . Arthritis Maternal Grandmother   . Stroke Maternal Grandfather   . Cancer Paternal Grandmother        Ovarian   . Cancer Paternal Aunt        ovarian cancer    Social History   Socioeconomic History  . Marital status: Married    Spouse name: Not on file  . Number of children: Not on file  . Years of education: Not on file  . Highest education level: Not on file  Social Needs  . Financial resource strain: Not on file  . Food insecurity - worry: Not on file  . Food insecurity - inability: Not on file  . Transportation needs - medical: Not on file  . Transportation needs - non-medical: Not on file  Occupational History  . Not on file  Tobacco Use  . Smoking status: Never Smoker  . Smokeless tobacco: Never Used  Substance and Sexual Activity  . Alcohol use: Yes    Alcohol/week: 0.0 oz    Comment: rare  . Drug use: No  . Sexual activity: Yes    Birth control/protection: Pill  Other Topics Concern  . Not on file  Social History Narrative  . Not on file    No Known Allergies   Constitutional: Positive headache. Denies fatigue, fever or abrupt weight changes.  HEENT:  Positive facial pain, nasal congestiont. Denies eye redness, ear pain, ringing in the ears, wax buildup, runny nose or sore throat. Respiratory: Denies  cough, difficulty breathing or shortness of breath.  Cardiovascular: Denies chest pain, chest tightness, palpitations or swelling in the hands or feet.   No other specific complaints in a complete review of systems (except as listed in HPI above).  Objective:   BP 108/76   Pulse 83   Temp 98.2 F (36.8 C) (Oral)   Wt 161 lb (73 kg)   SpO2 98%   BMI 26.18 kg/m   General: Appears her stated age, in NAD. HEENT: Head: normal shape and size, no sinus tenderness noted; Ears: Tm's gray and intact, normal light reflex; Nose: mucosa boggy and moist, septum midline; Throat/Mouth: + PND. Teeth present, mucosa pink and moist, no exudate noted, no lesions or ulcerations noted.  Neck:  No adenopathy noted.   Pulmonary/Chest: Normal effort and positive vesicular breath sounds. No respiratory distress. No wheezes, rales or ronchi noted.       Assessment & Plan:   Viral Sinusitis  Can use a Neti Pot which can be purchased from your local drug store. Flonase 2 sprays each nostril for 3 days and then as needed. eRx for Pred Taper x 6 day  RTC as needed or if symptoms persist. Webb Silversmith, NP

## 2017-07-27 ENCOUNTER — Encounter: Payer: Self-pay | Admitting: Internal Medicine

## 2017-08-16 DIAGNOSIS — G43719 Chronic migraine without aura, intractable, without status migrainosus: Secondary | ICD-10-CM | POA: Diagnosis not present

## 2017-08-17 DIAGNOSIS — Z1283 Encounter for screening for malignant neoplasm of skin: Secondary | ICD-10-CM | POA: Diagnosis not present

## 2017-08-17 DIAGNOSIS — D229 Melanocytic nevi, unspecified: Secondary | ICD-10-CM | POA: Diagnosis not present

## 2017-08-17 DIAGNOSIS — L719 Rosacea, unspecified: Secondary | ICD-10-CM | POA: Diagnosis not present

## 2017-08-17 DIAGNOSIS — D485 Neoplasm of uncertain behavior of skin: Secondary | ICD-10-CM | POA: Diagnosis not present

## 2017-10-17 ENCOUNTER — Ambulatory Visit: Payer: BLUE CROSS/BLUE SHIELD | Admitting: Family Medicine

## 2017-10-17 ENCOUNTER — Encounter: Payer: Self-pay | Admitting: Family Medicine

## 2017-10-17 VITALS — BP 102/70 | HR 87 | Temp 97.9°F | Wt 160.0 lb

## 2017-10-17 DIAGNOSIS — K529 Noninfective gastroenteritis and colitis, unspecified: Secondary | ICD-10-CM

## 2017-10-17 NOTE — Patient Instructions (Signed)
Viral Gastroenteritis, Adult  Viral gastroenteritis is also known as the stomach flu. This condition is caused by various viruses. These viruses can be passed from person to person very easily (are very contagious). This condition may affect your stomach, small intestine, and large intestine. It can cause sudden watery diarrhea, fever, and vomiting.  Diarrhea and vomiting can make you feel weak and cause you to become dehydrated. You may not be able to keep fluids down. Dehydration can make you tired and thirsty, cause you to have a dry mouth, and decrease how often you urinate. Older adults and people with other diseases or a weak immune system are at higher risk for dehydration.  It is important to replace the fluids that you lose from diarrhea and vomiting. If you become severely dehydrated, you may need to get fluids through an IV tube.  What are the causes?  Gastroenteritis is caused by various viruses, including rotavirus and norovirus. Norovirus is the most common cause in adults.  You can get sick by eating food, drinking water, or touching a surface contaminated with one of these viruses. You can also get sick from sharing utensils or other personal items with an infected person.  What increases the risk?  This condition is more likely to develop in people:  · Who have a weak defense system (immune system).  · Who live with one or more children who are younger than 2 years old.  · Who live in a nursing home.  · Who go on cruise ships.    What are the signs or symptoms?  Symptoms of this condition start suddenly 1-2 days after exposure to a virus. Symptoms may last a few days or as long as a week. The most common symptoms are watery diarrhea and vomiting. Other symptoms include:  · Fever.  · Headache.  · Fatigue.  · Pain in the abdomen.  · Chills.  · Weakness.  · Nausea.  · Muscle aches.  · Loss of appetite.    How is this diagnosed?  This condition is diagnosed with a medical history and physical exam. You  may also have a stool test to check for viruses or other infections.  How is this treated?  This condition typically goes away on its own. The focus of treatment is to restore lost fluids (rehydration). Your health care provider may recommend that you take an oral rehydration solution (ORS) to replace important salts and minerals (electrolytes) in your body. Severe cases of this condition may require giving fluids through an IV tube.  Treatment may also include medicine to help with your symptoms.  Follow these instructions at home:  Follow instructions from your health care provider about how to care for yourself at home.  Eating and drinking  Follow these recommendations as told by your health care provider:  · Take an ORS. This is a drink that is sold at pharmacies and retail stores.  · Drink clear fluids in small amounts as you are able. Clear fluids include water, ice chips, diluted fruit juice, and low-calorie sports drinks.  · Eat bland, easy-to-digest foods in small amounts as you are able. These foods include bananas, applesauce, rice, lean meats, toast, and crackers.  · Avoid fluids that contain a lot of sugar or caffeine, such as energy drinks, sports drinks, and soda.  · Avoid alcohol.  · Avoid spicy or fatty foods.    General instructions    · Drink enough fluid to keep your urine clear or   pale yellow.  · Wash your hands often. If soap and water are not available, use hand sanitizer.  · Make sure that all people in your household wash their hands well and often.  · Take over-the-counter and prescription medicines only as told by your health care provider.  · Rest at home while you recover.  · Watch your condition for any changes.  · Take a warm bath to relieve any burning or pain from frequent diarrhea episodes.  · Keep all follow-up visits as told by your health care provider. This is important.  Contact a health care provider if:  · You cannot keep fluids down.  · Your symptoms get worse.  · You have  new symptoms.  · You feel light-headed or dizzy.  · You have muscle cramps.  Get help right away if:  · You have chest pain.  · You feel extremely weak or you faint.  · You see blood in your vomit.  · Your vomit looks like coffee grounds.  · You have bloody or black stools or stools that look like tar.  · You have a severe headache, a stiff neck, or both.  · You have a rash.  · You have severe pain, cramping, or bloating in your abdomen.  · You have trouble breathing or you are breathing very quickly.  · Your heart is beating very quickly.  · Your skin feels cold and clammy.  · You feel confused.  · You have pain when you urinate.  · You have signs of dehydration, such as:  ? Dark urine, very little urine, or no urine.  ? Cracked lips.  ? Dry mouth.  ? Sunken eyes.  ? Sleepiness.  ? Weakness.  This information is not intended to replace advice given to you by your health care provider. Make sure you discuss any questions you have with your health care provider.  Document Released: 06/21/2005 Document Revised: 12/03/2015 Document Reviewed: 02/25/2015  Elsevier Interactive Patient Education © 2018 [employer]

## 2017-10-17 NOTE — Progress Notes (Signed)
Subjective:    Patient ID: Robin West, female    DOB: 10-04-1980, 37 y.o.   MRN: [id number]  HPI This is a 37 yo female who presents today with stomach problems. Three days ago, had abdominal bloating and then started to have diarrhea. It is not uncommon for her to have diarrhea, but not usually like this. Abdomen hurts all over and has cramps prior to diarrhea. Has had 2 episodes today. Got up a couple of times through the night. Yesterday ate some crackers and bread, drinking water, Gatorade. Two days prior to symptoms starting, she ate out, had some seafood and sushi, no one else has been sick. No antibiotics in last couple of months. Mild nausea. Has not been taking any medication for diarrhea, has been taking phenergan for nausea with relief. Has ondansetron at home, has not taken.   Past Medical History:  Diagnosis Date  . Allergy   . Chicken pox   . Frequent headaches   . IBS (irritable bowel syndrome)    Past Surgical History:  Procedure Laterality Date  . GANGLION CYST EXCISION Left 01/08/2016   Procedure: EXCISION DORSAL GANGLION LEFT WRIST;  Surgeon: Daryll Brod, MD;  Location: [facility];  Service: Orthopedics;  Laterality: Left;  . HEMORROIDECTOMY  2004  . TONSILLECTOMY  2010  . WISDOM TOOTH EXTRACTION     Family History  Problem Relation Age of Onset  . Arthritis Father   . Hyperlipidemia Father   . Hypertension Father   . Arthritis Maternal Grandmother   . Stroke Maternal Grandfather   . Cancer Paternal Grandmother        Ovarian   . Cancer Paternal Aunt        ovarian cancer   Social History   Tobacco Use  . Smoking status: Never Smoker  . Smokeless tobacco: Never Used  Substance Use Topics  . Alcohol use: Yes    Alcohol/week: 0.0 oz    Comment: rare  . Drug use: No    Review of Systems Per HPI    Objective:   Physical Exam  Constitutional: She is oriented to person, place, and time. She appears well-developed and well-nourished. No  distress.  HENT:  Head: Normocephalic and atraumatic.  Mouth/Throat: Oropharynx is clear and moist.  Eyes: Conjunctivae are normal.  Neck: Normal range of motion. Neck supple.  Cardiovascular: Normal rate, regular rhythm and normal heart sounds.  Pulmonary/Chest: Effort normal and breath sounds normal.  Abdominal: Soft. Bowel sounds are normal. She exhibits no distension and no mass. There is tenderness (mild generalized). There is no rebound and no guarding.  Neurological: She is alert and oriented to person, place, and time.  Skin: Skin is warm and dry. She is not diaphoretic.  Psychiatric: She has a normal mood and affect. Her behavior is normal. Judgment and thought content normal.  Vitals reviewed.     BP 102/70 (BP Location: Right Arm, Patient Position: Sitting, Cuff Size: Normal)   Pulse 87   Temp 97.9 F (36.6 C) (Oral)   Wt 160 lb (72.6 kg)   LMP 09/26/2017   SpO2 98%   BMI 26.02 kg/m  Wt Readings from Last 3 Encounters:  10/17/17 160 lb (72.6 kg)  07/14/17 161 lb (73 kg)  12/14/16 170 lb 8 oz (77.3 kg)       Assessment & Plan:  1. Gastroenteritis - likely viral - Provided written and verbal information regarding diagnosis and treatment. - RTC/ER precautions reviewed - discussed gradual rehydration,  slowly introducing solid foods - she has some ondansetron at home and can use instead of phenergan if needed to avoid sedation   Clarene Reamer, FNP-BC  [facility], [place]  10/23/2017 6:02 PM

## 2017-10-23 ENCOUNTER — Encounter: Payer: Self-pay | Admitting: Family Medicine

## 2017-11-11 ENCOUNTER — Encounter: Payer: Self-pay | Admitting: Internal Medicine

## 2017-11-11 MED ORDER — DICYCLOMINE HCL 10 MG PO CAPS: 10.0000 mg | ORAL_CAPSULE | Freq: Three times a day (TID) | ORAL | 2 refills | Status: DC

## 2017-12-20 ENCOUNTER — Encounter: Payer: BLUE CROSS/BLUE SHIELD | Admitting: Internal Medicine

## 2017-12-20 DIAGNOSIS — Z01419 Encounter for gynecological examination (general) (routine) without abnormal findings: Secondary | ICD-10-CM | POA: Diagnosis not present

## 2017-12-20 DIAGNOSIS — Z6825 Body mass index (BMI) 25.0-25.9, adult: Secondary | ICD-10-CM | POA: Diagnosis not present

## 2017-12-20 DIAGNOSIS — Z3041 Encounter for surveillance of contraceptive pills: Secondary | ICD-10-CM | POA: Diagnosis not present

## 2017-12-26 DIAGNOSIS — G43719 Chronic migraine without aura, intractable, without status migrainosus: Secondary | ICD-10-CM | POA: Diagnosis not present

## 2018-01-27 ENCOUNTER — Encounter: Payer: Self-pay | Admitting: Internal Medicine

## 2018-01-27 ENCOUNTER — Ambulatory Visit (HOSPITAL_COMMUNITY)
Admission: EM | Admit: 2018-01-27 | Discharge: 2018-01-27 | Disposition: A | Discharge status: Home / Self Care | Payer: BLUE CROSS/BLUE SHIELD | Attending: Emergency Medicine | Admitting: Emergency Medicine

## 2018-01-27 ENCOUNTER — Encounter (HOSPITAL_COMMUNITY): Payer: Self-pay | Admitting: Emergency Medicine

## 2018-01-27 ENCOUNTER — Other Ambulatory Visit: Payer: Self-pay

## 2018-01-27 ENCOUNTER — Telehealth (HOSPITAL_COMMUNITY): Payer: Self-pay

## 2018-01-27 DIAGNOSIS — R1030 Lower abdominal pain, unspecified: Secondary | ICD-10-CM

## 2018-01-27 DIAGNOSIS — R197 Diarrhea, unspecified: Secondary | ICD-10-CM | POA: Diagnosis not present

## 2018-01-27 MED ORDER — IBUPROFEN 600 MG PO TABS: 600.0000 mg | ORAL_TABLET | Freq: Four times a day (QID) | ORAL | 0 refills | Status: DC | PRN

## 2018-01-27 MED ORDER — DIPHENOXYLATE-ATROPINE 2.5-0.025 MG PO TABS: 1.0000 | ORAL_TABLET | Freq: Four times a day (QID) | ORAL | 0 refills | Status: DC | PRN

## 2018-01-27 NOTE — ED Notes (Signed)
Pt will bring stool sample back

## 2018-01-27 NOTE — ED Triage Notes (Signed)
Patient states she has had diarrhea for 2 weeks and developed rectal and abdominal pain.

## 2018-01-27 NOTE — Discharge Instructions (Addendum)
We will call you if your GI pathogen panel comes back positive for infection requiring antibiotics.  You may also call here and get your results in several days.  Try the Lomotil in addition to the Bentyl.  600 mg ibuprofen with 1 g of Tylenol together 3 or 4 times a day as needed for abdominal pain.  Follow-up with [facility] gastroenterology as soon as you can, especially if your GI pathogen panel comes back negative for an infection.  Go immediately to the ER for fevers above 100.4, black or tarry stools, blood in your stool, or abdominal pain that is not controlled with the Tylenol/ /ibuprofen, or for any other concerns.

## 2018-01-27 NOTE — ED Provider Notes (Addendum)
HPI  SUBJECTIVE:  Robin West is a 37 y.o. female who presents with 2 weeks of daily nonbloody, watery diarrhea.  Patient reports having 15 episodes today.  Reports feeling fatigued.  She states that she has some formed stools.  She denies diarrhea alternating with constipation.  She reports waxing and waning hour-long intermittent low abdominal pain that she states is different than her usual IBS pain.  Had an episode of this last weekend and this episode started today.  She states it was radiating into her legs earlier today.  She reports rectal pain with the urge to defecate last night and today, but this has since resolved.  States that she was unable to defecate while having the rectal pain.  She denies urinary complaints, vaginal odor, discharge, bleeding, dyspareunia.  She is in a monogamous relationship with her husband, who is asymptomatic, STDs are not a concern today.  She has tried Bentyl for the diarrhea.  No alleviating factors.  Symptoms are worse when she eats or drinks anything.  States that she has diarrhea immediately after eating or drinking.  She has tried Tylenol, Bentyl and probiotic for her abdominal pain.  She states that the Tylenol and standing up seems to help with abdominal pain.  Abdominal pain is worse with sitting or lying still.  Her abdominal pain is not associated with p.o. intake, movement.  She states that she has not had this much diarrhea with previous IBS flares.  She denies new rectal mass, rectal bleeding, painful bowel movements.  No antipyretics in the past 4 to 6 hours.  She has a past medical history of IBS for which she takes Bentyl, internal and external hemorrhoids, UTI.  No history of pancreatitis, gallbladder disease, pyelonephritis, STDs, trichomonas, BV.  She has had yeast infections before.  LMP: Last week.  She denies the possibility of being pregnant.  TGG:YIRSW, Coralie Keens, NP     Past Medical History:  Diagnosis Date  . Allergy   . Chicken pox    . Frequent headaches   . IBS (irritable bowel syndrome)     Past Surgical History:  Procedure Laterality Date  . GANGLION CYST EXCISION Left 01/08/2016   Procedure: EXCISION DORSAL GANGLION LEFT WRIST;  Surgeon: Daryll Brod, MD;  Location: [place] [facility];  Service: Orthopedics;  Laterality: Left;  . HEMORROIDECTOMY  2004  . TONSILLECTOMY  2010  . WISDOM TOOTH EXTRACTION      Family History  Problem Relation Age of Onset  . Arthritis Father   . Hyperlipidemia Father   . Hypertension Father   . Arthritis Maternal Grandmother   . Stroke Maternal Grandfather   . Cancer Paternal Grandmother        Ovarian   . Cancer Paternal Aunt        ovarian cancer    Social History   Tobacco Use  . Smoking status: Never Smoker  . Smokeless tobacco: Never Used  Substance Use Topics  . Alcohol use: Yes    Alcohol/week: 0.0 oz    Comment: rare  . Drug use: No    No current facility-administered medications for this encounter.   Current Outpatient Medications:  .  dicyclomine (BENTYL) 10 MG capsule, Take 1 capsule (10 mg total) by mouth 3 (three) times daily before meals., Disp: 90 capsule, Rfl: 2 .  JUNEL FE 1/20 1-20 MG-MCG tablet, Take 1 tablet by mouth daily., Disp: , Rfl: 3 .  lactobacillus acidophilus (BACID) TABS tablet, Take 2 tablets by mouth  3 (three) times daily., Disp: , Rfl:  .  Multiple Vitamin (MULTI-VITAMINS) TABS, Take by mouth., Disp: , Rfl:  .  diphenoxylate-atropine (LOMOTIL) 2.5-0.025 MG tablet, Take 1 tablet by mouth 4 (four) times daily as needed for diarrhea or loose stools., Disp: 30 tablet, Rfl: 0 .  ibuprofen (ADVIL,MOTRIN) 600 MG tablet, Take 1 tablet (600 mg total) by mouth every 6 (six) hours as needed., Disp: 30 tablet, Rfl: 0 .  Nutritional Supplements (JUICE PLUS FIBRE PO), Take 6 capsules by mouth daily., Disp: , Rfl:  .  promethazine (PHENERGAN) 25 MG tablet, TAKE 1 TABLET (25 MG TOTAL) BY MOUTH EVERY 6 (SIX) HOURS AS NEEDED FOR NAUSEA., Disp: ,  Rfl:  .  SUMAtriptan (IMITREX) 100 MG tablet, Take 1 tablet by mouth. For migraine may repeat in 2hrs if not better Max 2/24hr, Disp: , Rfl: 2 .  SUMAtriptan 6 MG/0.5ML SOAJ, INJECT 0.5MLS SUBCUTANEOUSLY ONCE AS NEEDED FOR MIGRAINE. MAY REPEAT IN 1 HOUR IF NEEDED, Disp: , Rfl: 3  No Known Allergies   ROS  As noted in HPI.   Physical Exam  BP 137/87 (BP Location: Left Arm)   Pulse 94   Temp 98.4 F (36.9 C) (Oral)   Resp 16   Ht 5\' 7"  (1.702 m)   Wt 160 lb (72.6 kg)   LMP 01/18/2018 (Approximate)   SpO2 100%   BMI 25.06 kg/m   Constitutional: Well developed, well nourished, no acute distress Eyes:  EOMI, conjunctiva normal bilaterally HENT: Normocephalic, atraumatic,mucus membranes moist Respiratory: Normal inspiratory effort Cardiovascular: Normal rate GI: Hypoactive bowel sounds.  Soft, mild periumbilical tenderness to deep palpation, no guarding, rebound.  No distention.  Negative tap table test.  No suprapubic tenderness.  Negative Murphy, negative McBurney. Rectal: Positive external skin tags.  Normal rectal tone.  No stool on glove.  Hemoccult negative. skin: No rash, skin intact Musculoskeletal: no deformities Neurologic: Alert & oriented x 3, no focal neuro deficits Psychiatric: Speech and behavior appropriate   ED Course   Medications - No data to display  Orders Placed This Encounter  Procedures  . Gastrointestinal Panel by PCR , Stool    Standing Status:   Standing    Number of Occurrences:   1  . Enteric precautions (UV disinfection) C difficile, Norovirus    Standing Status:   Standing    Number of Occurrences:   1    No results found for this or any previous visit (from the past 24 hour(s)). No results found.  ED Clinical Impression  Diarrhea, unspecified type  Lower abdominal pain   ED Assessment/Plan  Abdomen benign.  No evidence of a surgical abdomen,  Pancreatitis, gallbladder disease.  Do not think that she needs advanced abdominal  imaging today.  Doubt UTI, PID, other GYN cause of her abdominal pain.  Suspect that she is having an IBS flare, however since she has had straight diarrhea for 2 weeks, we will also check a GI pathogen panel.  Feel that she is low risk for C. difficile, and she has had some formed stools, so C. difficile testing was not performed. She has had no fevers, afebrile here, has not taken antipyretic in the previous 4 to 6 hours, to feel that it is safe to send her home with Lomotil in addition to the Bentyl.  Sent home with ibuprofen 600 mg to take with 1 g of Tylenol together 3 or 4 times a day as needed for abdominal pain.  She is to go  immediately to the ER for fevers above 100.4, blood in her stool, black or tarry stools, and for abdominal pain is not controlled with Tylenol/ibuprofen, or for other concerns.  Will refer her to GI.   [facility] gastroenterology on call.  Discussed labs, MDM, treatment plan, and plan for follow-up with patient. Discussed sn/sx that should prompt return to the ED. patient agrees with plan.   Meds ordered this encounter  Medications  . diphenoxylate-atropine (LOMOTIL) 2.5-0.025 MG tablet    Sig: Take 1 tablet by mouth 4 (four) times daily as needed for diarrhea or loose stools.    Dispense:  30 tablet    Refill:  0  . ibuprofen (ADVIL,MOTRIN) 600 MG tablet    Sig: Take 1 tablet (600 mg total) by mouth every 6 (six) hours as needed.    Dispense:  30 tablet    Refill:  0    *This clinic note was created using [employer]. Therefore, there may be occasional mistakes despite careful proofreading.   ?   Melynda Ripple, MD 01/27/18 1336    Melynda Ripple, MD 01/27/18 1340

## 2018-01-30 ENCOUNTER — Other Ambulatory Visit: Payer: BLUE CROSS/BLUE SHIELD

## 2018-01-30 DIAGNOSIS — R197 Diarrhea, unspecified: Secondary | ICD-10-CM | POA: Diagnosis not present

## 2018-02-02 ENCOUNTER — Telehealth: Payer: Self-pay | Admitting: Internal Medicine

## 2018-02-02 ENCOUNTER — Encounter: Payer: Self-pay | Admitting: Internal Medicine

## 2018-02-02 NOTE — Telephone Encounter (Signed)
It can take 72 hours or more for results, I will call when we have the results

## 2018-02-02 NOTE — Telephone Encounter (Unsigned)
Copied from JAARS [id number]. Topic: General - Other >> Feb 02, 2018 10:35 AM Judyann Munson wrote: Reason for CRM: patient is requesting a call in regards tolab result from 01-30-18. Please advise

## 2018-02-03 LAB — GASTROINTESTINAL PATHOGEN PANEL PCR
C. DIFFICILE TOX A/B, PCR: NOT DETECTED
CAMPYLOBACTER, PCR: NOT DETECTED
CRYPTOSPORIDIUM, PCR: NOT DETECTED
E COLI (STEC) STX1/STX2, PCR: NOT DETECTED
E coli (ETEC) LT/ST PCR: NOT DETECTED
E coli 0157, PCR: NOT DETECTED
GIARDIA LAMBLIA, PCR: NOT DETECTED
NOROVIRUS, PCR: NOT DETECTED
ROTAVIRUS, PCR: NOT DETECTED
Salmonella, PCR: NOT DETECTED
Shigella, PCR: NOT DETECTED

## 2018-02-08 ENCOUNTER — Other Ambulatory Visit: Payer: Self-pay | Admitting: Internal Medicine

## 2018-02-23 DIAGNOSIS — G43719 Chronic migraine without aura, intractable, without status migrainosus: Secondary | ICD-10-CM | POA: Diagnosis not present

## 2018-02-28 ENCOUNTER — Encounter

## 2018-02-28 ENCOUNTER — Encounter: Payer: Self-pay | Admitting: Internal Medicine

## 2018-02-28 ENCOUNTER — Ambulatory Visit (INDEPENDENT_AMBULATORY_CARE_PROVIDER_SITE_OTHER): Payer: BLUE CROSS/BLUE SHIELD | Admitting: Internal Medicine

## 2018-02-28 VITALS — BP 112/70 | HR 86 | Temp 98.1°F | Ht 66.0 in | Wt 163.0 lb

## 2018-02-28 DIAGNOSIS — Z0000 Encounter for general adult medical examination without abnormal findings: Secondary | ICD-10-CM

## 2018-02-28 DIAGNOSIS — R51 Headache: Secondary | ICD-10-CM

## 2018-02-28 DIAGNOSIS — R519 Headache, unspecified: Secondary | ICD-10-CM

## 2018-02-28 DIAGNOSIS — K582 Mixed irritable bowel syndrome: Secondary | ICD-10-CM

## 2018-02-28 DIAGNOSIS — J302 Other seasonal allergic rhinitis: Secondary | ICD-10-CM

## 2018-02-28 LAB — CBC
HCT: 36.7 % (ref 36.0–46.0)
HEMOGLOBIN: 12.4 g/dL (ref 12.0–15.0)
MCHC: 33.8 g/dL (ref 30.0–36.0)
MCV: 90.7 fl (ref 78.0–100.0)
Platelets: 297 10*3/uL (ref 150.0–400.0)
RBC: 4.04 Mil/uL (ref 3.87–5.11)
RDW: 12.5 % (ref 11.5–15.5)
WBC: 7.9 10*3/uL (ref 4.0–10.5)

## 2018-02-28 LAB — COMPREHENSIVE METABOLIC PANEL
ALT: 13 U/L (ref 0–35)
AST: 10 U/L (ref 0–37)
Albumin: 4.2 g/dL (ref 3.5–5.2)
Alkaline Phosphatase: 39 U/L (ref 39–117)
BILIRUBIN TOTAL: 0.5 mg/dL (ref 0.2–1.2)
BUN: 23 mg/dL (ref 6–23)
CO2: 28 meq/L (ref 19–32)
Calcium: 9.2 mg/dL (ref 8.4–10.5)
Chloride: 103 mEq/L (ref 96–112)
Creatinine, Ser: 0.66 mg/dL (ref 0.40–1.20)
GFR: 105.82 mL/min (ref >60.00)
GLUCOSE: 96 mg/dL (ref 70–99)
Potassium: 4 mEq/L (ref 3.5–5.1)
SODIUM: 137 meq/L (ref 135–145)
Total Protein: 7 g/dL (ref 6.0–8.3)

## 2018-02-28 LAB — LIPID PANEL
CHOL/HDL RATIO: 4
Cholesterol: 175 mg/dL (ref 0–200)
HDL: 47.1 mg/dL (ref >39.00)
LDL Cholesterol: 110 mg/dL — ABNORMAL HIGH (ref 0–99)
NONHDL: 128.38
Triglycerides: 93 mg/dL (ref 0.0–149.0)
VLDL: 18.6 mg/dL (ref 0.0–40.0)

## 2018-02-28 NOTE — Progress Notes (Signed)
Subjective:    Patient ID: Robin West, female    DOB: 01-14-1981, 37 y.o.   MRN: [id number]  HPI  Pt presents to the clinic today for her annual exam. She is also due to follow up chronic conditions.  Seasonal Allergies: Worse in the spring. She takes an OTC antihistamine as needed with good relief.  Frequent Headaches: She is getting SPG blocks every 4 months. She takes Imitrex, Zofran/Phenergan as needed with good relief. She follows with Dr. Manuella Ghazi.  IBS: She has alternating constipation and diarrhea. She takes Bentyl BID with good relief. She also stopped taking Zoloft and is using CBD oil OTC.  Flu: 09/2015 Tetanus: 11/2012 Pap Smear: 10/2016, [place] [facility]ist: biannually  Diet: She is consuming a Keto diet. She does eat meat. She consumes fruits and veggies daily. She tries to avoid fried foods. She drinks mostly water, coffee. Exercise: walks daily for at least 30 minutes.  Review of Systems  Past Medical History:  Diagnosis Date  . Allergy   . Chicken pox   . Frequent headaches   . IBS (irritable bowel syndrome)     Current Outpatient Medications  Medication Sig Dispense Refill  . dicyclomine (BENTYL) 10 MG capsule Take 10 mg by mouth 3 (three) times daily.     Lenda Kelp FE 1/20 1-20 MG-MCG tablet Take 1 tablet by mouth daily.  3  . Multiple Vitamin (MULTI-VITAMINS) TABS Take by mouth.    . Nutritional Supplements (JUICE PLUS FIBRE PO) Take 6 capsules by mouth daily.    . promethazine (PHENERGAN) 25 MG tablet TAKE 1 TABLET (25 MG TOTAL) BY MOUTH EVERY 6 (SIX) HOURS AS NEEDED FOR NAUSEA.    . SUMAtriptan (IMITREX) 100 MG tablet Take 1 tablet by mouth. For migraine may repeat in 2hrs if not better Max 2/24hr  2  . SUMAtriptan 6 MG/0.5ML SOAJ INJECT 0.5MLS SUBCUTANEOUSLY ONCE AS NEEDED FOR MIGRAINE. MAY REPEAT IN 1 HOUR IF NEEDED  3   No current facility-administered medications for this visit.     No Known Allergies  Family History  Problem  Relation Age of Onset  . Arthritis Father   . Hyperlipidemia Father   . Hypertension Father   . Arthritis Maternal Grandmother   . Stroke Maternal Grandfather   . Cancer Paternal Grandmother        Ovarian   . Cancer Paternal Aunt        ovarian cancer    Social History   Socioeconomic History  . Marital status: Married    Spouse name: Not on file  . Number of children: Not on file  . Years of education: Not on file  . Highest education level: Not on file  Occupational History  . Not on file  Social Needs  . Financial resource strain: Not on file  . Food insecurity:    Worry: Not on file    Inability: Not on file  . Transportation needs:    Medical: Not on file    Non-medical: Not on file  Tobacco Use  . Smoking status: Never Smoker  . Smokeless tobacco: Never Used  Substance and Sexual Activity  . Alcohol use: Yes    Alcohol/week: 0.0 standard drinks    Comment: rare  . Drug use: No  . Sexual activity: Yes    Birth control/protection: Pill  Lifestyle  . Physical activity:    Days per week: Not on file    Minutes per session: Not on file  .  Stress: Not on file  Relationships  . Social connections:    Talks on phone: Not on file    Gets together: Not on file    Attends religious service: Not on file    Active member of club or organization: Not on file    Attends meetings of clubs or organizations: Not on file    Relationship status: Not on file  . Intimate partner violence:    Fear of current or ex partner: Not on file    Emotionally abused: Not on file    Physically abused: Not on file    Forced sexual activity: Not on file  Other Topics Concern  . Not on file  Social History Narrative  . Not on file     Constitutional: Pt reports intermittent headaches. Denies fever, malaise, fatigue, or abrupt weight changes.  HEENT: Denies eye pain, eye redness, ear pain, ringing in the ears, wax buildup, runny nose, nasal congestion, bloody nose, or sore  throat. Respiratory: Denies difficulty breathing, shortness of breath, cough or sputum production.   Cardiovascular: Denies chest pain, chest tightness, palpitations or swelling in the hands or feet.  Gastrointestinal: Pt reports intermittent abdominal pain, alternating constipation and diarrhea. Denies bloating, or blood in the stool.  GU: Denies urgency, frequency, pain with urination, burning sensation, blood in urine, odor or discharge. Musculoskeletal: Denies decrease in range of motion, difficulty with gait, muscle pain or joint pain and swelling.  Skin: Denies redness, rashes, lesions or ulcercations.  Neurological: Denies dizziness, difficulty with memory, difficulty with speech or problems with balance and coordination.  Psych: Denies anxiety, depression, SI/HI.  No other specific complaints in a complete review of systems (except as listed in HPI above).     Objective:   Physical Exam  BP 112/70   Pulse 86   Temp 98.1 F (36.7 C) (Oral)   Ht 5\' 6"  (1.676 m)   Wt 163 lb (73.9 kg)   LMP 02/20/2018   SpO2 98%   BMI 26.31 kg/m  Wt Readings from Last 3 Encounters:  02/28/18 163 lb (73.9 kg)  01/27/18 160 lb (72.6 kg)  10/17/17 160 lb (72.6 kg)    General: Appears her stated age, well developed, well nourished in NAD. Skin: Warm, dry and intact.  HEENT: Head: normal shape and size; Eyes: sclera white, no icterus, conjunctiva pink, PERRLA and EOMs intact; Ears: Tm's gray and intact, normal light reflex; Throat/Mouth: Teeth present, mucosa pink and moist, no exudate, lesions or ulcerations noted.  Neck:  Neck supple, trachea midline. No masses, lumps or thyromegaly present.  Cardiovascular: Normal rate and rhythm. S1,S2 noted.  No murmur, rubs or gallops noted. No JVD or BLE edema.  Pulmonary/Chest: Normal effort and positive vesicular breath sounds. No respiratory distress. No wheezes, rales or ronchi noted.  Abdomen: Soft and generally tender. Normal bowel sounds. No  distention or masses noted. Liver, spleen and kidneys non palpable. Musculoskeletal: Strength 5/5 BUE/BLE. No difficulty with gait.  Neurological: Alert and oriented. Cranial nerves II-XII grossly intact. Coordination normal.  Psychiatric: Mood and affect normal. Behavior is normal. Judgment and thought content normal.     BMET    Component Value Date/Time   NA 139 12/14/2016 1144   K 4.3 12/14/2016 1144   CL 105 12/14/2016 1144   CO2 27 12/14/2016 1144   GLUCOSE 91 12/14/2016 1144   BUN 24 (H) 12/14/2016 1144   CREATININE 0.55 12/14/2016 1144   CALCIUM 9.5 12/14/2016 1144   GFRNONAA >60 02/29/2016  1902   GFRAA >60 02/29/2016 1902    Lipid Panel     Component Value Date/Time   CHOL 197 12/14/2016 1144   TRIG 89.0 12/14/2016 1144   HDL 39.30 12/14/2016 1144   CHOLHDL 5 12/14/2016 1144   VLDL 17.8 12/14/2016 1144   LDLCALC 140 (H) 12/14/2016 1144    CBC    Component Value Date/Time   WBC 8.4 12/14/2016 1144   RBC 4.15 12/14/2016 1144   HGB 12.5 12/14/2016 1144   HCT 37.1 12/14/2016 1144   PLT 319.0 12/14/2016 1144   MCV 89.4 12/14/2016 1144   MCH 31.0 02/29/2016 1902   MCHC 33.8 12/14/2016 1144   RDW 13.1 12/14/2016 1144    Hgb A1C Lab Results  Component Value Date   HGBA1C 5.6 12/14/2016            Assessment & Plan:   Preventative Health Maintenance:  Encouraged her to get a flu shot in the fall Tetanus UTD Pap smear UTD Encouraged her to consume a balanced diet and exercise regimen Advised her to see an eye doctor and dentist annually Will check CBC, CMET, Lipid profile today  RTC in 1 year, sooner if needed Webb Silversmith, NP

## 2018-02-28 NOTE — Assessment & Plan Note (Signed)
Continue OTC antihistamine

## 2018-02-28 NOTE — Assessment & Plan Note (Signed)
Continue Keto diet, Bentyl and CBD oil Will monitor

## 2018-02-28 NOTE — Patient Instructions (Signed)

## 2018-02-28 NOTE — Assessment & Plan Note (Signed)
She will continue SBG blocks, Imitrex, Phenergan/Zofran as needed She will continue to follow with neurology

## 2018-05-02 DIAGNOSIS — G43719 Chronic migraine without aura, intractable, without status migrainosus: Secondary | ICD-10-CM | POA: Diagnosis not present

## 2018-05-09 ENCOUNTER — Encounter: Payer: Self-pay | Admitting: Internal Medicine

## 2018-05-25 DIAGNOSIS — E538 Deficiency of other specified B group vitamins: Secondary | ICD-10-CM | POA: Diagnosis not present

## 2018-05-25 DIAGNOSIS — E559 Vitamin D deficiency, unspecified: Secondary | ICD-10-CM | POA: Diagnosis not present

## 2018-05-25 DIAGNOSIS — G43719 Chronic migraine without aura, intractable, without status migrainosus: Secondary | ICD-10-CM | POA: Diagnosis not present

## 2018-05-29 ENCOUNTER — Ambulatory Visit: Payer: BLUE CROSS/BLUE SHIELD | Admitting: Internal Medicine

## 2018-05-29 ENCOUNTER — Encounter: Payer: Self-pay | Admitting: Internal Medicine

## 2018-05-29 VITALS — BP 124/78 | HR 85 | Temp 98.6°F | Wt 171.0 lb

## 2018-05-29 DIAGNOSIS — R51 Headache: Secondary | ICD-10-CM | POA: Diagnosis not present

## 2018-05-29 DIAGNOSIS — E538 Deficiency of other specified B group vitamins: Secondary | ICD-10-CM

## 2018-05-29 DIAGNOSIS — R350 Frequency of micturition: Secondary | ICD-10-CM

## 2018-05-29 DIAGNOSIS — R682 Dry mouth, unspecified: Secondary | ICD-10-CM | POA: Diagnosis not present

## 2018-05-29 DIAGNOSIS — R450 Nervousness: Secondary | ICD-10-CM

## 2018-05-29 DIAGNOSIS — Z23 Encounter for immunization: Secondary | ICD-10-CM

## 2018-05-29 DIAGNOSIS — R519 Headache, unspecified: Secondary | ICD-10-CM

## 2018-05-29 LAB — COMPREHENSIVE METABOLIC PANEL
ALT: 15 U/L (ref 0–35)
AST: 11 U/L (ref 0–37)
Albumin: 4.2 g/dL (ref 3.5–5.2)
Alkaline Phosphatase: 35 U/L — ABNORMAL LOW (ref 39–117)
BUN: 20 mg/dL (ref 6–23)
CHLORIDE: 103 meq/L (ref 96–112)
CO2: 25 meq/L (ref 19–32)
CREATININE: 0.71 mg/dL (ref 0.40–1.20)
Calcium: 8.9 mg/dL (ref 8.4–10.5)
GFR: 97.14 mL/min (ref >60.00)
Glucose, Bld: 101 mg/dL — ABNORMAL HIGH (ref 70–99)
Potassium: 3.6 mEq/L (ref 3.5–5.1)
SODIUM: 136 meq/L (ref 135–145)
Total Bilirubin: 0.2 mg/dL (ref 0.2–1.2)
Total Protein: 7 g/dL (ref 6.0–8.3)

## 2018-05-29 LAB — CBC
HCT: 35.8 % — ABNORMAL LOW (ref 36.0–46.0)
Hemoglobin: 12.2 g/dL (ref 12.0–15.0)
MCHC: 34 g/dL (ref 30.0–36.0)
MCV: 90.7 fl (ref 78.0–100.0)
Platelets: 317 10*3/uL (ref 150.0–400.0)
RBC: 3.95 Mil/uL (ref 3.87–5.11)
RDW: 13 % (ref 11.5–15.5)
WBC: 7.9 10*3/uL (ref 4.0–10.5)

## 2018-05-29 LAB — POCT GLYCOSYLATED HEMOGLOBIN (HGB A1C): Hemoglobin A1C: 5.5 % (ref 4.0–5.6)

## 2018-05-29 LAB — POC URINALSYSI DIPSTICK (AUTOMATED)
Bilirubin, UA: NEGATIVE
Glucose, UA: NEGATIVE
Ketones, UA: NEGATIVE
Leukocytes, UA: NEGATIVE
Nitrite, UA: NEGATIVE
PROTEIN UA: NEGATIVE
UROBILINOGEN UA: 0.2 U/dL
pH, UA: 6 (ref 5.0–8.0)

## 2018-05-29 NOTE — Progress Notes (Signed)
HPI  Pt presents to the clinic today with c/o urinary frequency and nocturia. She reports this started 2 weeks ago. She denies urgency, dysuria, abdominal or pelvic pain, nausea, vomiting or low back pain. She does report feeling shaky since this morning. She denies vaginal complaints. Last pap was 01/2018. She drinks some water throughout the day, 34 ounces daily. She drinks 1 cup coffee in it. She drinks 1 Coke Zero daily. Her LMP was about 3 weeks ago. She has not tried anything OTC for her symptoms.  She also reports frequent headaches. She noticed this over the last 3 weeks. They headaches are located in her forehead or right temple. She describes the pain as throbbing. She denies dizziness, visual changes, sensitivity to light or sound, nausea or vomiting. She denies neck pain at this time. She takes Imitrex as needed with some relief. She called her neurologist to discuss, they had her do some labs which are not back yet.  She also c/o dry mouth. She noticed this 2-3 weeks ago. She denies runny nose, nasal congestion or difficulty swallowing. She has not noticed any coating on her tongue. She has not tried anything OTC for these symptoms.   Review of Systems  Past Medical History:  Diagnosis Date  . Allergy   . Chicken pox   . Frequent headaches   . IBS (irritable bowel syndrome)     Family History  Problem Relation Age of Onset  . Arthritis Father   . Hyperlipidemia Father   . Hypertension Father   . Arthritis Maternal Grandmother   . Stroke Maternal Grandfather   . Cancer Paternal Grandmother        Ovarian   . Cancer Paternal Aunt        ovarian cancer    Social History   Socioeconomic History  . Marital status: Married    Spouse name: Not on file  . Number of children: Not on file  . Years of education: Not on file  . Highest education level: Not on file  Occupational History  . Not on file  Social Needs  . Financial resource strain: Not on file  . Food  insecurity:    Worry: Not on file    Inability: Not on file  . Transportation needs:    Medical: Not on file    Non-medical: Not on file  Tobacco Use  . Smoking status: Never Smoker  . Smokeless tobacco: Never Used  Substance and Sexual Activity  . Alcohol use: Yes    Alcohol/week: 0.0 standard drinks    Comment: rare  . Drug use: No  . Sexual activity: Yes    Birth control/protection: Pill  Lifestyle  . Physical activity:    Days per week: Not on file    Minutes per session: Not on file  . Stress: Not on file  Relationships  . Social connections:    Talks on phone: Not on file    Gets together: Not on file    Attends religious service: Not on file    Active member of club or organization: Not on file    Attends meetings of clubs or organizations: Not on file    Relationship status: Not on file  . Intimate partner violence:    Fear of current or ex partner: Not on file    Emotionally abused: Not on file    Physically abused: Not on file    Forced sexual activity: Not on file  Other Topics Concern  .  Not on file  Social History Narrative  . Not on file    No Known Allergies   Constitutional: Pt reports headaches. Denies fever, malaise, fatigue, or abrupt weight changes.   HEENT: Pt reports dry mouth. Denies eye pain, facial pain or pressure, nasal congestion, ear pain, runny nose, nasal congestion, ear pain or sore throat. GU: Pt reports frequency. Denies urgency, dysuria burning sensation, blood in urine, odor or discharge. Skin: Denies redness, rashes, lesions or ulcercations.  Neuro: Pt denies dizziness, difficulty with speech or memory, problems with balance or coordination.   No other specific complaints in a complete review of systems (except as listed in HPI above).    Objective:   Physical Exam  BP 124/78   Pulse 85   Temp 98.6 F (37 C) (Oral)   Wt 171 lb (77.6 kg)   LMP 05/08/2018   SpO2 98%   BMI 27.60 kg/m   Wt Readings from Last 3  Encounters:  02/28/18 163 lb (73.9 kg)  01/27/18 160 lb (72.6 kg)  10/17/17 160 lb (72.6 kg)    General: Appears her stated age, well developed, well nourished in NAD. Cardiovascular: Normal rate and rhythm. S1,S2 noted.   Pulmonary/Chest: Normal effort and positive vesicular breath sounds. No respiratory distress. No wheezes, rales or ronchi noted.  Abdomen: Soft, nontender. Normal bowel sounds. No distention or masses noted.  No CVA tenderness. Neuro: Coordination normal.       Assessment & Plan:   Urinary Frequency, Jitteriness:  Urinalysis: trace blood Will send urine culture Avoid fluids after 7 pm at night Avoid caffeine POCT A1C  Frequent Headaches:  Awaiting results of Vit D, TSH, Vit B12 Continue Imitrex for now Will check CBC, CMET Continue SPG blocks per neurology  Dry Mouth:  Will check POCT A1C today Encouraged fluids Can try Biotine  RTC as needed or if symptoms persist, will follow up after labs Webb Silversmith, NP

## 2018-05-29 NOTE — Patient Instructions (Signed)

## 2018-05-30 LAB — URINE CULTURE
MICRO NUMBER: 91418492
SPECIMEN QUALITY: ADEQUATE

## 2018-05-31 ENCOUNTER — Ambulatory Visit (INDEPENDENT_AMBULATORY_CARE_PROVIDER_SITE_OTHER): Payer: BLUE CROSS/BLUE SHIELD

## 2018-05-31 DIAGNOSIS — E538 Deficiency of other specified B group vitamins: Secondary | ICD-10-CM | POA: Diagnosis not present

## 2018-05-31 MED ORDER — CYANOCOBALAMIN 1000 MCG/ML IJ SOLN
1000.0000 ug | Freq: Once | INTRAMUSCULAR | Status: AC
  Administered 2018-05-31: 1000 ug via INTRAMUSCULAR

## 2018-05-31 NOTE — Progress Notes (Signed)
Per orders of NP Baity, injection of B12 given by Lindell Noe. Patient tolerated injection well to right deltoid.

## 2018-06-13 ENCOUNTER — Ambulatory Visit: Payer: Self-pay

## 2018-07-12 ENCOUNTER — Ambulatory Visit (INDEPENDENT_AMBULATORY_CARE_PROVIDER_SITE_OTHER): Payer: BLUE CROSS/BLUE SHIELD | Admitting: *Deleted

## 2018-07-12 DIAGNOSIS — E538 Deficiency of other specified B group vitamins: Secondary | ICD-10-CM | POA: Diagnosis not present

## 2018-07-12 MED ORDER — CYANOCOBALAMIN 1000 MCG/ML IJ SOLN
1000.0000 ug | Freq: Once | INTRAMUSCULAR | Status: AC
  Administered 2018-07-12: 1000 ug via INTRAMUSCULAR

## 2018-07-12 NOTE — Progress Notes (Signed)
Per orders of Dr. Tower, injection of b12 given by Jadriel Saxer H. Patient tolerated injection well.  

## 2018-07-15 ENCOUNTER — Other Ambulatory Visit: Payer: Self-pay | Admitting: Internal Medicine

## 2018-07-18 NOTE — Telephone Encounter (Signed)
Last filled 02/08/2018 #[id number]...Marland Kitchen please advise

## 2018-08-16 ENCOUNTER — Ambulatory Visit (INDEPENDENT_AMBULATORY_CARE_PROVIDER_SITE_OTHER): Payer: BLUE CROSS/BLUE SHIELD | Admitting: *Deleted

## 2018-08-16 DIAGNOSIS — E538 Deficiency of other specified B group vitamins: Secondary | ICD-10-CM | POA: Diagnosis not present

## 2018-08-16 MED ORDER — CYANOCOBALAMIN 1000 MCG/ML IJ SOLN
1000.0000 ug | Freq: Once | INTRAMUSCULAR | Status: AC
  Administered 2018-08-16: 1000 ug via INTRAMUSCULAR

## 2018-08-16 NOTE — Progress Notes (Signed)
Per orders of Dr. Silvio Pate, injection of b12 given by Modena Nunnery. Patient tolerated injection well.

## 2018-09-11 ENCOUNTER — Encounter: Payer: Self-pay | Admitting: Internal Medicine

## 2018-09-11 ENCOUNTER — Ambulatory Visit: Payer: BLUE CROSS/BLUE SHIELD | Admitting: Internal Medicine

## 2018-09-11 VITALS — BP 106/64 | HR 91 | Temp 98.0°F | Ht 66.0 in | Wt 169.0 lb

## 2018-09-11 DIAGNOSIS — B9789 Other viral agents as the cause of diseases classified elsewhere: Secondary | ICD-10-CM | POA: Diagnosis not present

## 2018-09-11 DIAGNOSIS — J329 Chronic sinusitis, unspecified: Secondary | ICD-10-CM | POA: Diagnosis not present

## 2018-09-11 DIAGNOSIS — K649 Unspecified hemorrhoids: Secondary | ICD-10-CM

## 2018-09-11 MED ORDER — PREDNISONE 10 MG PO TABS: ORAL_TABLET | ORAL | 0 refills | Status: DC

## 2018-09-11 MED ORDER — HYDROCORTISONE ACETATE 25 MG RE SUPP: 25.0000 mg | Freq: Two times a day (BID) | RECTAL | 0 refills | Status: DC

## 2018-09-11 MED ORDER — AZITHROMYCIN 250 MG PO TABS: ORAL_TABLET | ORAL | 0 refills | Status: DC

## 2018-09-11 NOTE — Progress Notes (Signed)
Pre visit review using our clinic review tool, if applicable. No additional management support is needed unless otherwise documented below in the visit note. 

## 2018-09-11 NOTE — Progress Notes (Signed)
HPI  Pt presents to the clinic today with c/o headache, runny nose, nasal congestion, left ear pain and cough. She reports this started 2-3 weeks ago. She describes the headache as pressure. She is not blowing much out of her nose. She denies decreased hearing or drainage. The cough is nonproductive. She denies fever, chills or body aches. She has tried Sudafed, Tylenol Cold and Sinus and Zyrtec with minimal relief. She has not had sick contacts. She has a history of allergies.  She also reports internal non bleeding hemorrhoids. She has been using Preparation H with minimal relief. She wants to know if there is anything prescription that she can take for this.  Review of Systems     Past Medical History:  Diagnosis Date  . Allergy   . Chicken pox   . Frequent headaches   . IBS (irritable bowel syndrome)     Family History  Problem Relation Age of Onset  . Arthritis Father   . Hyperlipidemia Father   . Hypertension Father   . Arthritis Maternal Grandmother   . Stroke Maternal Grandfather   . Cancer Paternal Grandmother        Ovarian   . Cancer Paternal Aunt        ovarian cancer    Social History   Socioeconomic History  . Marital status: Married    Spouse name: Not on file  . Number of children: Not on file  . Years of education: Not on file  . Highest education level: Not on file  Occupational History  . Not on file  Social Needs  . Financial resource strain: Not on file  . Food insecurity:    Worry: Not on file    Inability: Not on file  . Transportation needs:    Medical: Not on file    Non-medical: Not on file  Tobacco Use  . Smoking status: Never Smoker  . Smokeless tobacco: Never Used  Substance and Sexual Activity  . Alcohol use: Yes    Alcohol/week: 0.0 standard drinks    Comment: rare  . Drug use: No  . Sexual activity: Yes    Birth control/protection: Pill  Lifestyle  . Physical activity:    Days per week: Not on file    Minutes per session:  Not on file  . Stress: Not on file  Relationships  . Social connections:    Talks on phone: Not on file    Gets together: Not on file    Attends religious service: Not on file    Active member of club or organization: Not on file    Attends meetings of clubs or organizations: Not on file    Relationship status: Not on file  . Intimate partner violence:    Fear of current or ex partner: Not on file    Emotionally abused: Not on file    Physically abused: Not on file    Forced sexual activity: Not on file  Other Topics Concern  . Not on file  Social History Narrative  . Not on file    No Known Allergies   Constitutional: Positive headache. Denies fatigue, fever or abrupt weight changes.  HEENT:  Positive runny nose, nasal congestion and ear pain. Denies eye redness, ear pain, ringing in the ears, wax buildup, or sore throat. Respiratory: Positive cough. Denies difficulty breathing or shortness of breath.  Cardiovascular: Denies chest pain, chest tightness, palpitations or swelling in the hands or feet.  GI: Pt reports internal hemorrhoids.  Denies constipation, diarrhea or blood in her stool.   No other specific complaints in a complete review of systems (except as listed in HPI above).  Objective:   BP 106/64   Pulse 91   Temp 98 F (36.7 C)   Ht 5\' 6"  (1.676 m)   Wt 169 lb (76.7 kg)   LMP 08/30/2018 (Exact Date)   SpO2 98%   BMI 27.28 kg/m   General: Appears her stated age, well developed, well nourished in NAD. HEENT: Head: normal shape and size, no sinus tenderness noted; Ears: bilateral cerumen impaction; Nose: mucosa boggy and moist, turbinates swollen; Throat/Mouth: + PND. Teeth present, mucosa pink and moist, no exudate noted, no lesions or ulcerations noted.  Neck:  No adenopathy noted.  Cardiovascular: Normal rate and rhythm. S1,S2 noted.  No murmur, rubs or gallops noted.  Pulmonary/Chest: Normal effort and positive vesicular breath sounds. No respiratory  distress. No wheezes, rales or ronchi noted.  Abdomen: Soft, nontender. Rectal: Deferred.     Assessment & Plan:   Acute Viral Sinusitis  Can use a Neti Pot which can be purchased from your local drug store. RX for Pred Taper x 6 days. RX for Azithromax x 5 days to start taking if worse May want to consider starting Zyrtec and Flonase OTC once done with RX Prednisone  Hemorrhoids:  RX for Hydrocortisone 25 mg supp BID x 6 days If no improvement, will try Proctofoam Discussed staying well hydrated and avoiding constipation  RTC as needed or if symptoms persist. Webb Silversmith, NP \

## 2018-09-11 NOTE — Patient Instructions (Signed)

## 2018-09-19 ENCOUNTER — Other Ambulatory Visit: Payer: Self-pay

## 2018-09-19 ENCOUNTER — Encounter: Payer: Self-pay | Admitting: Internal Medicine

## 2018-09-19 ENCOUNTER — Other Ambulatory Visit (INDEPENDENT_AMBULATORY_CARE_PROVIDER_SITE_OTHER): Payer: BLUE CROSS/BLUE SHIELD

## 2018-09-19 ENCOUNTER — Ambulatory Visit (INDEPENDENT_AMBULATORY_CARE_PROVIDER_SITE_OTHER): Payer: BLUE CROSS/BLUE SHIELD

## 2018-09-19 DIAGNOSIS — E538 Deficiency of other specified B group vitamins: Secondary | ICD-10-CM | POA: Diagnosis not present

## 2018-09-19 LAB — VITAMIN B12: Vitamin B-12: 458 pg/mL (ref 211–911)

## 2018-09-19 MED ORDER — CYANOCOBALAMIN 1000 MCG/ML IJ SOLN
1000.0000 ug | Freq: Once | INTRAMUSCULAR | Status: AC
  Administered 2018-09-19: 1000 ug via INTRAMUSCULAR

## 2018-09-19 NOTE — Addendum Note (Signed)
Addended by: Lurlean Nanny on: 09/19/2018 09:12 AM   Modules accepted: Orders

## 2018-09-19 NOTE — Progress Notes (Signed)
Per orders of Webb Silversmith, NP injection of B12 given by Kris Mouton. Patient tolerated injection well.  Patient had her B12 level checked today prior to her injection.

## 2018-09-20 MED ORDER — HYDROCORTISONE ACE-PRAMOXINE 1-1 % RE FOAM: 1.0000 | Freq: Two times a day (BID) | RECTAL | 0 refills | Status: DC

## 2018-09-20 NOTE — Addendum Note (Signed)
Addended by: Jearld Fenton on: 09/20/2018 01:37 PM   Modules accepted: Orders

## 2018-11-03 ENCOUNTER — Ambulatory Visit (INDEPENDENT_AMBULATORY_CARE_PROVIDER_SITE_OTHER): Payer: BLUE CROSS/BLUE SHIELD | Admitting: Internal Medicine

## 2018-11-03 ENCOUNTER — Encounter: Payer: Self-pay | Admitting: Internal Medicine

## 2018-11-03 DIAGNOSIS — F5101 Primary insomnia: Secondary | ICD-10-CM | POA: Diagnosis not present

## 2018-11-03 MED ORDER — TRAZODONE HCL 50 MG PO TABS: 25.0000 mg | ORAL_TABLET | Freq: Every evening | ORAL | 0 refills | Status: DC | PRN

## 2018-11-03 NOTE — Progress Notes (Signed)
Virtual Visit via Video Note  I connected with Robin West on 11/03/18 at  4:00 PM EDT by a video enabled telemedicine application and verified that I am speaking with the correct person using two identifiers.  Location: Patient: In her Personal Vehicle Provider: Home   I discussed the limitations of evaluation and management by telemedicine and the availability of in person appointments. The patient expressed understanding and agreed to proceed.  History of Present Illness:   Pt reports difficulty sleeping. She is having trouble falling asleep. She is able to stay asleep. She does not snore. She does not feel fatigued during the day. She is not napping during the day. She has tried Unisom and Tylenol PM but reports it hasn't seemed to work that well. She reports Benadryl works but causes her to wake up with a migraine. She is requesting something to help her sleep.    Past Medical History:  Diagnosis Date  . Allergy   . Chicken pox   . Frequent headaches   . IBS (irritable bowel syndrome)     Current Outpatient Medications  Medication Sig Dispense Refill  . azithromycin (ZITHROMAX) 250 MG tablet Take 2 tabs today, then 1 tab daily x 4 days 6 tablet 0  . dicyclomine (BENTYL) 10 MG capsule TAKE 1 CAPSULE (10 MG TOTAL) BY MOUTH 3 (THREE) TIMES DAILY BEFORE MEALS. 270 capsule 0  . hydrocortisone (ANUSOL-HC) 25 MG suppository Place 1 suppository (25 mg total) rectally 2 (two) times daily. 12 suppository 0  . hydrocortisone-pramoxine (PROCTOFOAM HC) rectal foam Place 1 applicator rectally 2 (two) times daily. 10 g 0  . JUNEL FE 1/20 1-20 MG-MCG tablet Take 1 tablet by mouth daily.  3  . Multiple Vitamin (MULTI-VITAMINS) TABS Take by mouth.    . Nutritional Supplements (JUICE PLUS FIBRE PO) Take 6 capsules by mouth daily.    . predniSONE (DELTASONE) 10 MG tablet Take 6 tabs day 1, 5 tabs day 2, 4 tabs day 3, 3 tabs day 4, 2 tabs day 5, 1 tab day 6 21 tablet 0  . promethazine (PHENERGAN)  25 MG tablet TAKE 1 TABLET (25 MG TOTAL) BY MOUTH EVERY 6 (SIX) HOURS AS NEEDED FOR NAUSEA.    . SUMAtriptan (IMITREX) 100 MG tablet Take 1 tablet by mouth. For migraine may repeat in 2hrs if not better Max 2/24hr  2  . SUMAtriptan 6 MG/0.5ML SOAJ INJECT 0.5MLS SUBCUTANEOUSLY ONCE AS NEEDED FOR MIGRAINE. MAY REPEAT IN 1 HOUR IF NEEDED  3  . traZODone (DESYREL) 50 MG tablet Take 0.5-1 tablets (25-50 mg total) by mouth at bedtime as needed for sleep. 30 tablet 0   No current facility-administered medications for this visit.     No Known Allergies  Family History  Problem Relation Age of Onset  . Arthritis Father   . Hyperlipidemia Father   . Hypertension Father   . Arthritis Maternal Grandmother   . Stroke Maternal Grandfather   . Cancer Paternal Grandmother        Ovarian   . Cancer Paternal Aunt        ovarian cancer    Social History   Socioeconomic History  . Marital status: Married    Spouse name: Not on file  . Number of children: Not on file  . Years of education: Not on file  . Highest education level: Not on file  Occupational History  . Not on file  Social Needs  . Financial resource strain: Not on file  .  Food insecurity:    Worry: Not on file    Inability: Not on file  . Transportation needs:    Medical: Not on file    Non-medical: Not on file  Tobacco Use  . Smoking status: Never Smoker  . Smokeless tobacco: Never Used  Substance and Sexual Activity  . Alcohol use: Yes    Alcohol/week: 0.0 standard drinks    Comment: rare  . Drug use: No  . Sexual activity: Yes    Birth control/protection: Pill  Lifestyle  . Physical activity:    Days per week: Not on file    Minutes per session: Not on file  . Stress: Not on file  Relationships  . Social connections:    Talks on phone: Not on file    Gets together: Not on file    Attends religious service: Not on file    Active member of club or organization: Not on file    Attends meetings of clubs or  organizations: Not on file    Relationship status: Not on file  . Intimate partner violence:    Fear of current or ex partner: Not on file    Emotionally abused: Not on file    Physically abused: Not on file    Forced sexual activity: Not on file  Other Topics Concern  . Not on file  Social History Narrative  . Not on file     Constitutional: Denies fever, malaise, fatigue, headache or abrupt weight changes.  Neurological: Pt reports insomnia. Denies dizziness, difficulty with memory, difficulty with speech or problems with balance and coordination.  Psych: Denies anxiety, depression, SI/HI.  No other specific complaints in a complete review of systems (except as listed in HPI above).   Wt Readings from Last 3 Encounters:  09/11/18 169 lb (76.7 kg)  05/29/18 171 lb (77.6 kg)  02/28/18 163 lb (73.9 kg)    General: Appears her stated age, well developed, well nourished in NAD. Skin: Warm, dry and intact.  Pulmonary/Chest: Normal effort. No respiratory distress.  Abdomen: Soft and nontender. Normal bowel sounds. No distention or masses noted. Liver, spleen and kidneys non palpable. Neurological: Alert and oriented.  Psychiatric: Mood and affect normal. Behavior is normal. Judgment and thought content normal.    BMET    Component Value Date/Time   NA 136 05/29/2018 1443   K 3.6 05/29/2018 1443   CL 103 05/29/2018 1443   CO2 25 05/29/2018 1443   GLUCOSE 101 (H) 05/29/2018 1443   BUN 20 05/29/2018 1443   CREATININE 0.71 05/29/2018 1443   CALCIUM 8.9 05/29/2018 1443   GFRNONAA >60 02/29/2016 1902   GFRAA >60 02/29/2016 1902    Lipid Panel     Component Value Date/Time   CHOL 175 02/28/2018 0842   TRIG 93.0 02/28/2018 0842   HDL 47.10 02/28/2018 0842   CHOLHDL 4 02/28/2018 0842   VLDL 18.6 02/28/2018 0842   LDLCALC 110 (H) 02/28/2018 0842    CBC    Component Value Date/Time   WBC 7.9 05/29/2018 1443   RBC 3.95 05/29/2018 1443   HGB 12.2 05/29/2018 1443   HCT  35.8 (L) 05/29/2018 1443   PLT 317.0 05/29/2018 1443   MCV 90.7 05/29/2018 1443   MCH 31.0 02/29/2016 1902   MCHC 34.0 05/29/2018 1443   RDW 13.0 05/29/2018 1443    Hgb A1C Lab Results  Component Value Date   HGBA1C 5.5 05/29/2018         Assessment and Plan:  Insomnia:  Encouraged regular sleep routine Will trial Trazadone, RX sent to pharmacy Update me if no improvement in 1-2 weeks  Follow Up Instructions:    I discussed the assessment and treatment plan with the patient. The patient was provided an opportunity to ask questions and all were answered. The patient agreed with the plan and demonstrated an understanding of the instructions.   The patient was advised to call back or seek an in-person evaluation if the symptoms worsen or if the condition fails to improve as anticipated.    Webb Silversmith, NP

## 2018-11-06 ENCOUNTER — Encounter: Payer: Self-pay | Admitting: Internal Medicine

## 2018-11-06 DIAGNOSIS — F5101 Primary insomnia: Secondary | ICD-10-CM | POA: Insufficient documentation

## 2018-11-06 NOTE — Patient Instructions (Signed)

## 2018-11-25 ENCOUNTER — Other Ambulatory Visit: Payer: Self-pay | Admitting: Internal Medicine

## 2018-12-04 DIAGNOSIS — D485 Neoplasm of uncertain behavior of skin: Secondary | ICD-10-CM | POA: Diagnosis not present

## 2018-12-04 DIAGNOSIS — D225 Melanocytic nevi of trunk: Secondary | ICD-10-CM | POA: Diagnosis not present

## 2018-12-04 DIAGNOSIS — Z1283 Encounter for screening for malignant neoplasm of skin: Secondary | ICD-10-CM | POA: Diagnosis not present

## 2018-12-04 DIAGNOSIS — D2260 Melanocytic nevi of unspecified upper limb, including shoulder: Secondary | ICD-10-CM | POA: Diagnosis not present

## 2019-02-08 ENCOUNTER — Encounter: Payer: Self-pay | Admitting: Internal Medicine

## 2019-02-23 DIAGNOSIS — G43719 Chronic migraine without aura, intractable, without status migrainosus: Secondary | ICD-10-CM | POA: Diagnosis not present

## 2019-02-27 ENCOUNTER — Encounter: Payer: BLUE CROSS/BLUE SHIELD | Admitting: Internal Medicine

## 2019-03-04 ENCOUNTER — Other Ambulatory Visit: Payer: Self-pay | Admitting: Internal Medicine

## 2019-03-05 ENCOUNTER — Other Ambulatory Visit: Payer: Self-pay

## 2019-03-05 ENCOUNTER — Other Ambulatory Visit (HOSPITAL_COMMUNITY)
Admission: RE | Admit: 2019-03-05 | Discharge: 2019-03-05 | Disposition: A | Discharge status: Home / Self Care | Payer: BC Managed Care – PPO | Source: Ambulatory Visit | Attending: Internal Medicine | Admitting: Internal Medicine

## 2019-03-05 ENCOUNTER — Other Ambulatory Visit: Payer: Self-pay | Admitting: Internal Medicine

## 2019-03-05 ENCOUNTER — Encounter: Payer: Self-pay | Admitting: Internal Medicine

## 2019-03-05 ENCOUNTER — Ambulatory Visit (INDEPENDENT_AMBULATORY_CARE_PROVIDER_SITE_OTHER): Payer: BC Managed Care – PPO | Admitting: Internal Medicine

## 2019-03-05 VITALS — BP 118/76 | HR 87 | Temp 98.7°F | Ht 66.0 in | Wt 174.0 lb

## 2019-03-05 DIAGNOSIS — Z0001 Encounter for general adult medical examination with abnormal findings: Secondary | ICD-10-CM | POA: Diagnosis not present

## 2019-03-05 DIAGNOSIS — Z124 Encounter for screening for malignant neoplasm of cervix: Secondary | ICD-10-CM | POA: Diagnosis not present

## 2019-03-05 DIAGNOSIS — R51 Headache: Secondary | ICD-10-CM | POA: Diagnosis not present

## 2019-03-05 DIAGNOSIS — Z113 Encounter for screening for infections with a predominantly sexual mode of transmission: Secondary | ICD-10-CM

## 2019-03-05 DIAGNOSIS — Z1239 Encounter for other screening for malignant neoplasm of breast: Secondary | ICD-10-CM | POA: Diagnosis not present

## 2019-03-05 DIAGNOSIS — Z0000 Encounter for general adult medical examination without abnormal findings: Secondary | ICD-10-CM

## 2019-03-05 DIAGNOSIS — F5101 Primary insomnia: Secondary | ICD-10-CM

## 2019-03-05 DIAGNOSIS — Z1231 Encounter for screening mammogram for malignant neoplasm of breast: Secondary | ICD-10-CM

## 2019-03-05 DIAGNOSIS — R519 Headache, unspecified: Secondary | ICD-10-CM

## 2019-03-05 DIAGNOSIS — K582 Mixed irritable bowel syndrome: Secondary | ICD-10-CM

## 2019-03-05 LAB — LIPID PANEL
Cholesterol: 178 mg/dL (ref 0–200)
HDL: 52.3 mg/dL (ref >39.00)
LDL Cholesterol: 111 mg/dL — ABNORMAL HIGH (ref 0–99)
NonHDL: 125.44
Total CHOL/HDL Ratio: 3
Triglycerides: 70 mg/dL (ref 0.0–149.0)
VLDL: 14 mg/dL (ref 0.0–40.0)

## 2019-03-05 LAB — CBC
HCT: 34.4 % — ABNORMAL LOW (ref 36.0–46.0)
Hemoglobin: 11.6 g/dL — ABNORMAL LOW (ref 12.0–15.0)
MCHC: 33.6 g/dL (ref 30.0–36.0)
MCV: 91.4 fl (ref 78.0–100.0)
Platelets: 334 10*3/uL (ref 150.0–400.0)
RBC: 3.76 Mil/uL — ABNORMAL LOW (ref 3.87–5.11)
RDW: 12.6 % (ref 11.5–15.5)
WBC: 7.8 10*3/uL (ref 4.0–10.5)

## 2019-03-05 LAB — COMPREHENSIVE METABOLIC PANEL
ALT: 21 U/L (ref 0–35)
AST: 17 U/L (ref 0–37)
Albumin: 4.1 g/dL (ref 3.5–5.2)
Alkaline Phosphatase: 40 U/L (ref 39–117)
BUN: 23 mg/dL (ref 6–23)
CO2: 25 mEq/L (ref 19–32)
Calcium: 8.8 mg/dL (ref 8.4–10.5)
Chloride: 105 mEq/L (ref 96–112)
Creatinine, Ser: 0.53 mg/dL (ref 0.40–1.20)
GFR: 127.58 mL/min (ref >60.00)
Glucose, Bld: 88 mg/dL (ref 70–99)
Potassium: 3.8 mEq/L (ref 3.5–5.1)
Sodium: 137 mEq/L (ref 135–145)
Total Bilirubin: 0.3 mg/dL (ref 0.2–1.2)
Total Protein: 6.6 g/dL (ref 6.0–8.3)

## 2019-03-05 LAB — VITAMIN D 25 HYDROXY (VIT D DEFICIENCY, FRACTURES): VITD: 46.87 ng/mL (ref 30.00–100.00)

## 2019-03-05 NOTE — Patient Instructions (Signed)
Health Maintenance, Female Adopting a healthy lifestyle and getting preventive care are important in promoting health and wellness. Ask your health care provider about:  The right schedule for you to have regular tests and exams.  Things you can do on your own to prevent diseases and keep yourself healthy. What should I know about diet, weight, and exercise? Eat a healthy diet   Eat a diet that includes plenty of vegetables, fruits, low-fat dairy products, and lean protein.  Do not eat a lot of foods that are high in solid fats, added sugars, or sodium. Maintain a healthy weight Body mass index (BMI) is used to identify weight problems. It estimates body fat based on height and weight. Your health care provider can help determine your BMI and help you achieve or maintain a healthy weight. Get regular exercise Get regular exercise. This is one of the most important things you can do for your health. Most adults should:  Exercise for at least 150 minutes each week. The exercise should increase your heart rate and make you sweat (moderate-intensity exercise).  Do strengthening exercises at least twice a week. This is in addition to the moderate-intensity exercise.  Spend less time sitting. Even light physical activity can be beneficial. Watch cholesterol and blood lipids Have your blood tested for lipids and cholesterol at 40 years of age, then have this test every 5 years. Have your cholesterol levels checked more often if:  Your lipid or cholesterol levels are high.  You are older than 40 years of age.  You are at high risk for heart disease. What should I know about cancer screening? Depending on your health history and family history, you may need to have cancer screening at various ages. This may include screening for:  Breast cancer.  Cervical cancer.  Colorectal cancer.  Skin cancer.  Lung cancer. What should I know about heart disease, diabetes, and high blood  pressure? Blood pressure and heart disease  High blood pressure causes heart disease and increases the risk of stroke. This is more likely to develop in people who have high blood pressure readings, are of African descent, or are overweight.  Have your blood pressure checked: ? Every 3-5 years if you are 18-39 years of age. ? Every year if you are 40 years old or older. Diabetes Have regular diabetes screenings. This checks your fasting blood sugar level. Have the screening done:  Once every three years after age 40 if you are at a normal weight and have a low risk for diabetes.  More often and at a younger age if you are overweight or have a high risk for diabetes. What should I know about preventing infection? Hepatitis B If you have a higher risk for hepatitis B, you should be screened for this virus. Talk with your health care provider to find out if you are at risk for hepatitis B infection. Hepatitis C Testing is recommended for:  Everyone born from 1945 through 1965.  Anyone with known risk factors for hepatitis C. Sexually transmitted infections (STIs)  Get screened for STIs, including gonorrhea and chlamydia, if: ? You are sexually active and are younger than 40 years of age. ? You are older than 40 years of age and your health care provider tells you that you are at risk for this type of infection. ? Your sexual activity has changed since you were last screened, and you are at increased risk for chlamydia or gonorrhea. Ask your health care provider if   you are at risk.  Ask your health care provider about whether you are at high risk for HIV. Your health care provider may recommend a prescription medicine to help prevent HIV infection. If you choose to take medicine to prevent HIV, you should first get tested for HIV. You should then be tested every 3 months for as long as you are taking the medicine. Pregnancy  If you are about to stop having your period (premenopausal) and  you may become pregnant, seek counseling before you get pregnant.  Take 400 to 800 micrograms (mcg) of folic acid every day if you become pregnant.  Ask for birth control (contraception) if you want to prevent pregnancy. Osteoporosis and menopause Osteoporosis is a disease in which the bones lose minerals and strength with aging. This can result in bone fractures. If you are 65 years old or older, or if you are at risk for osteoporosis and fractures, ask your health care provider if you should:  Be screened for bone loss.  Take a calcium or vitamin D supplement to lower your risk of fractures.  Be given hormone replacement therapy (HRT) to treat symptoms of menopause. Follow these instructions at home: Lifestyle  Do not use any products that contain nicotine or tobacco, such as cigarettes, e-cigarettes, and chewing tobacco. If you need help quitting, ask your health care provider.  Do not use street drugs.  Do not share needles.  Ask your health care provider for help if you need support or information about quitting drugs. Alcohol use  Do not drink alcohol if: ? Your health care provider tells you not to drink. ? You are pregnant, may be pregnant, or are planning to become pregnant.  If you drink alcohol: ? Limit how much you use to 0-1 drink a day. ? Limit intake if you are breastfeeding.  Be aware of how much alcohol is in your drink. In the [place] one drink equals one 12 oz bottle of beer (355 mL), one 5 oz glass of wine (148 mL), or one 1 oz glass of hard liquor (44 mL). General instructions  Schedule regular health, dental, and eye exams.  Stay current with your vaccines.  Tell your health care provider if: ? You often feel depressed. ? You have ever been abused or do not feel safe at home. Summary  Adopting a healthy lifestyle and getting preventive care are important in promoting health and wellness.  Follow your health care provider's instructions about healthy  diet, exercising, and getting tested or screened for diseases.  Follow your health care provider's instructions on monitoring your cholesterol and blood pressure. This information is not intended to replace advice given to you by your health care provider. Make sure you discuss any questions you have with your health care provider. Document Released: 01/04/2011 Document Revised: 06/14/2018 Document Reviewed: 06/14/2018 Elsevier Patient Education  2020 [employer]  

## 2019-03-05 NOTE — Progress Notes (Signed)
Subjective:    Patient ID: Robin West, female    DOB: [birth year], 38 y.o.   MRN: [id number]  HPI  Pt presents to the clinic today for her annual exam.  IBS: Alternating constipation and diarrhea. She takes Dicyclomine as needed with good relief.  Frequent Headaches: These occur every few weeks. She is taking Emgality monthly. She takes Imitrex, Zofran/Phenergan as needed with good relief. She follows with Dr. Manuella Ghazi.  Insomnia: She has trouble falling asleep. She is taking Trazadone as needed with good relief. There is no sleep study on file.   Flu: 02/2019 Tetanus: 2014 Pap Smear: 10/2016- [place]: never Vision Screening: annually Dentist: biannually  Diet: She does eat meat. She consumes fruits and veggies daily. She does not eat a lot of fried foods. She drinks mostly coffee, water, coke zero occasionally. Exercise: yoga, beach body workouts, walks 2.5 miles daily.   Review of Systems      Past Medical History:  Diagnosis Date   Allergy    Chicken pox    Frequent headaches    IBS (irritable bowel syndrome)     Current Outpatient Medications  Medication Sig Dispense Refill   dicyclomine (BENTYL) 10 MG capsule TAKE 1 CAPSULE (10 MG TOTAL) BY MOUTH 3 (THREE) TIMES DAILY BEFORE MEALS. 270 capsule 0   JUNEL FE 1/20 1-20 MG-MCG tablet Take 1 tablet by mouth daily.  3   Multiple Vitamin (MULTI-VITAMINS) TABS Take by mouth.     Nutritional Supplements (JUICE PLUS FIBRE PO) Take 6 capsules by mouth daily.     promethazine (PHENERGAN) 25 MG tablet TAKE 1 TABLET (25 MG TOTAL) BY MOUTH EVERY 6 (SIX) HOURS AS NEEDED FOR NAUSEA.     SUMAtriptan (IMITREX) 100 MG tablet Take 1 tablet by mouth. For migraine may repeat in 2hrs if not better Max 2/24hr  2   SUMAtriptan 6 MG/0.5ML SOAJ INJECT 0.5MLS SUBCUTANEOUSLY ONCE AS NEEDED FOR MIGRAINE. MAY REPEAT IN 1 HOUR IF NEEDED  3   traZODone (DESYREL) 50 MG tablet TAKE 0.5-1 TABLETS (25-50 MG TOTAL) BY  MOUTH AT BEDTIME AS NEEDED FOR SLEEP. 90 tablet 0   No current facility-administered medications for this visit.     No Known Allergies  Family History  Problem Relation Age of Onset   Arthritis Father    Hyperlipidemia Father    Hypertension Father    Arthritis Maternal Grandmother    Stroke Maternal Grandfather    Cancer Paternal Grandmother        Ovarian    Cancer Paternal Aunt        ovarian cancer    Social History   Socioeconomic History   Marital status: Married    Spouse name: Not on file   Number of children: Not on file   Years of education: Not on file   Highest education level: Not on file  Occupational History   Not on file  Social Needs   Financial resource strain: Not on file   Food insecurity    Worry: Not on file    Inability: Not on file   Transportation needs    Medical: Not on file    Non-medical: Not on file  Tobacco Use   Smoking status: Never Smoker   Smokeless tobacco: Never Used  Substance and Sexual Activity   Alcohol use: Yes    Alcohol/week: 0.0 standard drinks    Comment: rare   Drug use: No   Sexual activity: Yes    Birth  control/protection: Pill  Lifestyle   Physical activity    Days per week: Not on file    Minutes per session: Not on file   Stress: Not on file  Relationships   Social connections    Talks on phone: Not on file    Gets together: Not on file    Attends religious service: Not on file    Active member of club or organization: Not on file    Attends meetings of clubs or organizations: Not on file    Relationship status: Not on file   Intimate partner violence    Fear of current or ex partner: Not on file    Emotionally abused: Not on file    Physically abused: Not on file    Forced sexual activity: Not on file  Other Topics Concern   Not on file  Social History Narrative   Not on file     Constitutional: Pt reports intermittent headaches. Denies fever, malaise, fatigue, or  abrupt weight changes.  HEENT: Denies eye pain, eye redness, ear pain, ringing in the ears, wax buildup, runny nose, nasal congestion, bloody nose, or sore throat. Respiratory: Denies difficulty breathing, shortness of breath, cough or sputum production.   Cardiovascular: Denies chest pain, chest tightness, palpitations or swelling in the hands or feet.  Gastrointestinal: Pt reports intermittent abdominal cramping, constipation, diarrhea.  Denies or blood in the stool.  GU: Denies urgency, frequency, pain with urination, burning sensation, blood in urine, odor or discharge. Musculoskeletal: Denies decrease in range of motion, difficulty with gait, muscle pain or joint pain and swelling.  Skin: Denies redness, rashes, lesions or ulcercations.  Neurological: Pt reports insomnia. Denies dizziness, difficulty with memory, difficulty with speech or problems with balance and coordination.  Psych: Denies anxiety, depression, SI/HI.  No other specific complaints in a complete review of systems (except as listed in HPI above).  Objective:   Physical Exam  BP 118/76    Pulse 87    Temp 98.7 F (37.1 C) (Temporal)    Ht 5\' 6"  (1.676 m)    Wt 174 lb (78.9 kg)    LMP 02/11/2019    SpO2 98%    BMI 28.08 kg/m  Wt Readings from Last 3 Encounters:  03/05/19 174 lb (78.9 kg)  09/11/18 169 lb (76.7 kg)  05/29/18 171 lb (77.6 kg)    General: Appears her stated age, well developed, well nourished in NAD. Skin: Warm, dry and intact. No rashes noted. HEENT: Head: normal shape and size; Eyes: sclera white, no icterus, conjunctiva pink, PERRLA and EOMs intact; Ears: Tm's gray and intact, normal light reflex;  Neck:  Neck supple, trachea midline. No masses, lumps or thyromegaly present.  Cardiovascular: Normal rate and rhythm. S1,S2 noted.  No murmur, rubs or gallops noted. No JVD or BLE edema.  Pulmonary/Chest: Normal effort and positive vesicular breath sounds. No respiratory distress. No wheezes, rales or  ronchi noted.  Abdomen: Soft and nontender. Normal bowel sounds. No distention or masses noted. Liver, spleen and kidneys non palpable. Pelvic/Breast: Normal female anatomy. Left nipple inverted. Fibrocystic breasts bilaterally. No mass or lump identified. Cervix not visualized. No CMT. Adnexa non palpable. Musculoskeletal: Strength 5/5 BUE/BLE. No difficulty with gait.  Neurological: Alert and oriented. Cranial nerves II-XII grossly intact. Coordination normal.  Psychiatric: Mood and affect normal. Behavior is normal. Judgment and thought content normal.     BMET    Component Value Date/Time   NA 136 05/29/2018 1443   K 3.6  05/29/2018 1443   CL 103 05/29/2018 1443   CO2 25 05/29/2018 1443   GLUCOSE 101 (H) 05/29/2018 1443   BUN 20 05/29/2018 1443   CREATININE 0.71 05/29/2018 1443   CALCIUM 8.9 05/29/2018 1443   GFRNONAA >60 02/29/2016 1902   GFRAA >60 02/29/2016 1902    Lipid Panel     Component Value Date/Time   CHOL 175 02/28/2018 0842   TRIG 93.0 02/28/2018 0842   HDL 47.10 02/28/2018 0842   CHOLHDL 4 02/28/2018 0842   VLDL 18.6 02/28/2018 0842   LDLCALC 110 (H) 02/28/2018 0842    CBC    Component Value Date/Time   WBC 7.9 05/29/2018 1443   RBC 3.95 05/29/2018 1443   HGB 12.2 05/29/2018 1443   HCT 35.8 (L) 05/29/2018 1443   PLT 317.0 05/29/2018 1443   MCV 90.7 05/29/2018 1443   MCH 31.0 02/29/2016 1902   MCHC 34.0 05/29/2018 1443   RDW 13.0 05/29/2018 1443    Hgb A1C Lab Results  Component Value Date   HGBA1C 5.5 05/29/2018            Assessment & Plan:   Preventative Health Maintenance:  Flu shot UTD Tetanus UTD Pap smear today with STD screening Mammogram ordered, she will [facility] to schedule, number provided Encouraged her to consume a balanced diet and exercise regimen Advised her to see an eye doctor and dentist annually Will check CBC, CMET, Lipid, and Vit D today  RTC in 1 year, sooner if needed Webb Silversmith, NP

## 2019-03-05 NOTE — Assessment & Plan Note (Signed)
Stable on Emgality, Imitrex, Zofran/Phenergan She will continue to follow with Dr. Manuella Ghazi

## 2019-03-05 NOTE — Addendum Note (Signed)
Addended by: Lurlean Nanny on: 03/05/2019 12:35 PM   Modules accepted: Orders

## 2019-03-05 NOTE — Assessment & Plan Note (Signed)
Continue Bentyl prn

## 2019-03-05 NOTE — Assessment & Plan Note (Signed)
Continue Trazadone prn 

## 2019-03-06 ENCOUNTER — Encounter: Payer: Self-pay | Admitting: Internal Medicine

## 2019-03-07 LAB — CYTOLOGY - PAP
Adequacy: ABSENT
Chlamydia: NEGATIVE
Diagnosis: NEGATIVE
HPV: NOT DETECTED
Neisseria Gonorrhea: NEGATIVE
Trichomonas: NEGATIVE

## 2019-03-16 ENCOUNTER — Ambulatory Visit
Admission: RE | Admit: 2019-03-16 | Discharge: 2019-03-16 | Disposition: A | Discharge status: Home / Self Care | Payer: BC Managed Care – PPO | Source: Ambulatory Visit | Attending: Internal Medicine | Admitting: Internal Medicine

## 2019-03-16 ENCOUNTER — Other Ambulatory Visit: Payer: Self-pay

## 2019-03-16 DIAGNOSIS — Z1231 Encounter for screening mammogram for malignant neoplasm of breast: Secondary | ICD-10-CM

## 2019-04-03 ENCOUNTER — Encounter: Payer: Self-pay | Admitting: Internal Medicine

## 2019-05-06 ENCOUNTER — Encounter: Payer: Self-pay | Admitting: Internal Medicine

## 2019-05-07 ENCOUNTER — Other Ambulatory Visit: Payer: Self-pay | Admitting: Internal Medicine

## 2019-05-07 MED ORDER — HYDROCORTISONE ACETATE 25 MG RE SUPP: 25.0000 mg | Freq: Two times a day (BID) | RECTAL | 0 refills | Status: DC

## 2019-06-05 ENCOUNTER — Encounter: Payer: Self-pay | Admitting: Internal Medicine

## 2019-06-05 ENCOUNTER — Ambulatory Visit: Payer: BC Managed Care – PPO | Admitting: Internal Medicine

## 2019-06-05 ENCOUNTER — Other Ambulatory Visit: Payer: Self-pay

## 2019-06-05 VITALS — BP 114/76 | HR 89 | Temp 98.0°F | Wt 176.0 lb

## 2019-06-05 DIAGNOSIS — K582 Mixed irritable bowel syndrome: Secondary | ICD-10-CM

## 2019-06-05 DIAGNOSIS — K219 Gastro-esophageal reflux disease without esophagitis: Secondary | ICD-10-CM

## 2019-06-05 DIAGNOSIS — K649 Unspecified hemorrhoids: Secondary | ICD-10-CM

## 2019-06-05 MED ORDER — OMEPRAZOLE 40 MG PO CPDR: 40.0000 mg | DELAYED_RELEASE_CAPSULE | Freq: Every day | ORAL | 3 refills | Status: DC

## 2019-06-05 NOTE — Progress Notes (Signed)
Subjective:    Patient ID: Robin West, female    DOB: 1980-12-31, 38 y.o.   MRN: [id number]  HPI  Pt presents to the clinic today to discuss IBS, reflux and hemorroids.  IBS: She reports alternating constipation and diarrhea. She reports intermittent diffuse abdominal pain and bloating. She takes Bentyl but reports it makes her constipated if she takes it 3 times a day so she has not been taking it for a while. There is no colonoscopy on file. She had a sigmoidoscopy over 18 years ago. She reports consuming a healthy diet with plenty of proteins and veggies. She has noticed that soy seems to make it worse. She doe not follow with GI.   Acid reflux: This started 5 days ago. She reports it is worse around dinner time and at night. She feels like food is getting caught in the esophagus. She reports last night, she went to bend over and felt food/acid in her mouth. She has been trying to sleep sitting up. She tried OTC acid reducer with minimal relief, but reports it was expired. She denies any change in diet or stress levels.   Hemorroids: She really is not sure if this is hemorrhoids or scar tissue. She reports a history of hemorroid surgery which was not a good experience for her. She mentions she has tried prep H suppositories with minimal relief. She was unable to afford Anusol suppositories.   Review of Systems      Past Medical History:  Diagnosis Date  . Allergy   . Chicken pox   . Frequent headaches   . IBS (irritable bowel syndrome)     Current Outpatient Medications  Medication Sig Dispense Refill  . dicyclomine (BENTYL) 10 MG capsule TAKE 1 CAPSULE (10 MG TOTAL) BY MOUTH 3 (THREE) TIMES DAILY BEFORE MEALS. 270 capsule 0  . Galcanezumab-gnlm (EMGALITY) 120 MG/ML SOSY Inject 120 mg into the skin every 28 (twenty-eight) days.    . hydrocortisone (ANUSOL-HC) 25 MG suppository Place 1 suppository (25 mg total) rectally 2 (two) times daily. 12 suppository 0  . JUNEL FE 1/20  1-20 MG-MCG tablet Take 1 tablet by mouth daily.  3  . Multiple Vitamin (MULTI-VITAMINS) TABS Take by mouth.    . Nutritional Supplements (JUICE PLUS FIBRE PO) Take 6 capsules by mouth daily.    . promethazine (PHENERGAN) 25 MG tablet TAKE 1 TABLET (25 MG TOTAL) BY MOUTH EVERY 6 (SIX) HOURS AS NEEDED FOR NAUSEA.    . SUMAtriptan (IMITREX) 100 MG tablet Take 1 tablet by mouth. For migraine may repeat in 2hrs if not better Max 2/24hr  2  . SUMAtriptan 6 MG/0.5ML SOAJ INJECT 0.5MLS SUBCUTANEOUSLY ONCE AS NEEDED FOR MIGRAINE. MAY REPEAT IN 1 HOUR IF NEEDED  3  . traZODone (DESYREL) 50 MG tablet TAKE 0.5-1 TABLETS (25-50 MG TOTAL) BY MOUTH AT BEDTIME AS NEEDED FOR SLEEP. 90 tablet 2   No current facility-administered medications for this visit.     No Known Allergies  Family History  Problem Relation Age of Onset  . Arthritis Father   . Hyperlipidemia Father   . Hypertension Father   . Arthritis Maternal Grandmother   . Stroke Maternal Grandfather   . Cancer Paternal Grandmother        Ovarian   . Cancer Paternal Aunt        ovarian cancer    Social History   Socioeconomic History  . Marital status: Married    Spouse name: Not on file  .  Number of children: Not on file  . Years of education: Not on file  . Highest education level: Not on file  Occupational History  . Not on file  Social Needs  . Financial resource strain: Not on file  . Food insecurity    Worry: Not on file    Inability: Not on file  . Transportation needs    Medical: Not on file    Non-medical: Not on file  Tobacco Use  . Smoking status: Never Smoker  . Smokeless tobacco: Never Used  Substance and Sexual Activity  . Alcohol use: Yes    Alcohol/week: 0.0 standard drinks    Comment: rare  . Drug use: No  . Sexual activity: Yes    Birth control/protection: Pill  Lifestyle  . Physical activity    Days per week: Not on file    Minutes per session: Not on file  . Stress: Not on file  Relationships  .  Social Herbalist on phone: Not on file    Gets together: Not on file    Attends religious service: Not on file    Active member of club or organization: Not on file    Attends meetings of clubs or organizations: Not on file    Relationship status: Not on file  . Intimate partner violence    Fear of current or ex partner: Not on file    Emotionally abused: Not on file    Physically abused: Not on file    Forced sexual activity: Not on file  Other Topics Concern  . Not on file  Social History Narrative  . Not on file     Constitutional: Denies fever, malaise, fatigue, headache or abrupt weight changes.  Respiratory: Denies difficulty breathing, shortness of breath, cough or sputum production.   Cardiovascular: Denies chest pain, chest tightness, palpitations or swelling in the hands or feet.  Gastrointestinal: Pt reports constipation, diarrhea, reflux and hemorrhoids. Denies blood in stool.    No other specific complaints in a complete review of systems (except as listed in HPI above).  Objective:   Physical Exam  BP 114/76   Pulse 89   Temp 98 F (36.7 C) (Temporal)   Wt 176 lb (79.8 kg)   LMP 05/21/2019   SpO2 98%   BMI 28.41 kg/m   Wt Readings from Last 3 Encounters:  03/05/19 174 lb (78.9 kg)  09/11/18 169 lb (76.7 kg)  05/29/18 171 lb (77.6 kg)    General: Appears her stated age, well developed in NAD. Cardiovascular: Normal rate and rhythm. S1,S2 noted.  No murmur, rubs or gallops noted.  Pulmonary/Chest: Normal effort and positive vesicular breath sounds. No respiratory distress. No wheezes, rales or ronchi noted.  Abdomen: Soft and mildly tender in the epigastric region. Normal bowel sounds. No distention or masses noted.     BMET    Component Value Date/Time   NA 137 03/05/2019 0851   K 3.8 03/05/2019 0851   CL 105 03/05/2019 0851   CO2 25 03/05/2019 0851   GLUCOSE 88 03/05/2019 0851   BUN 23 03/05/2019 0851   CREATININE 0.53 03/05/2019  0851   CALCIUM 8.8 03/05/2019 0851   GFRNONAA >60 02/29/2016 1902   GFRAA >60 02/29/2016 1902    Lipid Panel     Component Value Date/Time   CHOL 178 03/05/2019 0851   TRIG 70.0 03/05/2019 0851   HDL 52.30 03/05/2019 0851   CHOLHDL 3 03/05/2019 0851   VLDL 14.0  03/05/2019 0851   LDLCALC 111 (H) 03/05/2019 0851    CBC    Component Value Date/Time   WBC 7.8 03/05/2019 0851   RBC 3.76 (L) 03/05/2019 0851   HGB 11.6 (L) 03/05/2019 0851   HCT 34.4 (L) 03/05/2019 0851   PLT 334.0 03/05/2019 0851   MCV 91.4 03/05/2019 0851   MCH 31.0 02/29/2016 1902   MCHC 33.6 03/05/2019 0851   RDW 12.6 03/05/2019 0851    Hgb A1C Lab Results  Component Value Date   HGBA1C 5.5 05/29/2018            Assessment & Plan:   This visit occurred during the SARS-CoV-2 public health emergency.  Safety protocols were in place, including screening questions prior to the visit, additional usage of staff PPE, and extensive cleaning of exam room while observing appropriate contact time as indicated for disinfecting solutions.

## 2019-06-05 NOTE — Patient Instructions (Signed)
Diet for Irritable Bowel Syndrome When you have irritable bowel syndrome (IBS), it is very important to eat the foods and follow the eating habits that are best for your condition. IBS may cause various symptoms such as pain in the abdomen, constipation, or diarrhea. Choosing the right foods can help to ease the discomfort from these symptoms. Work with your health care provider and diet and nutrition specialist (dietitian) to find the eating plan that will help to control your symptoms. What are tips for following this plan?      Keep a food diary. This will help you identify foods that cause symptoms. Write down: ? What you eat and when you eat it. ? What symptoms you have. ? When symptoms occur in relation to your meals, such as "pain in abdomen 2 hours after dinner."  Eat your meals slowly and in a relaxed setting.  Aim to eat 5-6 small meals per day. Do not skip meals.  Drink enough fluid to keep your urine pale yellow.  Ask your health care provider if you should take an over-the-counter probiotic to help restore healthy bacteria in your gut (digestive tract). ? Probiotics are foods that contain good bacteria and yeasts.  Your dietitian may have specific dietary recommendations for you based on your symptoms. He or she may recommend that you: ? Avoid foods that cause symptoms. Talk with your dietitian about other ways to get the same nutrients that are in those problem foods. ? Avoid foods with gluten. Gluten is a protein that is found in rye, wheat, and barley. ? Eat more foods that contain soluble fiber. Examples of foods with high soluble fiber include oats, seeds, and certain fruits and vegetables. Take a fiber supplement if directed by your dietitian. ? Reduce or avoid certain foods called FODMAPs. These are foods that contain carbohydrates that are hard to digest. Ask your doctor which foods contain these carbohydrates. What foods are not recommended? The following are some  foods and drinks that may make your symptoms worse:  Fatty foods, such as french fries.  Foods that contain gluten, such as pasta and cereal.  Dairy products, such as milk, cheese, and ice cream.  Chocolate.  Alcohol.  Products with caffeine, such as coffee.  Carbonated drinks, such as soda.  Foods that are high in FODMAPs. These include certain fruits and vegetables.  Products with sweeteners such as honey, high fructose corn syrup, sorbitol, and mannitol. The items listed above may not be a complete list of foods and beverages you should avoid. Contact a dietitian for more information. What foods are good sources of fiber? Your health care provider or dietitian may recommend that you eat more foods that contain fiber. Fiber can help to reduce constipation and other IBS symptoms. Add foods with fiber to your diet a little at a time so your body can get used to them. Too much fiber at one time might cause gas and swelling of your abdomen. The following are some foods that are good sources of fiber:  Berries, such as raspberries, strawberries, and blueberries.  Tomatoes.  Carrots.  Brown rice.  Oats.  Seeds, such as chia and pumpkin seeds. The items listed above may not be a complete list of recommended sources of fiber. Contact your dietitian for more options. Where to find more information  International Foundation for Functional Gastrointestinal Disorders: www.iffgd.org  National Institute of Diabetes and Digestive and Kidney Diseases: www.niddk.nih.gov Summary  When you have irritable bowel syndrome (IBS), it is   very important to eat the foods and follow the eating habits that are best for your condition.  IBS may cause various symptoms such as pain in the abdomen, constipation, or diarrhea.  Choosing the right foods can help to ease the discomfort that comes from symptoms.  Keep a food diary. This will help you identify foods that cause symptoms.  Your health  care provider or diet and nutrition specialist (dietitian) may recommend that you eat more foods that contain fiber. This information is not intended to replace advice given to you by your health care provider. Make sure you discuss any questions you have with your health care provider. Document Released: 09/11/2003 Document Revised: 10/11/2018 Document Reviewed: 02/22/2017 Elsevier Patient Education  2020 [employer].

## 2019-06-05 NOTE — Assessment & Plan Note (Addendum)
Encouraged Fodmap diet Can take Imodium OTC as needed for diarrhea Take Bentyl as needed for cramping Can take Mirilax as needed for constipation Will refer to GI for colonoscopy

## 2019-06-05 NOTE — Assessment & Plan Note (Signed)
Persistent issues Continue Preparation H OTC Referral to GI for colonoscopy

## 2019-06-05 NOTE — Assessment & Plan Note (Signed)
Will trial Omeprazole 40 mg PO daily 30 minutes prior to breakfast Referral to GI for possible upper endoscopy

## 2019-06-07 ENCOUNTER — Encounter: Payer: Self-pay | Admitting: Nurse Practitioner

## 2019-06-07 ENCOUNTER — Ambulatory Visit (INDEPENDENT_AMBULATORY_CARE_PROVIDER_SITE_OTHER): Payer: BC Managed Care – PPO | Admitting: Nurse Practitioner

## 2019-06-07 ENCOUNTER — Other Ambulatory Visit (INDEPENDENT_AMBULATORY_CARE_PROVIDER_SITE_OTHER): Payer: BC Managed Care – PPO

## 2019-06-07 VITALS — HR 87 | Temp 97.6°F | Ht 66.0 in | Wt 175.0 lb

## 2019-06-07 DIAGNOSIS — D649 Anemia, unspecified: Secondary | ICD-10-CM

## 2019-06-07 DIAGNOSIS — K648 Other hemorrhoids: Secondary | ICD-10-CM

## 2019-06-07 DIAGNOSIS — K589 Irritable bowel syndrome without diarrhea: Secondary | ICD-10-CM

## 2019-06-07 DIAGNOSIS — K219 Gastro-esophageal reflux disease without esophagitis: Secondary | ICD-10-CM | POA: Diagnosis not present

## 2019-06-07 LAB — IBC + FERRITIN
Ferritin: 65.2 ng/mL (ref 10.0–291.0)
Iron: 86 ug/dL (ref 42–145)
Saturation Ratios: 23.8 % (ref 20.0–50.0)
Transferrin: 258 mg/dL (ref 212.0–360.0)

## 2019-06-07 LAB — IGA: IgA: 179 mg/dL (ref 68–378)

## 2019-06-07 NOTE — Patient Instructions (Addendum)
If you are age 40 or older, your body mass index should be between 23-30. Your Body mass index is 28.25 kg/m. If this is out of the aforementioned range listed, please consider follow up with your Primary Care Provider.  If you are age 52 or younger, your body mass index should be between 19-25. Your Body mass index is 28.25 kg/m. If this is out of the aformentioned range listed, please consider follow up with your Primary Care Provider.   Your provider has requested that you go to the basement level for lab work before leaving today. Press "B" on the elevator. The lab is located at the first door on the left as you exit the elevator.  You have been given Low FODMAP diet and GERD literature handout.  Keep a food diary.  Decrease caffeine intake.  Follow up with me on 07/04/19 at 1:30 pm.  Thank you for choosing me and [facility].   Tye Savoy, NP

## 2019-06-07 NOTE — Progress Notes (Addendum)
ASSESSMENT / PLAN:   1. 38 yo female with longstanding IBS symptoms consisting of bloating, soft frequent stools with urgency alternating with constipation. Main complaint is that of bloating.  --Patient will keep a food diary.  She will eliminate certain items one at a time.  She does consume artificial sweeteners, keto syrups, avocados and other culprit foods.  Copy of low FODMAP diet given.  --Obtain TTG, IgA to evaluate for celiac disease --Follow-up with me in 4 weeks  2.  GERD with heartburn and regurgitation, new.  Omeprazole every a.m. is helping --Discussed antireflux measures.  She will check into wedge pillow, decrease caffeine consumption  3. Mild [place] anemia, possibly related to menses --Check iron studies --Await TTG, IgA  4.  Internal hemorrhoids with occasional minor bleeding.  History of hemorrhoidectomy in her 48s. Strains when constipated. She periodically uses Preparation H --Hemorrhoids only mildly inflamed on anoscopy today.  If they become more problematic for her then consider ligation.  Brochure given. --Can use Glycerin supp as needed to avoid straining.  HPI:    Referring Provider:     Webb Silversmith, NP  Reason for referral:    IBS  Chief Complaint:   IBS  Hubert is a 38 year old female from [place] who gives a several year history of IBS symptoms consisting of altered bowel habits and bloating.  She describes bowel pattern as typically having soft stools 3-4 times a day most days of the week. Has frequent postprandial urgency, sometimes has explosive diarrhea.  Remaining days of the week she feels constipated.  Describes constipation as having the urge to defecate but only passing one small piece of stool and that is associated with straining which aggravates her hemorrhoids.  Dicyclomine causes constipation.  She had a hemorrhoidectomy in her early 45s.  On occasion she has minor hemorrhoidal bleeding with wiping.  Patient's  main complaint is that of bloating which has gotten worse lately.  She has found a correlation between soy and diarrhea with bloating.  She has been consuming keto syrups and also uses artificial sweeteners.  She eats a lot of avocado.  Takes probiotic at night called "Bye Bye Bloating" which seems to help  Addition to above patient has recently developed GERD symptoms with heartburn and regurgitation.  PCP started her on omeprazole Monday which has helped.  She is taking the omeprazole every morning.  Labs revealed mild normocytic anemia.  Patient gives a history of chronic anemia.  She has regular menstrual cycles, flow varies between heavy and normal.  No significant NSAID use   Data Reviewed:  03/05/19 CMET - ok hgb 11.6, MCV 91  Past Medical History:  Diagnosis Date   Allergy    Chicken pox    Frequent headaches    IBS (irritable bowel syndrome)     Past Surgical History:  Procedure Laterality Date   GANGLION CYST EXCISION Left 01/08/2016   Procedure: EXCISION DORSAL GANGLION LEFT WRIST;  Surgeon: Daryll Brod, MD;  Location: [facility];  Service: Orthopedics;  Laterality: Left;   HEMORROIDECTOMY  2004   TONSILLECTOMY  2010   WISDOM TOOTH EXTRACTION     Family History  Problem Relation Age of Onset   Arthritis Father    Hyperlipidemia Father    Hypertension Father    Arthritis Maternal Grandmother    Stroke Maternal Grandfather    Cancer Paternal Grandmother  Ovarian    Cancer Paternal Aunt        ovarian cancer   Social History   Tobacco Use   Smoking status: Never Smoker   Smokeless tobacco: Never Used  Substance Use Topics   Alcohol use: Yes    Alcohol/week: 0.0 standard drinks    Comment: rare   Drug use: No   Current Outpatient Medications  Medication Sig Dispense Refill   Galcanezumab-gnlm (EMGALITY) 120 MG/ML SOSY Inject 120 mg into the skin every 28 (twenty-eight) days.     JUNEL FE 1/20 1-20 MG-MCG tablet Take 1  tablet by mouth daily.  3   Multiple Vitamin (MULTI-VITAMINS) TABS Take by mouth.     omeprazole (PRILOSEC) 40 MG capsule Take 1 capsule (40 mg total) by mouth daily. 30 capsule 3   promethazine (PHENERGAN) 25 MG tablet TAKE 1 TABLET (25 MG TOTAL) BY MOUTH EVERY 6 (SIX) HOURS AS NEEDED FOR NAUSEA.     SUMAtriptan (IMITREX) 100 MG tablet Take 1 tablet by mouth. For migraine may repeat in 2hrs if not better Max 2/24hr  2   SUMAtriptan 6 MG/0.5ML SOAJ INJECT 0.5MLS SUBCUTANEOUSLY ONCE AS NEEDED FOR MIGRAINE. MAY REPEAT IN 1 HOUR IF NEEDED  3   traZODone (DESYREL) 50 MG tablet TAKE 0.5-1 TABLETS (25-50 MG TOTAL) BY MOUTH AT BEDTIME AS NEEDED FOR SLEEP. 90 tablet 2   No current facility-administered medications for this visit.    No Known Allergies   Review of Systems: Positive for headaches.  All other systems reviewed and negative except where noted in HPI.   Physical Exam:    Wt Readings from Last 3 Encounters:  06/07/19 175 lb (79.4 kg)  06/05/19 176 lb (79.8 kg)  03/05/19 174 lb (78.9 kg)    Pulse 87    Temp 97.6 F (36.4 C)    Ht 5\' 6"  (1.676 m)    Wt 175 lb (79.4 kg)    LMP 05/21/2019    BMI 28.25 kg/m  Constitutional:  Pleasant female in no acute distress. Psychiatric: Normal mood and affect. Behavior is normal. EENT: Pupils normal.  Conjunctivae are normal. No scleral icterus. Neck supple.  Cardiovascular: Normal rate, regular rhythm. No edema Pulmonary/chest: Effort normal and breath sounds normal. No wheezing, rales or rhonchi. Abdominal: Soft, nondistended, nontender. Bowel sounds active throughout. There are no masses palpable. No hepatomegaly. Rectal : Perianal hemorrhoid tags.  On anoscopy there were mildly inflamed internal hemorrhoids and soft light brown stool Neurological: Alert and oriented to person place and time. Skin: Skin is warm and dry. No rashes noted.  Tye Savoy, NP  06/07/2019, 9:37 AM  Cc: Jearld Fenton, NP

## 2019-06-07 NOTE — Progress Notes (Signed)
Agree with the assessment and plan as outlined by Paula Guenther, NP. ° °Dynasti Kerman, DO, FACG ° °

## 2019-06-08 LAB — TISSUE TRANSGLUTAMINASE, IGA: (tTG) Ab, IgA: 1 U/mL

## 2019-07-04 ENCOUNTER — Other Ambulatory Visit: Payer: Self-pay

## 2019-07-04 ENCOUNTER — Encounter: Payer: Self-pay | Admitting: Nurse Practitioner

## 2019-07-04 ENCOUNTER — Ambulatory Visit (INDEPENDENT_AMBULATORY_CARE_PROVIDER_SITE_OTHER): Payer: BC Managed Care – PPO | Admitting: Nurse Practitioner

## 2019-07-04 VITALS — BP 100/60 | HR 79 | Temp 98.4°F | Ht 66.0 in | Wt 177.0 lb

## 2019-07-04 DIAGNOSIS — K219 Gastro-esophageal reflux disease without esophagitis: Secondary | ICD-10-CM

## 2019-07-04 DIAGNOSIS — R194 Change in bowel habit: Secondary | ICD-10-CM

## 2019-07-04 NOTE — Progress Notes (Signed)
Chief Complaint:   Follow up   IMPRESSION and PLAN:    1.  38 yo female here for follow up on longstanding IBS symptoms consisting of bloating,nausea, altered bowel habits, bloating, GERD and hemorrhoids. Except for bowel movements her symptoms have resolved ---TTG, IgA negative ---She is keeping a food diary and found soy to be a culprit.  Dining out / fried foods also culprits. Continue food diary to help identify other culprits. ---She also found that cramping / abdominal distress occurred on busy mornings like when trying to get kids ready for school.  --Bowel movements improved after drinking more water,  exercising at gym and eating more vegetables.  ---She inquires about continuing / stopping "bye-bye bloat".  It is reasonable to discontinue the supplement and see if symptoms recur.  ---Hemorrhoids no longer problematic, no further bleeding --Follow up prn for any recurrent symptoms   2. GERD with heartburn and regurgitation, resolved with initiation of PPI a month ago. --Reiterated antireflux measures.  --Will try changing her from a PPI to H2 blocker.  3. Chronic mild chronic [place] anemia. Probably menses related but she is not iron deficient by labs.     4.  Internal hemorrhoids with occasional minor bleeding.      HPI:     Patient is a 38 year old female who I saw earlier this month for evaluation of a several week history of altered bowel habits, cramping, bloating, GERD, and hemorrhoids.  She had already been started on omeprazole and GERD symptoms were improving.  She had been taking a probiotic at night called "Bye-Bye Bloat" which was also helping.  I asked her to keep a food diary to identify culprit foods.  Obtained labs for celiac disease which were normal.  We discussed antireflux measures, continued omeprazole.  On exam she had mildly inflamed hemorrhoids and was periodically using Preparation H.  Recommend glycerin suppositories to avoid straining, information  given on ligation. She is here for follow up  Catlyn is doing much better.  She has not had any further bloating or nausea, still taking "Bye-Bye Bloat".  She was able to identify some culprit foods with the food diary.  Also noticed that stressful mornings contributed to cramping and abdominal distress. No GERD symptoms on Omeprazole. BMs better with hydration and working out at [employer]. She hasn't had any further hemorrhoidal bleeding    Review of systems:     No chest pain, no SOB, no fevers, no urinary sx   Past Medical History:  Diagnosis Date  . Allergy   . Chicken pox   . Frequent headaches   . IBS (irritable bowel syndrome)     Patient's surgical history, family medical history, social history, medications and allergies were all reviewed in Epic    Current Outpatient Medications  Medication Sig Dispense Refill  . Galcanezumab-gnlm (EMGALITY) 120 MG/ML SOSY Inject 120 mg into the skin every 28 (twenty-eight) days.    Lenda Kelp FE 1/20 1-20 MG-MCG tablet Take 1 tablet by mouth daily.  3  . Multiple Vitamin (MULTI-VITAMINS) TABS Take by mouth.    Marland Kitchen omeprazole (PRILOSEC) 40 MG capsule Take 1 capsule (40 mg total) by mouth daily. 30 capsule 3  . promethazine (PHENERGAN) 25 MG tablet TAKE 1 TABLET (25 MG TOTAL) BY MOUTH EVERY 6 (SIX) HOURS AS NEEDED FOR NAUSEA.    . SUMAtriptan (IMITREX) 100 MG tablet Take 1 tablet by mouth. For migraine may repeat in 2hrs if not better  Max 2/24hr  2  . SUMAtriptan 6 MG/0.5ML SOAJ INJECT 0.5MLS SUBCUTANEOUSLY ONCE AS NEEDED FOR MIGRAINE. MAY REPEAT IN 1 HOUR IF NEEDED  3  . traZODone (DESYREL) 50 MG tablet TAKE 0.5-1 TABLETS (25-50 MG TOTAL) BY MOUTH AT BEDTIME AS NEEDED FOR SLEEP. 90 tablet 2   No current facility-administered medications for this visit.    Physical Exam:     BP 100/60   Pulse 79   Temp 98.4 F (36.9 C)   Ht 5\' 6"  (1.676 m)   Wt 177 lb (80.3 kg)   BMI 28.57 kg/m   GENERAL:  Pleasant female in NAD PSYCH: : Cooperative,  normal affect CARDIAC:  RRR,  PULM: Normal respiratory effort ABDOMEN:  Nondistended, soft, nontender. No obvious masses, no hepatomegaly,  normal bowel sounds NEURO: Alert and oriented x 3, no focal neurologic deficits   Tye Savoy , NP 07/04/2019, 1:48 PM

## 2019-07-04 NOTE — Patient Instructions (Addendum)
If you are age 40 or older, your body mass index should be between 23-30. Your Body mass index is 28.57 kg/m. If this is out of the aforementioned range listed, please consider follow up with your Primary Care Provider.  If you are age 4 or younger, your body mass index should be between 19-25. Your Body mass index is 28.57 kg/m. If this is out of the aformentioned range listed, please consider follow up with your Primary Care Provider.   Discontinue Omeprazole.  Start Pepcid 20 mg - take one every morning.  (this is over-the-counter)  You have been GERD literature.  Follow up as needed.  Thank you for choosing me and [facility].   Tye Savoy, NP

## 2019-07-05 ENCOUNTER — Encounter: Payer: Self-pay | Admitting: Nurse Practitioner

## 2019-07-09 NOTE — Progress Notes (Signed)
Agree with the assessment and plan as outlined by Paula Guenther, NP. ° °Azaylah Stailey, DO, FACG ° °

## 2019-08-27 ENCOUNTER — Other Ambulatory Visit: Payer: Self-pay | Admitting: Internal Medicine

## 2019-10-01 ENCOUNTER — Encounter: Payer: Self-pay | Admitting: Internal Medicine

## 2019-10-01 ENCOUNTER — Ambulatory Visit: Payer: BC Managed Care – PPO | Admitting: Internal Medicine

## 2019-10-01 ENCOUNTER — Other Ambulatory Visit: Payer: Self-pay

## 2019-10-01 VITALS — BP 106/74 | HR 87 | Temp 98.1°F | Wt 169.0 lb

## 2019-10-01 DIAGNOSIS — N939 Abnormal uterine and vaginal bleeding, unspecified: Secondary | ICD-10-CM | POA: Diagnosis not present

## 2019-10-01 DIAGNOSIS — R39198 Other difficulties with micturition: Secondary | ICD-10-CM

## 2019-10-01 DIAGNOSIS — R1031 Right lower quadrant pain: Secondary | ICD-10-CM

## 2019-10-01 LAB — POC URINALSYSI DIPSTICK (AUTOMATED)
Bilirubin, UA: NEGATIVE
Blood, UA: NEGATIVE
Glucose, UA: NEGATIVE
Ketones, UA: NEGATIVE
Leukocytes, UA: NEGATIVE
Nitrite, UA: NEGATIVE
Protein, UA: NEGATIVE
Spec Grav, UA: 1.025 (ref 1.010–1.025)
Urobilinogen, UA: 0.2 E.U./dL
pH, UA: 6 (ref 5.0–8.0)

## 2019-10-01 NOTE — Progress Notes (Signed)
Subjective:    Patient ID: Robin West, female    DOB: 10-11-1980, 39 y.o.   MRN: [id number]  HPI  Pt presents to the clinic today with c/o intermittent RLQ abdominal pain. This started a few months ago. She describes the pain as soreness. The pain is worse after intercourse. She reports unsteady urine stream and not sure if this is related or not. She has had some vaginal spotting which happens intermittently in between her periods. She is on OCP's. Her LMP was 09/12/19. Her last pap smear was 02/2019- normal. She reports alternating constipation and diarrhea, but this is chronic and not worse. She denies back pain. She has tried AZO OTC but seemed to make her symptoms worse.  Review of Systems      Past Medical History:  Diagnosis Date  . Allergy   . Anemia   . Chicken pox   . Frequent headaches   . IBS (irritable bowel syndrome)     Current Outpatient Medications  Medication Sig Dispense Refill  . Galcanezumab-gnlm (EMGALITY) 120 MG/ML SOSY Inject 120 mg into the skin every 28 (twenty-eight) days.    Lenda Kelp FE 1/20 1-20 MG-MCG tablet Take 1 tablet by mouth daily.  3  . Multiple Vitamin (MULTI-VITAMINS) TABS Take by mouth.    Marland Kitchen omeprazole (PRILOSEC) 40 MG capsule TAKE 1 CAPSULE BY MOUTH EVERY DAY 90 capsule 1  . OVER THE COUNTER MEDICATION Sambucus Elderberry 500mg , zinc 5mg  and Vit C 90mg  - one daily    . Probiotic Product (PROBIOTIC PO) Take by mouth. BYE BYE Bloat- one daily    . promethazine (PHENERGAN) 25 MG tablet TAKE 1 TABLET (25 MG TOTAL) BY MOUTH EVERY 6 (SIX) HOURS AS NEEDED FOR NAUSEA.    . SUMAtriptan (IMITREX) 100 MG tablet Take 1 tablet by mouth. For migraine may repeat in 2hrs if not better Max 2/24hr  2  . SUMAtriptan 6 MG/0.5ML SOAJ INJECT 0.5MLS SUBCUTANEOUSLY ONCE AS NEEDED FOR MIGRAINE. MAY REPEAT IN 1 HOUR IF NEEDED  3  . traZODone (DESYREL) 50 MG tablet TAKE 0.5-1 TABLETS (25-50 MG TOTAL) BY MOUTH AT BEDTIME AS NEEDED FOR SLEEP. 90 tablet 2   No current  facility-administered medications for this visit.    No Known Allergies  Family History  Problem Relation Age of Onset  . Arthritis Father   . Hyperlipidemia Father   . Hypertension Father   . Arthritis Maternal Grandmother   . Stroke Maternal Grandfather   . Cancer Paternal Grandmother        Ovarian   . Cancer Paternal Aunt        ovarian cancer    Social History   Socioeconomic History  . Marital status: Married    Spouse name: Not on file  . Number of children: 2  . Years of education: Not on file  . Highest education level: Not on file  Occupational History  . Occupation: Press photographer Rep  Tobacco Use  . Smoking status: Never Smoker  . Smokeless tobacco: Never Used  Substance and Sexual Activity  . Alcohol use: Yes    Alcohol/week: 0.0 standard drinks    Comment: rare  . Drug use: No  . Sexual activity: Yes    Partners: Male    Birth control/protection: Pill  Other Topics Concern  . Not on file  Social History Narrative  . Not on file   Social Determinants of Health   Financial Resource Strain:   . Difficulty of Paying Living Expenses:  Food Insecurity:   . Worried About Charity fundraiser in the Last Year:   . Arboriculturist in the Last Year:   Transportation Needs:   . Film/video editor (Medical):   Marland Kitchen Lack of Transportation (Non-Medical):   Physical Activity:   . Days of Exercise per Week:   . Minutes of Exercise per Session:   Stress:   . Feeling of Stress :   Social Connections:   . Frequency of Communication with Friends and Family:   . Frequency of Social Gatherings with Friends and Family:   . Attends Religious Services:   . Active Member of Clubs or Organizations:   . Attends Archivist Meetings:   Marland Kitchen Marital Status:   Intimate Partner Violence:   . Fear of Current or Ex-Partner:   . Emotionally Abused:   Marland Kitchen Physically Abused:   . Sexually Abused:      Constitutional: Denies fever, malaise, fatigue, headache or abrupt  weight changes.  Respiratory: Denies difficulty breathing, shortness of breath, cough or sputum production.   Cardiovascular: Denies chest pain, chest tightness, palpitations or swelling in the hands or feet.  Gastrointestinal: Pt reports abdominal pain, alternating constipation and diarrhea. Denies bloating, or blood in the stool.  GU: Pt reports unsteady urine stream, spotting in between periods. Denies urgency, frequency, pain with urination, burning sensation, blood in urine, odor or discharge. Musculoskeletal: Denies decrease in range of motion, difficulty with gait, muscle pain or joint pain and swelling.  Skin: Denies redness, rashes, lesions or ulcercations.   No other specific complaints in a complete review of systems (except as listed in HPI above).  Objective:   Physical Exam  BP 106/74   Pulse 87   Temp 98.1 F (36.7 C) (Temporal)   Wt 169 lb (76.7 kg)   LMP 09/10/2019   SpO2 100%   BMI 27.28 kg/m   Wt Readings from Last 3 Encounters:  07/04/19 177 lb (80.3 kg)  06/07/19 175 lb (79.4 kg)  06/05/19 176 lb (79.8 kg)    General: Appears her stated age, well developed, well nourished in NAD. Skin: Warm, dry and intact. No rashes noted. Cardiovascular: Normal rate and rhythm. S1,S2 noted.  No murmur, rubs or gallops noted.  Pulmonary/Chest: Normal effort and positive vesicular breath sounds. No respiratory distress. No wheezes, rales or ronchi noted.  Abdomen: Soft and nontender in the RLQ (more pelvic, not McBurney's point). Normal bowel sounds. No distention or masses noted. No CVA tenderness noted. Musculoskeletal: No bony tenderness noted over the spine.  No difficulty with gait.  Neurological: Alert and oriented.   BMET    Component Value Date/Time   NA 137 03/05/2019 0851   K 3.8 03/05/2019 0851   CL 105 03/05/2019 0851   CO2 25 03/05/2019 0851   GLUCOSE 88 03/05/2019 0851   BUN 23 03/05/2019 0851   CREATININE 0.53 03/05/2019 0851   CALCIUM 8.8 03/05/2019  0851   GFRNONAA >60 02/29/2016 1902   GFRAA >60 02/29/2016 1902    Lipid Panel     Component Value Date/Time   CHOL 178 03/05/2019 0851   TRIG 70.0 03/05/2019 0851   HDL 52.30 03/05/2019 0851   CHOLHDL 3 03/05/2019 0851   VLDL 14.0 03/05/2019 0851   LDLCALC 111 (H) 03/05/2019 0851    CBC    Component Value Date/Time   WBC 7.8 03/05/2019 0851   RBC 3.76 (L) 03/05/2019 0851   HGB 11.6 (L) 03/05/2019 0851   HCT  34.4 (L) 03/05/2019 0851   PLT 334.0 03/05/2019 0851   MCV 91.4 03/05/2019 0851   MCH 31.0 02/29/2016 1902   MCHC 33.6 03/05/2019 0851   RDW 12.6 03/05/2019 0851    Hgb A1C Lab Results  Component Value Date   HGBA1C 5.5 05/29/2018            Assessment & Plan:   RLQ Abdominal Pain, Unsteady Urine Stream, Vaginal Spotting:  Urinalysis: normal Concern for ovarian cyst Will obtain pelvic/transvaginal ultrasound for further evaluation Continue OCP's for now Can try Ibuprofen and a heating pain to help reduce pain when it occurs  Will follow up after ultrasound, return precautions disussed  Webb Silversmith, NP This visit occurred during the SARS-CoV-2 public health emergency.  Safety protocols were in place, including screening questions prior to the visit, additional usage of staff PPE, and extensive cleaning of exam room while observing appropriate contact time as indicated for disinfecting solutions.

## 2019-10-01 NOTE — Patient Instructions (Signed)
Pelvic Pain, Female Pelvic pain is pain in your lower belly (abdomen), below your belly button and between your hips. The pain may start suddenly (be acute), keep coming back (be recurring), or last a long time (become chronic). Pelvic pain that lasts longer than 6 months is called chronic pelvic pain. There are many causes of pelvic pain. Sometimes the cause of pelvic pain is not known. Follow these instructions at home:   Take over-the-counter and prescription medicines only as told by your doctor.  Rest as told by your doctor.  Do not have sex if it hurts.  Keep a journal of your pelvic pain. Write down: ? When the pain started. ? Where the pain is located. ? What seems to make the pain better or worse, such as food or your period (menstrual cycle). ? Any symptoms you have along with the pain.  Keep all follow-up visits as told by your doctor. This is important. Contact a doctor if:  Medicine does not help your pain.  Your pain comes back.  You have new symptoms.  You have unusual discharge or bleeding from your vagina.  You have a fever or chills.  You are having trouble pooping (constipation).  You have blood in your pee (urine) or poop (stool).  Your pee smells bad.  You feel weak or light-headed. Get help right away if:  You have sudden pain that is very bad.  Your pain keeps getting worse.  You have very bad pain and also have any of these symptoms: ? A fever. ? Feeling sick to your stomach (nausea). ? Throwing up (vomiting). ? Being very sweaty.  You pass out (lose consciousness). Summary  Pelvic pain is pain in your lower belly (abdomen), below your belly button and between your hips.  There are many possible causes of pelvic pain.  Keep a journal of your pelvic pain. This information is not intended to replace advice given to you by your health care provider. Make sure you discuss any questions you have with your health care provider. Document  Revised: 12/07/2017 Document Reviewed: 12/07/2017 Elsevier Patient Education  2020 [employer] Inc[employer]  

## 2019-10-04 ENCOUNTER — Encounter: Payer: Self-pay | Admitting: Internal Medicine

## 2019-10-04 ENCOUNTER — Other Ambulatory Visit: Payer: Self-pay

## 2019-10-04 ENCOUNTER — Ambulatory Visit
Admission: RE | Admit: 2019-10-04 | Discharge: 2019-10-04 | Disposition: A | Discharge status: Home / Self Care | Payer: BC Managed Care – PPO | Source: Ambulatory Visit | Attending: Internal Medicine | Admitting: Internal Medicine

## 2019-10-04 DIAGNOSIS — D252 Subserosal leiomyoma of uterus: Secondary | ICD-10-CM | POA: Diagnosis not present

## 2019-10-04 DIAGNOSIS — R1031 Right lower quadrant pain: Secondary | ICD-10-CM | POA: Diagnosis not present

## 2019-12-05 DIAGNOSIS — Z20828 Contact with and (suspected) exposure to other viral communicable diseases: Secondary | ICD-10-CM | POA: Diagnosis not present

## 2020-01-14 ENCOUNTER — Other Ambulatory Visit: Payer: Self-pay

## 2020-01-14 ENCOUNTER — Ambulatory Visit: Payer: BC Managed Care – PPO | Admitting: Dermatology

## 2020-01-16 ENCOUNTER — Encounter: Payer: BC Managed Care – PPO | Admitting: Dermatology

## 2020-01-31 ENCOUNTER — Other Ambulatory Visit: Payer: Self-pay

## 2020-01-31 ENCOUNTER — Ambulatory Visit: Payer: BC Managed Care – PPO | Admitting: Dermatology

## 2020-01-31 DIAGNOSIS — Z86018 Personal history of other benign neoplasm: Secondary | ICD-10-CM

## 2020-01-31 DIAGNOSIS — L578 Other skin changes due to chronic exposure to nonionizing radiation: Secondary | ICD-10-CM | POA: Diagnosis not present

## 2020-01-31 DIAGNOSIS — L820 Inflamed seborrheic keratosis: Secondary | ICD-10-CM

## 2020-01-31 DIAGNOSIS — L814 Other melanin hyperpigmentation: Secondary | ICD-10-CM

## 2020-01-31 DIAGNOSIS — D485 Neoplasm of uncertain behavior of skin: Secondary | ICD-10-CM

## 2020-01-31 DIAGNOSIS — D229 Melanocytic nevi, unspecified: Secondary | ICD-10-CM | POA: Diagnosis not present

## 2020-01-31 DIAGNOSIS — Z1283 Encounter for screening for malignant neoplasm of skin: Secondary | ICD-10-CM | POA: Diagnosis not present

## 2020-01-31 HISTORY — DX: Personal history of other benign neoplasm: Z86.018

## 2020-01-31 NOTE — Patient Instructions (Addendum)
Cryotherapy Aftercare  . Wash gently with soap and water everyday.   . Apply Vaseline and Band-Aid daily until healed.  Melanoma ABCDEs  Melanoma is the most dangerous type of skin cancer, and is the leading cause of death from skin disease.  You are more likely to develop melanoma if you:  Have light-colored skin, light-colored eyes, or red or blond hair  Spend a lot of time in the sun  Tan regularly, either outdoors or in a tanning bed  Have had blistering sunburns, especially during childhood  Have a close family member who has had a melanoma  Have atypical moles or large birthmarks  Early detection of melanoma is key since treatment is typically straightforward and cure rates are extremely high if we catch it early.   The first sign of melanoma is often a change in a mole or a new dark spot.  The ABCDE system is a way of remembering the signs of melanoma.  A for asymmetry:  The two halves do not match. B for border:  The edges of the growth are irregular. C for color:  A mixture of colors are present instead of an even brown color. D for diameter:  Melanomas are usually (but not always) greater than 6mm - the size of a pencil eraser. E for evolution:  The spot keeps changing in size, shape, and color.  Please check your skin once per month between visits. You can use a small mirror in front and a large mirror behind you to keep an eye on the back side or your body.   If you see any new or changing lesions before your next follow-up, please call to schedule a visit.  Please continue daily skin protection including broad spectrum sunscreen SPF 30+ to sun-exposed areas, reapplying every 2 hours as needed when you're outdoors.   Wound Care Instructions  1. Cleanse wound gently with soap and water once a day then pat dry with clean gauze. Apply a thing coat of Petrolatum (petroleum jelly, "Vaseline") over the wound (unless you have an allergy to this). We recommend that you use  a new, sterile tube of Vaseline. Do not pick or remove scabs. Do not remove the yellow or white "healing tissue" from the base of the wound.  2. Cover the wound with fresh, clean, nonstick gauze and secure with paper tape. You may use Band-Aids in place of gauze and tape if the would is small enough, but would recommend trimming much of the tape off as there is often too much. Sometimes Band-Aids can irritate the skin.  3. You should call the office for your biopsy report after 1 week if you have not already been contacted.  4. If you experience any problems, such as abnormal amounts of bleeding, swelling, significant bruising, significant pain, or evidence of infection, please call the office immediately.  5. FOR ADULT SURGERY PATIENTS: If you need something for pain relief you may take 1 extra strength Tylenol (acetaminophen) AND 2 Ibuprofen (200mg each) together every 4 hours as needed for pain. (do not take these if you are allergic to them or if you have a reason you should not take them.) Typically, you may only need pain medication for 1 to 3 days.     

## 2020-01-31 NOTE — Progress Notes (Signed)
Follow-Up Visit   Subjective  Robin West is a 39 y.o. female who presents for the following: Annual Exam. The patient presents for Total-Body Skin Exam (TBSE) for skin cancer screening and mole check. Patient here today for TBSE. She does have a history of dysplastic nevi. There is a spot on her back that itches, present for years and she would like it removed.   The following portions of the chart were reviewed this encounter and updated as appropriate:  Tobacco  Allergies  Meds  Problems  Med Hx  Surg Hx  Fam Hx     Review of Systems:  No other skin or systemic complaints except as noted in HPI or Assessment and Plan.  Objective  Well appearing patient in no apparent distress; mood and affect are within normal limits.  A full examination was performed including scalp, head, eyes, ears, nose, lips, neck, chest, axillae, abdomen, back, buttocks, bilateral upper extremities, bilateral lower extremities, hands, feet, fingers, toes, fingernails, and toenails. All findings within normal limits unless otherwise noted below.  Objective  Mid Back x 2 (2): Erythematous keratotic or waxy stuck-on papule or plaque.   Objective  Left mid buttocks: 0.4cm irregular brown macule   Assessment & Plan    Inflamed seborrheic keratosis (2) Mid Back x 2  Destruction of lesion - Mid Back x 2 Complexity: simple   Destruction method: cryotherapy   Informed consent: discussed and consent obtained   Timeout:  patient name, date of birth, surgical site, and procedure verified Lesion destroyed using liquid nitrogen: Yes   Region frozen until ice ball extended beyond lesion: Yes   Outcome: patient tolerated procedure well with no complications   Post-procedure details: wound care instructions given    Neoplasm of uncertain behavior of skin Left mid buttocks  Skin / nail biopsy Type of biopsy: tangential   Informed consent: discussed and consent obtained   Timeout: patient name, date  of birth, surgical site, and procedure verified   Procedure prep:  Patient was prepped and draped in usual sterile fashion Prep type:  Isopropyl alcohol Anesthesia: the lesion was anesthetized in a standard fashion   Anesthetic:  1% lidocaine w/ epinephrine 1-100,000 buffered w/ 8.4% NaHCO3 Instrument used: flexible razor blade   Hemostasis achieved with: pressure, aluminum chloride and electrodesiccation   Outcome: patient tolerated procedure well   Post-procedure details: sterile dressing applied and wound care instructions given   Dressing type: petrolatum and bandage    Specimen 1 - Surgical pathology Differential Diagnosis: Nevus vs Dysplastic Nevus Check Margins: No 0.4cm irregular brown macule  Lentigines - Scattered tan macules - Discussed due to sun exposure - Benign, observe - Call for any changes  Melanocytic Nevi - Tan-brown and/or pink-flesh-colored symmetric macules and papules, some slightly irregular - Benign appearing on exam today - Observation - Call clinic for new or changing moles - Recommend daily use of broad spectrum spf 30+ sunscreen to sun-exposed areas.   Actinic Damage - diffuse scaly erythematous macules with underlying dyspigmentation - Recommend daily broad spectrum sunscreen SPF 30+ to sun-exposed areas, reapply every 2 hours as needed.  - Call for new or changing lesions.  Skin cancer screening performed today.  History of Dysplastic Nevi - No evidence of recurrence today - Recommend regular full body skin exams - Recommend daily broad spectrum sunscreen SPF 30+ to sun-exposed areas, reapply every 2 hours as needed.  - Call if any new or changing lesions are noted between office visits  Return  in about 1 year (around 01/30/2021) for TBSE with hx DN.  Graciella Belton, RMA, am acting as scribe for Sarina Ser, MD . Documentation: I have reviewed the above documentation for accuracy and completeness, and I agree with the above.  Sarina Ser, MD

## 2020-02-01 ENCOUNTER — Encounter: Payer: Self-pay | Admitting: Internal Medicine

## 2020-02-01 ENCOUNTER — Ambulatory Visit: Payer: BC Managed Care – PPO | Admitting: Internal Medicine

## 2020-02-01 VITALS — BP 108/78 | HR 88 | Temp 98.2°F | Wt 166.0 lb

## 2020-02-01 DIAGNOSIS — R1011 Right upper quadrant pain: Secondary | ICD-10-CM

## 2020-02-01 DIAGNOSIS — M546 Pain in thoracic spine: Secondary | ICD-10-CM | POA: Diagnosis not present

## 2020-02-01 DIAGNOSIS — F419 Anxiety disorder, unspecified: Secondary | ICD-10-CM | POA: Diagnosis not present

## 2020-02-01 DIAGNOSIS — R0789 Other chest pain: Secondary | ICD-10-CM | POA: Diagnosis not present

## 2020-02-01 DIAGNOSIS — R002 Palpitations: Secondary | ICD-10-CM

## 2020-02-01 MED ORDER — HYDROXYZINE HCL 10 MG PO TABS: 10.0000 mg | ORAL_TABLET | Freq: Every day | ORAL | 0 refills | Status: DC | PRN

## 2020-02-01 NOTE — Progress Notes (Signed)
Subjective:    Patient ID: Robin West, female    DOB: 1980/09/16, 39 y.o.   MRN: [id number]  HPI  Patient presents to the clinic today with complaint of anxiety. She noticed this 1 month ago. She reports these symptoms come and go, she has no idea what is triggering this. She reports increased heart rate, chest heaviness that resolves after a few minutes. She reports associated RUQ pain that radiates around to her right upper back. She denies nausea, vomiting or blood in her stool. She does have IBS so has issues with intermittent constipation and diarrhea. Her abdominal pain is not related to food. She denies dizziness, vision changes, syncopal episodes, chest pain or chest tightness. She has been under a lot more stress lately. She has been seeing a therapist over the last month which she does feel like has helped some. She has taken medication for anxiety in the past, but did not take this for very long. She has never taken medication for for panic attacks in past.   Review of Systems      Past Medical History:  Diagnosis Date  . Allergy   . Anemia   . Chicken pox   . Dysplastic nevus 02/09/2017   Mid back spinal at bra line. Mild atypia, lateral and deep margins involved.  Marland Kitchen Dysplastic nevus 02/09/2017   Right proximal post. lat. thigh. Mild atypia, close to margin.  . Frequent headaches   . IBS (irritable bowel syndrome)     Current Outpatient Medications  Medication Sig Dispense Refill  . Galcanezumab-gnlm (EMGALITY) 120 MG/ML SOSY Inject 120 mg into the skin every 28 (twenty-eight) days.    Lenda Kelp FE 1/20 1-20 MG-MCG tablet Take 1 tablet by mouth daily.  3  . Multiple Vitamin (MULTI-VITAMINS) TABS Take by mouth.    Marland Kitchen omeprazole (PRILOSEC) 40 MG capsule TAKE 1 CAPSULE BY MOUTH EVERY DAY 90 capsule 1  . OVER THE COUNTER MEDICATION Sambucus Elderberry 500mg , zinc 5mg  and Vit C 90mg  - one daily    . Probiotic Product (PROBIOTIC PO) Take by mouth. BYE BYE Bloat- one daily      . promethazine (PHENERGAN) 25 MG tablet TAKE 1 TABLET (25 MG TOTAL) BY MOUTH EVERY 6 (SIX) HOURS AS NEEDED FOR NAUSEA.    . SUMAtriptan (IMITREX) 100 MG tablet Take 1 tablet by mouth. For migraine may repeat in 2hrs if not better Max 2/24hr  2  . SUMAtriptan 6 MG/0.5ML SOAJ INJECT 0.5MLS SUBCUTANEOUSLY ONCE AS NEEDED FOR MIGRAINE. MAY REPEAT IN 1 HOUR IF NEEDED  3  . traZODone (DESYREL) 50 MG tablet TAKE 0.5-1 TABLETS (25-50 MG TOTAL) BY MOUTH AT BEDTIME AS NEEDED FOR SLEEP. 90 tablet 2   No current facility-administered medications for this visit.    No Known Allergies  Family History  Problem Relation Age of Onset  . Arthritis Father   . Hyperlipidemia Father   . Hypertension Father   . Arthritis Maternal Grandmother   . Stroke Maternal Grandfather   . Cancer Paternal Grandmother        Ovarian   . Cancer Paternal Aunt        ovarian cancer    Social History   Socioeconomic History  . Marital status: Married    Spouse name: Not on file  . Number of children: 2  . Years of education: Not on file  . Highest education level: Not on file  Occupational History  . Occupation: Press photographer Rep  Tobacco Use  .  Smoking status: Never Smoker  . Smokeless tobacco: Never Used  Vaping Use  . Vaping Use: Never used  Substance and Sexual Activity  . Alcohol use: Yes    Alcohol/week: 0.0 standard drinks    Comment: rare  . Drug use: No  . Sexual activity: Yes    Partners: Male    Birth control/protection: Pill  Other Topics Concern  . Not on file  Social History Narrative  . Not on file   Social Determinants of Health   Financial Resource Strain:   . Difficulty of Paying Living Expenses:   Food Insecurity:   . Worried About Charity fundraiser in the Last Year:   . Arboriculturist in the Last Year:   Transportation Needs:   . Film/video editor (Medical):   Marland Kitchen Lack of Transportation (Non-Medical):   Physical Activity:   . Days of Exercise per Week:   . Minutes of  Exercise per Session:   Stress:   . Feeling of Stress :   Social Connections:   . Frequency of Communication with Friends and Family:   . Frequency of Social Gatherings with Friends and Family:   . Attends Religious Services:   . Active Member of Clubs or Organizations:   . Attends Archivist Meetings:   Marland Kitchen Marital Status:   Intimate Partner Violence:   . Fear of Current or Ex-Partner:   . Emotionally Abused:   Marland Kitchen Physically Abused:   . Sexually Abused:      Constitutional: Denies fever, malaise, fatigue, headache or abrupt weight changes.  Respiratory: Denies difficulty breathing, shortness of breath, cough or sputum production.   Cardiovascular: Pt reports palpitations, chest heaviness. Denies chest pain, chest tightness, or swelling in the hands or feet.  Gastrointestinal: Pt reports right side abdominal pain, constipation, diarrhea. Denies bloating, or blood in the stool.  GU: Pt denies urinary frequency, dysuria, blood in her urine, urgency, or vaginal issues. MSK: Pt reports right side back pain. Denies decrease in ROM, joint pain or swelling or difficulty with gait. Neurological: Denies dizziness, difficulty with memory, difficulty with speech or problems with balance and coordination.  Psych: Pt reports anxiety. Denies depression, SI/HI.  No other specific complaints in a complete review of systems (except as listed in HPI above).  Objective:   Physical Exam BP 108/78   Pulse 88   Temp 98.2 F (36.8 C) (Temporal)   Wt 166 lb (75.3 kg)   SpO2 98%   BMI 26.79 kg/m   Wt Readings from Last 3 Encounters:  10/01/19 169 lb (76.7 kg)  07/04/19 177 lb (80.3 kg)  06/07/19 175 lb (79.4 kg)    General: Appears her stated age, well developed, well nourished in NAD. Neck:  Neck supple, trachea midline. No masses, lumps or thyromegaly present.  Cardiovascular: Normal rate and rhythm. S1,S2 noted.  No murmur, rubs or gallops noted.  Pulmonary/Chest: Normal effort and  positive vesicular breath sounds. No respiratory distress. No wheezes, rales or ronchi noted.  Abdomen: She points to the RUQ as her site of pain. Mildly tender with palpation. MSK: She points to her Right mid back as her site of pain. No difficulty with gait. Neurological: Alert and oriented.  Psychiatric: Mildly anxious appearing, . Behavior is normal. Judgment and thought content normal.     BMET    Component Value Date/Time   NA 137 03/05/2019 0851   K 3.8 03/05/2019 0851   CL 105 03/05/2019 0851   CO2  25 03/05/2019 0851   GLUCOSE 88 03/05/2019 0851   BUN 23 03/05/2019 0851   CREATININE 0.53 03/05/2019 0851   CALCIUM 8.8 03/05/2019 0851   GFRNONAA >60 02/29/2016 1902   GFRAA >60 02/29/2016 1902    Lipid Panel     Component Value Date/Time   CHOL 178 03/05/2019 0851   TRIG 70.0 03/05/2019 0851   HDL 52.30 03/05/2019 0851   CHOLHDL 3 03/05/2019 0851   VLDL 14.0 03/05/2019 0851   LDLCALC 111 (H) 03/05/2019 0851    CBC    Component Value Date/Time   WBC 7.8 03/05/2019 0851   RBC 3.76 (L) 03/05/2019 0851   HGB 11.6 (L) 03/05/2019 0851   HCT 34.4 (L) 03/05/2019 0851   PLT 334.0 03/05/2019 0851   MCV 91.4 03/05/2019 0851   MCH 31.0 02/29/2016 1902   MCHC 33.6 03/05/2019 0851   RDW 12.6 03/05/2019 0851    Hgb A1C Lab Results  Component Value Date   HGBA1C 5.5 05/29/2018            Assessment & Plan:   Anxiety, Palpitations, Chest Heaviness:  GAD 7 completed Support offered today TSH today Offered ECG, but she declines and I do not think this is necessary RX for Hydroxyzine 10 mg daily prn- sedation caution given  RUQ Pain, Right Side Back Pain:  CMET today Consider RUQ [name] for further evaluation  Will follow up after labs, return precautions discussed Webb Silversmith, NP This visit occurred during the SARS-CoV-2 public health emergency.  Safety protocols were in place, including screening questions prior to the visit, additional usage of staff PPE,  and extensive cleaning of exam room while observing appropriate contact time as indicated for disinfecting solutions.

## 2020-02-01 NOTE — Patient Instructions (Signed)

## 2020-02-02 LAB — COMPREHENSIVE METABOLIC PANEL
AG Ratio: 1.6 (calc) (ref 1.0–2.5)
ALT: 23 U/L (ref 6–29)
AST: 14 U/L (ref 10–30)
Albumin: 4.1 g/dL (ref 3.6–5.1)
Alkaline phosphatase (APISO): 45 U/L (ref 31–125)
BUN: 24 mg/dL (ref 7–25)
CO2: 24 mmol/L (ref 20–32)
Calcium: 9 mg/dL (ref 8.6–10.2)
Chloride: 104 mmol/L (ref 98–110)
Creat: 0.74 mg/dL (ref 0.50–1.10)
Globulin: 2.5 g/dL (calc) (ref 1.9–3.7)
Glucose, Bld: 109 mg/dL — ABNORMAL HIGH (ref 65–99)
Potassium: 4.1 mmol/L (ref 3.5–5.3)
Sodium: 137 mmol/L (ref 135–146)
Total Bilirubin: 0.2 mg/dL (ref 0.2–1.2)
Total Protein: 6.6 g/dL (ref 6.1–8.1)

## 2020-02-02 LAB — TSH: TSH: 0.87 mIU/L

## 2020-02-04 ENCOUNTER — Encounter: Payer: Self-pay | Admitting: Internal Medicine

## 2020-02-05 ENCOUNTER — Encounter: Payer: Self-pay | Admitting: Dermatology

## 2020-02-08 MED ORDER — BUSPIRONE HCL 5 MG PO TABS: 5.0000 mg | ORAL_TABLET | Freq: Two times a day (BID) | ORAL | 0 refills | Status: DC | PRN

## 2020-02-11 ENCOUNTER — Telehealth: Payer: Self-pay

## 2020-02-11 NOTE — Telephone Encounter (Signed)
-----   Message from Ralene Bathe, MD sent at 02/09/2020  1:03 PM EDT ----- Skin , left mid buttocks DYSPLASTIC COMPOUND NEVUS WITH MODERATE ATYPIA, LATERAL MARGIN INVOLVED  Dysplastic Moderate Recheck next visit

## 2020-02-11 NOTE — Telephone Encounter (Signed)
Patient informed of pathology results 

## 2020-02-19 ENCOUNTER — Other Ambulatory Visit: Payer: Self-pay | Admitting: Internal Medicine

## 2020-02-19 DIAGNOSIS — Z1231 Encounter for screening mammogram for malignant neoplasm of breast: Secondary | ICD-10-CM

## 2020-03-01 ENCOUNTER — Other Ambulatory Visit: Payer: Self-pay | Admitting: Internal Medicine

## 2020-03-04 ENCOUNTER — Encounter: Payer: Self-pay | Admitting: Internal Medicine

## 2020-03-05 DIAGNOSIS — N926 Irregular menstruation, unspecified: Secondary | ICD-10-CM | POA: Diagnosis not present

## 2020-03-05 DIAGNOSIS — Z01419 Encounter for gynecological examination (general) (routine) without abnormal findings: Secondary | ICD-10-CM | POA: Diagnosis not present

## 2020-03-05 DIAGNOSIS — Z6827 Body mass index (BMI) 27.0-27.9, adult: Secondary | ICD-10-CM | POA: Diagnosis not present

## 2020-03-05 DIAGNOSIS — Z3041 Encounter for surveillance of contraceptive pills: Secondary | ICD-10-CM | POA: Diagnosis not present

## 2020-03-06 ENCOUNTER — Other Ambulatory Visit: Payer: Self-pay

## 2020-03-06 ENCOUNTER — Ambulatory Visit (INDEPENDENT_AMBULATORY_CARE_PROVIDER_SITE_OTHER): Payer: BC Managed Care – PPO | Admitting: Internal Medicine

## 2020-03-06 ENCOUNTER — Encounter: Payer: Self-pay | Admitting: Internal Medicine

## 2020-03-06 VITALS — BP 106/74 | HR 78 | Temp 98.4°F | Ht 65.75 in | Wt 167.0 lb

## 2020-03-06 DIAGNOSIS — K219 Gastro-esophageal reflux disease without esophagitis: Secondary | ICD-10-CM | POA: Diagnosis not present

## 2020-03-06 DIAGNOSIS — K582 Mixed irritable bowel syndrome: Secondary | ICD-10-CM

## 2020-03-06 DIAGNOSIS — Z0000 Encounter for general adult medical examination without abnormal findings: Secondary | ICD-10-CM

## 2020-03-06 DIAGNOSIS — F5101 Primary insomnia: Secondary | ICD-10-CM

## 2020-03-06 DIAGNOSIS — R7303 Prediabetes: Secondary | ICD-10-CM | POA: Diagnosis not present

## 2020-03-06 DIAGNOSIS — F419 Anxiety disorder, unspecified: Secondary | ICD-10-CM

## 2020-03-06 DIAGNOSIS — R519 Headache, unspecified: Secondary | ICD-10-CM

## 2020-03-06 LAB — CBC
HCT: 34.9 % — ABNORMAL LOW (ref 36.0–46.0)
Hemoglobin: 11.7 g/dL — ABNORMAL LOW (ref 12.0–15.0)
MCHC: 33.7 g/dL (ref 30.0–36.0)
MCV: 90.2 fl (ref 78.0–100.0)
Platelets: 323 10*3/uL (ref 150.0–400.0)
RBC: 3.87 Mil/uL (ref 3.87–5.11)
RDW: 12.8 % (ref 11.5–15.5)
WBC: 9.6 10*3/uL (ref 4.0–10.5)

## 2020-03-06 LAB — LIPID PANEL
Cholesterol: 186 mg/dL (ref 0–200)
HDL: 53.6 mg/dL (ref >39.00)
LDL Cholesterol: 119 mg/dL — ABNORMAL HIGH (ref 0–99)
NonHDL: 132.67
Total CHOL/HDL Ratio: 3
Triglycerides: 67 mg/dL (ref 0.0–149.0)
VLDL: 13.4 mg/dL (ref 0.0–40.0)

## 2020-03-06 LAB — VITAMIN D 25 HYDROXY (VIT D DEFICIENCY, FRACTURES): VITD: 64.98 ng/mL (ref 30.00–100.00)

## 2020-03-06 LAB — HEMOGLOBIN A1C: Hgb A1c MFr Bld: 5.9 % (ref 4.6–6.5)

## 2020-03-06 NOTE — Assessment & Plan Note (Signed)
Controlled with diet Continue Dicyclomine as needed We will monitor

## 2020-03-06 NOTE — Assessment & Plan Note (Signed)
Continue BuSpar as needed Support offered

## 2020-03-06 NOTE — Assessment & Plan Note (Signed)
Stable on Emgality, Imitrex and Phenergan She will continue to follow with neurology

## 2020-03-06 NOTE — Patient Instructions (Signed)
Health Maintenance, Female Adopting a healthy lifestyle and getting preventive care are important in promoting health and wellness. Ask your health care provider about:  The right schedule for you to have regular tests and exams.  Things you can do on your own to prevent diseases and keep yourself healthy. What should I know about diet, weight, and exercise? Eat a healthy diet   Eat a diet that includes plenty of vegetables, fruits, low-fat dairy products, and lean protein.  Do not eat a lot of foods that are high in solid fats, added sugars, or sodium. Maintain a healthy weight Body mass index (BMI) is used to identify weight problems. It estimates body fat based on height and weight. Your health care provider can help determine your BMI and help you achieve or maintain a healthy weight. Get regular exercise Get regular exercise. This is one of the most important things you can do for your health. Most adults should:  Exercise for at least 150 minutes each week. The exercise should increase your heart rate and make you sweat (moderate-intensity exercise).  Do strengthening exercises at least twice a week. This is in addition to the moderate-intensity exercise.  Spend less time sitting. Even light physical activity can be beneficial. Watch cholesterol and blood lipids Have your blood tested for lipids and cholesterol at 41 years of age, then have this test every 5 years. Have your cholesterol levels checked more often if:  Your lipid or cholesterol levels are high.  You are older than 40 years of age.  You are at high risk for heart disease. What should I know about cancer screening? Depending on your health history and family history, you may need to have cancer screening at various ages. This may include screening for:  Breast cancer.  Cervical cancer.  Colorectal cancer.  Skin cancer.  Lung cancer. What should I know about heart disease, diabetes, and high blood  pressure? Blood pressure and heart disease  High blood pressure causes heart disease and increases the risk of stroke. This is more likely to develop in people who have high blood pressure readings, are of African descent, or are overweight.  Have your blood pressure checked: ? Every 3-5 years if you are 18-39 years of age. ? Every year if you are 40 years old or older. Diabetes Have regular diabetes screenings. This checks your fasting blood sugar level. Have the screening done:  Once every three years after age 40 if you are at a normal weight and have a low risk for diabetes.  More often and at a younger age if you are overweight or have a high risk for diabetes. What should I know about preventing infection? Hepatitis B If you have a higher risk for hepatitis B, you should be screened for this virus. Talk with your health care provider to find out if you are at risk for hepatitis B infection. Hepatitis C Testing is recommended for:  Everyone born from 1945 through 1965.  Anyone with known risk factors for hepatitis C. Sexually transmitted infections (STIs)  Get screened for STIs, including gonorrhea and chlamydia, if: ? You are sexually active and are younger than 41 years of age. ? You are older than 41 years of age and your health care provider tells you that you are at risk for this type of infection. ? Your sexual activity has changed since you were last screened, and you are at increased risk for chlamydia or gonorrhea. Ask your health care provider if   you are at risk.  Ask your health care provider about whether you are at high risk for HIV. Your health care provider may recommend a prescription medicine to help prevent HIV infection. If you choose to take medicine to prevent HIV, you should first get tested for HIV. You should then be tested every 3 months for as long as you are taking the medicine. Pregnancy  If you are about to stop having your period (premenopausal) and  you may become pregnant, seek counseling before you get pregnant.  Take 400 to 800 micrograms (mcg) of folic acid every day if you become pregnant.  Ask for birth control (contraception) if you want to prevent pregnancy. Osteoporosis and menopause Osteoporosis is a disease in which the bones lose minerals and strength with aging. This can result in bone fractures. If you are 65 years old or older, or if you are at risk for osteoporosis and fractures, ask your health care provider if you should:  Be screened for bone loss.  Take a calcium or vitamin D supplement to lower your risk of fractures.  Be given hormone replacement therapy (HRT) to treat symptoms of menopause. Follow these instructions at home: Lifestyle  Do not use any products that contain nicotine or tobacco, such as cigarettes, e-cigarettes, and chewing tobacco. If you need help quitting, ask your health care provider.  Do not use street drugs.  Do not share needles.  Ask your health care provider for help if you need support or information about quitting drugs. Alcohol use  Do not drink alcohol if: ? Your health care provider tells you not to drink. ? You are pregnant, may be pregnant, or are planning to become pregnant.  If you drink alcohol: ? Limit how much you use to 0-1 drink a day. ? Limit intake if you are breastfeeding.  Be aware of how much alcohol is in your drink. In the [place] one drink equals one 12 oz bottle of beer (355 mL), one 5 oz glass of wine (148 mL), or one 1 oz glass of hard liquor (44 mL). General instructions  Schedule regular health, dental, and eye exams.  Stay current with your vaccines.  Tell your health care provider if: ? You often feel depressed. ? You have ever been abused or do not feel safe at home. Summary  Adopting a healthy lifestyle and getting preventive care are important in promoting health and wellness.  Follow your health care provider's instructions about healthy  diet, exercising, and getting tested or screened for diseases.  Follow your health care provider's instructions on monitoring your cholesterol and blood pressure. This information is not intended to replace advice given to you by your health care provider. Make sure you discuss any questions you have with your health care provider. Document Revised: 06/14/2018 Document Reviewed: 06/14/2018 Elsevier Patient Education  2020 Elsevier Inc.  

## 2020-03-06 NOTE — Assessment & Plan Note (Signed)
We will continue Trazodone as needed We will monitor

## 2020-03-06 NOTE — Assessment & Plan Note (Signed)
Avoid foods that trigger reflux Continue Omeprazole as needed CBC and C met today

## 2020-03-06 NOTE — Progress Notes (Signed)
Subjective:    Patient ID: Robin West, female    DOB: 1981-01-21, 39 y.o.   MRN: [id number]  HPI  Patient presents the clinic today for her annual exam.  She is also due to follow-up chronic conditions.  IBS: Alternating constipation and diarrhea.  Managed with diet and Dicyclomine as needed with good relief.  There is no colonoscopy on file.  Frequent Headaches: These occur around the week of her period.  Managed with Emgality, Imitrex, Phenergan as needed.  She follows with Dr. Brigitte Pulse.  Insomnia: She has trouble falling asleep.  She is taking Trazodone as needed with good results.  There is no sleep study on file.  Anxiety:  Managed on Buspar. She stopped Hydroxyzine. She is seeing a therapist. She denies depression, SI/HI.  GERD: She is not sure what triggers this. She takes Omeprazole as needed. There is no upper GI on file.  Flu: 02/2019 Tetanus: 11/2012 Covid: never Pap smear: 03/2020 Mammogram: 03/2019, scheduled 03/17/20 Vision screening: annually Dentist: biannually  Diet: She does eat meat. She consumes fruits and veggies daily. She tries to avoid fried foods. She drinks mostly coffee, water, rare soda. Exercise: Gym a few times per week, walking  Review of Systems      Past Medical History:  Diagnosis Date  . Allergy   . Anemia   . Chicken pox   . Dysplastic nevus 02/09/2017   Mid back spinal at bra line. Mild atypia, lateral and deep margins involved.  Marland Kitchen Dysplastic nevus 02/09/2017   Right proximal post. lat. thigh. Mild atypia, close to margin.  . Frequent headaches   . IBS (irritable bowel syndrome)     Current Outpatient Medications  Medication Sig Dispense Refill  . busPIRone (BUSPAR) 5 MG tablet Take 1 tablet (5 mg total) by mouth 2 (two) times daily as needed. 60 tablet 0  . Galcanezumab-gnlm (EMGALITY) 120 MG/ML SOSY Inject 120 mg into the skin every 28 (twenty-eight) days.    . hydrOXYzine (ATARAX/VISTARIL) 10 MG tablet Take 1 tablet (10 mg total)  by mouth daily as needed. 20 tablet 0  . JUNEL FE 1/20 1-20 MG-MCG tablet Take 1 tablet by mouth daily.  3  . Multiple Vitamin (MULTI-VITAMINS) TABS Take by mouth.    Marland Kitchen omeprazole (PRILOSEC) 40 MG capsule TAKE 1 CAPSULE BY MOUTH EVERY DAY 90 capsule 1  . OVER THE COUNTER MEDICATION Sambucus Elderberry 500mg , zinc 5mg  and Vit C 90mg  - one daily    . Probiotic Product (PROBIOTIC PO) Take by mouth. BYE BYE Bloat- one daily    . promethazine (PHENERGAN) 25 MG tablet TAKE 1 TABLET (25 MG TOTAL) BY MOUTH EVERY 6 (SIX) HOURS AS NEEDED FOR NAUSEA.    . SUMAtriptan (IMITREX) 100 MG tablet Take 1 tablet by mouth. For migraine may repeat in 2hrs if not better Max 2/24hr  2  . SUMAtriptan 6 MG/0.5ML SOAJ INJECT 0.5MLS SUBCUTANEOUSLY ONCE AS NEEDED FOR MIGRAINE. MAY REPEAT IN 1 HOUR IF NEEDED  3  . traZODone (DESYREL) 50 MG tablet TAKE 0.5-1 TABLETS (25-50 MG TOTAL) BY MOUTH AT BEDTIME AS NEEDED FOR SLEEP. 90 tablet 2   No current facility-administered medications for this visit.    No Known Allergies  Family History  Problem Relation Age of Onset  . Arthritis Father   . Hyperlipidemia Father   . Hypertension Father   . Arthritis Maternal Grandmother   . Stroke Maternal Grandfather   . Cancer Paternal Grandmother  Ovarian   . Cancer Paternal Aunt        ovarian cancer    Social History   Socioeconomic History  . Marital status: Married    Spouse name: Not on file  . Number of children: 2  . Years of education: Not on file  . Highest education level: Not on file  Occupational History  . Occupation: Press photographer Rep  Tobacco Use  . Smoking status: Never Smoker  . Smokeless tobacco: Never Used  Vaping Use  . Vaping Use: Never used  Substance and Sexual Activity  . Alcohol use: Yes    Alcohol/week: 0.0 standard drinks    Comment: rare  . Drug use: No  . Sexual activity: Yes    Partners: Male    Birth control/protection: Pill  Other Topics Concern  . Not on file  Social History  Narrative  . Not on file   Social Determinants of Health   Financial Resource Strain:   . Difficulty of Paying Living Expenses: Not on file  Food Insecurity:   . Worried About Charity fundraiser in the Last Year: Not on file  . Ran Out of Food in the Last Year: Not on file  Transportation Needs:   . Lack of Transportation (Medical): Not on file  . Lack of Transportation (Non-Medical): Not on file  Physical Activity:   . Days of Exercise per Week: Not on file  . Minutes of Exercise per Session: Not on file  Stress:   . Feeling of Stress : Not on file  Social Connections:   . Frequency of Communication with Friends and Family: Not on file  . Frequency of Social Gatherings with Friends and Family: Not on file  . Attends Religious Services: Not on file  . Active Member of Clubs or Organizations: Not on file  . Attends Archivist Meetings: Not on file  . Marital Status: Not on file  Intimate Partner Violence:   . Fear of Current or Ex-Partner: Not on file  . Emotionally Abused: Not on file  . Physically Abused: Not on file  . Sexually Abused: Not on file     Constitutional: Pt reports intermittent headaches. Denies fever, malaise, fatigue, or abrupt weight changes.  HEENT: Denies eye pain, eye redness, ear pain, ringing in the ears, wax buildup, runny nose, nasal congestion, bloody nose, or sore throat. Respiratory: Denies difficulty breathing, shortness of breath, cough or sputum production.   Cardiovascular: Denies chest pain, chest tightness, palpitations or swelling in the hands or feet.  Gastrointestinal: Pt reports intermittent diarrhea, constipation. Denies abdominal pain, bloating, or blood in the stool.  GU: Denies urgency, frequency, pain with urination, burning sensation, blood in urine, odor or discharge. Musculoskeletal: Denies decrease in range of motion, difficulty with gait, muscle pain or joint pain and swelling.  Skin: Denies redness, rashes, lesions or  ulcercations.  Neurological: Pt reports insomnia. Denies dizziness, difficulty with memory, difficulty with speech or problems with balance and coordination.  Psych: Pt reports intermittent anxiety. Denies depression, SI/HI.  No other specific complaints in a complete review of systems (except as listed in HPI above).  Objective:   Physical Exam  BP 106/74   Pulse 78   Temp 98.4 F (36.9 C) (Temporal)   Ht 5' 5.75" (1.67 m)   Wt 167 lb (75.8 kg)   LMP 02/20/2020 (Approximate)   SpO2 98%   BMI 27.16 kg/m   Wt Readings from Last 3 Encounters:  02/01/20 166 lb (75.3  kg)  10/01/19 169 lb (76.7 kg)  07/04/19 177 lb (80.3 kg)    General: Appears her stated age, well developed, well nourished in NAD. Skin: Warm, dry and intact. No rashes noted. HEENT: Head: normal shape and size; Eyes: sclera white, no icterus, conjunctiva pink, PERRLA and EOMs intact;  Neck:  Neck supple, trachea midline. No masses, lumps or thyromegaly present.  Cardiovascular: Normal rate and rhythm. S1,S2 noted.  No murmur, rubs or gallops noted. No JVD or BLE edema. Pulmonary/Chest: Normal effort and positive vesicular breath sounds. No respiratory distress. No wheezes, rales or ronchi noted.  Abdomen: Soft and nontender. Normal bowel sounds. No distention or masses noted. Liver, spleen and kidneys non palpable. Musculoskeletal: Strength 5/5 BUE/BLE. No difficulty with gait.  Neurological: Alert and oriented. Cranial nerves II-XII grossly intact. Coordination normal.  Psychiatric: Mood and affect normal. Behavior is normal. Judgment and thought content normal.    BMET    Component Value Date/Time   NA 137 02/01/2020 1422   K 4.1 02/01/2020 1422   CL 104 02/01/2020 1422   CO2 24 02/01/2020 1422   GLUCOSE 109 (H) 02/01/2020 1422   BUN 24 02/01/2020 1422   CREATININE 0.74 02/01/2020 1422   CALCIUM 9.0 02/01/2020 1422   GFRNONAA >60 02/29/2016 1902   GFRAA >60 02/29/2016 1902    Lipid Panel       Component Value Date/Time   CHOL 178 03/05/2019 0851   TRIG 70.0 03/05/2019 0851   HDL 52.30 03/05/2019 0851   CHOLHDL 3 03/05/2019 0851   VLDL 14.0 03/05/2019 0851   LDLCALC 111 (H) 03/05/2019 0851    CBC    Component Value Date/Time   WBC 7.8 03/05/2019 0851   RBC 3.76 (L) 03/05/2019 0851   HGB 11.6 (L) 03/05/2019 0851   HCT 34.4 (L) 03/05/2019 0851   PLT 334.0 03/05/2019 0851   MCV 91.4 03/05/2019 0851   MCH 31.0 02/29/2016 1902   MCHC 33.6 03/05/2019 0851   RDW 12.6 03/05/2019 0851    Hgb A1C Lab Results  Component Value Date   HGBA1C 5.5 05/29/2018           Assessment & Plan:   Preventative Health Maintenance:  Encouraged her to get a flu shot in the fall Tetanus UTD Encouraged her to get a Covid vaccine Pap smear UTD, will request copy Mammogram scheduled Encouraged her to consume a balanced diet and exercise regimen Advised her to see an eye doctor and dentist annually CMET and TSH reviewed Will check CBC, Lipid, A1C and Vit D today  RTC in 1 year, sooner if needed Webb Silversmith, NP This visit occurred during the SARS-CoV-2 public health emergency.  Safety protocols were in place, including screening questions prior to the visit, additional usage of staff PPE, and extensive cleaning of exam room while observing appropriate contact time as indicated for disinfecting solutions.

## 2020-03-07 ENCOUNTER — Encounter: Payer: Self-pay | Admitting: Internal Medicine

## 2020-03-26 ENCOUNTER — Other Ambulatory Visit: Payer: Self-pay

## 2020-03-26 ENCOUNTER — Ambulatory Visit
Admission: RE | Admit: 2020-03-26 | Discharge: 2020-03-26 | Disposition: A | Discharge status: Home / Self Care | Payer: BC Managed Care – PPO | Source: Ambulatory Visit | Attending: Internal Medicine | Admitting: Internal Medicine

## 2020-03-26 DIAGNOSIS — Z1231 Encounter for screening mammogram for malignant neoplasm of breast: Secondary | ICD-10-CM | POA: Diagnosis not present

## 2020-04-10 ENCOUNTER — Encounter: Payer: Self-pay | Admitting: Internal Medicine

## 2020-04-11 ENCOUNTER — Encounter: Payer: Self-pay | Admitting: Gastroenterology

## 2020-04-11 ENCOUNTER — Other Ambulatory Visit (INDEPENDENT_AMBULATORY_CARE_PROVIDER_SITE_OTHER): Payer: BC Managed Care – PPO

## 2020-04-11 ENCOUNTER — Ambulatory Visit: Payer: BC Managed Care – PPO | Admitting: Gastroenterology

## 2020-04-11 VITALS — BP 112/72 | HR 86 | Ht 66.0 in | Wt 166.4 lb

## 2020-04-11 DIAGNOSIS — K921 Melena: Secondary | ICD-10-CM | POA: Diagnosis not present

## 2020-04-11 DIAGNOSIS — R195 Other fecal abnormalities: Secondary | ICD-10-CM

## 2020-04-11 DIAGNOSIS — K219 Gastro-esophageal reflux disease without esophagitis: Secondary | ICD-10-CM

## 2020-04-11 DIAGNOSIS — R197 Diarrhea, unspecified: Secondary | ICD-10-CM | POA: Diagnosis not present

## 2020-04-11 DIAGNOSIS — R1010 Upper abdominal pain, unspecified: Secondary | ICD-10-CM

## 2020-04-11 DIAGNOSIS — K648 Other hemorrhoids: Secondary | ICD-10-CM

## 2020-04-11 DIAGNOSIS — R140 Abdominal distension (gaseous): Secondary | ICD-10-CM

## 2020-04-11 LAB — HIGH SENSITIVITY CRP: CRP, High Sensitivity: 46.27 mg/L — ABNORMAL HIGH (ref 0.000–5.000)

## 2020-04-11 LAB — SEDIMENTATION RATE: Sed Rate: 31 mm/hr — ABNORMAL HIGH (ref 0–20)

## 2020-04-11 MED ORDER — CLENPIQ 10-3.5-12 MG-GM -GM/160ML PO SOLN: 1.0000 | ORAL | 0 refills | Status: DC

## 2020-04-11 NOTE — Progress Notes (Signed)
P  Chief Complaint:    Abdominal pain, rectal bleeding, GERD, symptomatic hemorrhoids  GI History: 39 year old female with a history of anxiety, migraines who follows in the GI clinic for the following:  1) IBS-mixed type: Several year history of alternating bowel habits and abdominal bloating. More typically diarrhea/increased stool frequency but can also have alternating constipation. Constipation tends to aggravate hemorrhoids. Uses probiotic (called "bye-bye bloating") with improvement of bloating. Trialed low FODMAP diet in 06/2019. -Celiac serology negative  2) GERD: Index symptoms of heartburn and regurgitation. Improved with omeprazole.  3) Symptomatic hemorrhoids: Hemorrhoidectomy in her early 61s. Occasional small-volume BRBPR.  HPI:     Patient is a 39 y.o. female presenting to the Gastroenterology Clinic for follow-up. Was initially seen by Tye Savoy on 06/07/2019 and in follow-up on 07/04/2019 for her above GI symptoms.  Today, states she has had increased abdominal pain which she attributes to eating more bread last week along with increased stool frequency, hematochezia, mucus-like stools.   15 BM ealier in the week, and now with small volume stools with possible tenesmus. Decreased PO intake to try to treat.   Flex sig in her 20's at time of hemorhoid surgery.  No previous colonoscopy.  Has a history of reflux, but eventually titrated off PPI and was controlling with diet/lifestyle modifications alone.  However, significant breakthrough reflux event 1 week ago which woke her from sleep (regurgitation/choking).  Since then, has had daily symptoms of heartburn and regurgitation.  Now back to sleeping with HOB elevated, avoid eating close to bedtime, and has restarted her omeprazole, but still with breakthrough symptoms.  No dysphagia.  No previous EGD.  Does have abdominal bloating and visible abdominal distension, with generalized abdominal discomfort.  He is using  heating pad at night.  No medications or supplements for this issue.  Had previously eliminated bread from diet due to exacerbation of symptoms noted on diet diary.  -03/05/2019: H/H 11.6/34.4, MCV/RDW 91/13 -06/07/2019: Normal iron panel, negative/normal celiac serology -03/06/2020: H/H 11.7/34.9, MCV/RDW 90/13. Normal vitamin D, A1c   Review of systems:     No chest pain, no SOB, no fevers, no urinary sx   Past Medical History:  Diagnosis Date  . Allergy   . Anemia   . Chicken pox   . Dysplastic nevus 02/09/2017   Mid back spinal at bra line. Mild atypia, lateral and deep margins involved.  Marland Kitchen Dysplastic nevus 02/09/2017   Right proximal post. lat. thigh. Mild atypia, close to margin.  . Frequent headaches   . IBS (irritable bowel syndrome)     Patient's surgical history, family medical history, social history, medications and allergies were all reviewed in Epic    Current Outpatient Medications  Medication Sig Dispense Refill  . busPIRone (BUSPAR) 5 MG tablet Take 1 tablet (5 mg total) by mouth 2 (two) times daily as needed. (Patient taking differently: Take 5 mg by mouth as needed. ) 60 tablet 0  . Galcanezumab-gnlm (EMGALITY) 120 MG/ML SOSY Inject 120 mg into the skin every 28 (twenty-eight) days.    Lenda Kelp FE 1/20 1-20 MG-MCG tablet Take 1 tablet by mouth daily.  3  . Multiple Vitamin (MULTI-VITAMINS) TABS Take by mouth.    Marland Kitchen omeprazole (PRILOSEC) 40 MG capsule TAKE 1 CAPSULE BY MOUTH EVERY DAY (Patient taking differently: Take 40 mg by mouth as needed. ) 90 capsule 1  . OVER THE COUNTER MEDICATION Sambucus Elderberry 500mg , zinc 5mg  and Vit C 90mg  - one daily    .  Probiotic Product (PROBIOTIC PO) Take by mouth. BYE BYE Bloat- one daily    . promethazine (PHENERGAN) 25 MG tablet Take 25 mg by mouth as needed.     . SUMAtriptan (IMITREX) 100 MG tablet Take 1 tablet by mouth as needed. For migraine may repeat in 2hrs if not better Max 2/24hr  2  . SUMAtriptan 6 MG/0.5ML SOAJ as  needed.   3  . traZODone (DESYREL) 50 MG tablet TAKE 0.5-1 TABLETS (25-50 MG TOTAL) BY MOUTH AT BEDTIME AS NEEDED FOR SLEEP. 90 tablet 2   No current facility-administered medications for this visit.    Physical Exam:     BP 112/72   Pulse 86   Ht 5\' 6"  (1.676 m)   Wt 166 lb 6 oz (75.5 kg)   BMI 26.85 kg/m   GENERAL:  Pleasant female in NAD PSYCH: : Cooperative, normal affect EENT:  conjunctiva pink, mucous membranes moist, neck supple without masses CARDIAC:  RRR, no murmur heard, no peripheral edema PULM: Normal respiratory effort, lungs CTA bilaterally, no wheezing ABDOMEN: Mild TTP in upper abdomen without rebound or guarding.  No peritoneal signs.  Nondistended, soft. No obvious masses, no hepatomegaly,  normal bowel sounds SKIN:  turgor, no lesions seen Musculoskeletal:  Normal muscle tone, normal strength NEURO: Alert and oriented x 3, no focal neurologic deficits Rectal: Exam deferred to time of colonoscopy.    IMPRESSION and PLAN:    1) Hematochezia 2) Change in bowel habits 3) Mucus-like stools -Check stool studies-GI PCR panel, fecal calprotectin -Colonoscopy to evaluate for IBD along with random and directed biopsies  4) History of IBS -Symptoms have been well controlled, but recent LGI symptoms are inconsistent with previous exacerbation/flare.  Evaluate further as above  5) History of hemorrhoids s/p prior hemorrhoidectomy -Evaluate for clinical recurrence of hemorrhoids at time of colonoscopy -Discussed potential hemorrhoid banding  6) GERD 7) Regurgitation -EGD to evaluate for erosive esophagitis, LES laxity, hiatal hernia -Continue antireflux lifestyle/dietary modifications -Continue PPI as currently doing  8) Abdominal bloating 9) Abdominal distention 10) Abdominal pain (upper) -Evaluate for mucosal/luminal etiology at time of EGD/colonoscopy as above -Gastric and duodenal biopsies  The indications, risks, and benefits of EGD and colonoscopy  were explained to the patient in detail. Risks include but are not limited to bleeding, perforation, adverse reaction to medications, and cardiopulmonary compromise. Sequelae include but are not limited to the possibility of surgery, hositalization, and mortality. The patient verbalized understanding and wished to proceed. All questions answered, referred to scheduler and bowel prep ordered. Further recommendations pending results of the exam.           Brooks ,DO, FACG 04/11/2020, 9:07 AM

## 2020-04-11 NOTE — Patient Instructions (Addendum)
If you are age 41 or older, your body mass index should be between 23-30. Your Body mass index is 26.85 kg/m. If this is out of the aforementioned range listed, please consider follow up with your Primary Care Provider.  If you are age 35 or younger, your body mass index should be between 19-25. Your Body mass index is 26.85 kg/m. If this is out of the aformentioned range listed, please consider follow up with your Primary Care Provider.   You have been scheduled for an endoscopy and colonoscopy. Please follow the written instructions given to you at your visit today. Please pick up your prep supplies at the pharmacy within the next 1-3 days. If you use inhalers (even only as needed), please bring them with you on the day of your procedure.  We have sent the following medications to your pharmacy for you to pick up at your convenience: [facility] provider has requested that you go to the basement level for lab work at [facility], [place], [place]. Press "B" on the elevator. The lab is located at the first door on the left as you exit the elevator.   It was a pleasure to see you today!  Vito Cirigliano, D.O.

## 2020-04-14 ENCOUNTER — Other Ambulatory Visit: Payer: BC Managed Care – PPO

## 2020-04-14 DIAGNOSIS — R195 Other fecal abnormalities: Secondary | ICD-10-CM | POA: Diagnosis not present

## 2020-04-14 DIAGNOSIS — K219 Gastro-esophageal reflux disease without esophagitis: Secondary | ICD-10-CM

## 2020-04-14 DIAGNOSIS — K921 Melena: Secondary | ICD-10-CM | POA: Diagnosis not present

## 2020-04-14 DIAGNOSIS — R197 Diarrhea, unspecified: Secondary | ICD-10-CM

## 2020-04-15 ENCOUNTER — Other Ambulatory Visit: Payer: Self-pay | Admitting: Gastroenterology

## 2020-04-15 ENCOUNTER — Ambulatory Visit: Payer: BC Managed Care – PPO | Admitting: Internal Medicine

## 2020-04-15 DIAGNOSIS — R195 Other fecal abnormalities: Secondary | ICD-10-CM

## 2020-04-15 DIAGNOSIS — R197 Diarrhea, unspecified: Secondary | ICD-10-CM

## 2020-04-17 LAB — GI PROFILE, STOOL, PCR

## 2020-04-17 LAB — CALPROTECTIN, FECAL: Calprotectin, Fecal: 34 ug/g (ref 0–120)

## 2020-04-21 ENCOUNTER — Other Ambulatory Visit: Payer: Self-pay | Admitting: Gastroenterology

## 2020-04-21 LAB — SARS CORONAVIRUS 2 (TAT 6-24 HRS): SARS Coronavirus 2: NEGATIVE

## 2020-04-22 ENCOUNTER — Encounter: Payer: Self-pay | Admitting: Internal Medicine

## 2020-04-22 ENCOUNTER — Other Ambulatory Visit: Payer: Self-pay

## 2020-04-22 ENCOUNTER — Ambulatory Visit: Payer: BC Managed Care – PPO | Admitting: Internal Medicine

## 2020-04-22 VITALS — BP 108/70 | HR 85 | Temp 98.0°F | Wt 166.0 lb

## 2020-04-22 DIAGNOSIS — M79622 Pain in left upper arm: Secondary | ICD-10-CM | POA: Diagnosis not present

## 2020-04-22 NOTE — Progress Notes (Signed)
Subjective:    Patient ID: Robin West, female    DOB: 01-19-1981, 39 y.o.   MRN: [id number]  HPI  Pt presents to the clinic today with c/o pain in her left armpit. She noticed  2-3 months ago. It is intermittent. She describes the pain as dull and achy, tender to touch. She has not noticed any masses or lumps in the area. She has not noticed any breast masses, lumps, changes in the skin or nipple discharge, normal mammogram 04/2020. She denies any injury to the shoulder that she is aware of.  Review of Systems      Past Medical History:  Diagnosis Date  . Allergy   . Anemia   . Chicken pox   . Dysplastic nevus 02/09/2017   Mid back spinal at bra line. Mild atypia, lateral and deep margins involved.  Marland Kitchen Dysplastic nevus 02/09/2017   Right proximal post. lat. thigh. Mild atypia, close to margin.  . Frequent headaches   . IBS (irritable bowel syndrome)     Current Outpatient Medications  Medication Sig Dispense Refill  . busPIRone (BUSPAR) 5 MG tablet Take 1 tablet (5 mg total) by mouth 2 (two) times daily as needed. (Patient taking differently: Take 5 mg by mouth as needed. ) 60 tablet 0  . Galcanezumab-gnlm (EMGALITY) 120 MG/ML SOSY Inject 120 mg into the skin every 28 (twenty-eight) days.    Lenda Kelp FE 1/20 1-20 MG-MCG tablet Take 1 tablet by mouth daily.  3  . Multiple Vitamin (MULTI-VITAMINS) TABS Take by mouth.    Marland Kitchen omeprazole (PRILOSEC) 40 MG capsule TAKE 1 CAPSULE BY MOUTH EVERY DAY (Patient taking differently: Take 40 mg by mouth as needed. ) 90 capsule 1  . OVER THE COUNTER MEDICATION Sambucus Elderberry 500mg , zinc 5mg  and Vit C 90mg  - one daily    . Probiotic Product (PROBIOTIC PO) Take by mouth. BYE BYE Bloat- one daily    . promethazine (PHENERGAN) 25 MG tablet Take 25 mg by mouth as needed.     . Sod Picosulfate-Mag Ox-Cit Acd (CLENPIQ) 10-3.5-12 MG-GM -GM/160ML SOLN Take 1 kit by mouth as directed. 320 mL 0  . SUMAtriptan (IMITREX) 100 MG tablet Take 1 tablet by  mouth as needed. For migraine may repeat in 2hrs if not better Max 2/24hr  2  . SUMAtriptan 6 MG/0.5ML SOAJ as needed.   3  . traZODone (DESYREL) 50 MG tablet TAKE 0.5-1 TABLETS (25-50 MG TOTAL) BY MOUTH AT BEDTIME AS NEEDED FOR SLEEP. 90 tablet 2   No current facility-administered medications for this visit.    No Known Allergies  Family History  Problem Relation Age of Onset  . Arthritis Father   . Hyperlipidemia Father   . Hypertension Father   . Arthritis Maternal Grandmother   . Stroke Maternal Grandfather   . Cancer Paternal Grandmother        Ovarian   . Cancer Paternal Aunt        ovarian cancer  . Cancer Paternal Uncle   . Colon cancer Neg Hx     Social History   Socioeconomic History  . Marital status: Married    Spouse name: Not on file  . Number of children: 2  . Years of education: Not on file  . Highest education level: Not on file  Occupational History  . Occupation: Press photographer Rep  Tobacco Use  . Smoking status: Never Smoker  . Smokeless tobacco: Never Used  Vaping Use  . Vaping Use: Never used  Substance and Sexual Activity  . Alcohol use: Yes    Alcohol/week: 0.0 standard drinks    Comment: rare  . Drug use: No  . Sexual activity: Yes    Partners: Male    Birth control/protection: Pill  Other Topics Concern  . Not on file  Social History Narrative  . Not on file   Social Determinants of Health   Financial Resource Strain:   . Difficulty of Paying Living Expenses: Not on file  Food Insecurity:   . Worried About Charity fundraiser in the Last Year: Not on file  . Ran Out of Food in the Last Year: Not on file  Transportation Needs:   . Lack of Transportation (Medical): Not on file  . Lack of Transportation (Non-Medical): Not on file  Physical Activity:   . Days of Exercise per Week: Not on file  . Minutes of Exercise per Session: Not on file  Stress:   . Feeling of Stress : Not on file  Social Connections:   . Frequency of Communication  with Friends and Family: Not on file  . Frequency of Social Gatherings with Friends and Family: Not on file  . Attends Religious Services: Not on file  . Active Member of Clubs or Organizations: Not on file  . Attends Archivist Meetings: Not on file  . Marital Status: Not on file  Intimate Partner Violence:   . Fear of Current or Ex-Partner: Not on file  . Emotionally Abused: Not on file  . Physically Abused: Not on file  . Sexually Abused: Not on file     Constitutional: Denies fever, malaise, fatigue, headache or abrupt weight changes.  Respiratory: Denies difficulty breathing, shortness of breath, cough or sputum production.   Cardiovascular: Denies chest pain, chest tightness, palpitations or swelling in the hands or feet.  Musculoskeletal: Denies decrease in range of motion, difficulty with gait, muscle pain or joint pain and swelling.  Skin: Pt reports pain in her left armpit. Denies redness, rashes, lesions or ulcercations.    No other specific complaints in a complete review of systems (except as listed in HPI above).  Objective:   Physical Exam BP 108/70   Pulse 85   Temp 98 F (36.7 C) (Temporal)   Wt 166 lb (75.3 kg)   SpO2 98%   BMI 26.79 kg/m   There were no vitals taken for this visit. Wt Readings from Last 3 Encounters:  04/11/20 166 lb 6 oz (75.5 kg)  03/06/20 167 lb (75.8 kg)  02/01/20 166 lb (75.3 kg)    General: Appears her stated age, well developed, well nourished in NAD. Skin: Warm, dry and intact. No rashes, masses or lumps noted. Cardiovascular: Normal rate. Pulmonary/Chest: Normal effort. Musculoskeletal: Normal internal and external rotation of the left shoulder. No pain with palpation of the left shoulder. Neurological: Alert and oriented.    BMET    Component Value Date/Time   NA 137 02/01/2020 1422   K 4.1 02/01/2020 1422   CL 104 02/01/2020 1422   CO2 24 02/01/2020 1422   GLUCOSE 109 (H) 02/01/2020 1422   BUN 24  02/01/2020 1422   CREATININE 0.74 02/01/2020 1422   CALCIUM 9.0 02/01/2020 1422   GFRNONAA >60 02/29/2016 1902   GFRAA >60 02/29/2016 1902    Lipid Panel     Component Value Date/Time   CHOL 186 03/06/2020 0838   TRIG 67.0 03/06/2020 0838   HDL 53.60 03/06/2020 0838   CHOLHDL 3 03/06/2020  0413   VLDL 13.4 03/06/2020 0838   LDLCALC 119 (H) 03/06/2020 0838    CBC    Component Value Date/Time   WBC 9.6 03/06/2020 0838   RBC 3.87 03/06/2020 0838   HGB 11.7 (L) 03/06/2020 0838   HCT 34.9 (L) 03/06/2020 0838   PLT 323.0 03/06/2020 0838   MCV 90.2 03/06/2020 0838   MCH 31.0 02/29/2016 1902   MCHC 33.7 03/06/2020 0838   RDW 12.8 03/06/2020 0838    Hgb A1C Lab Results  Component Value Date   HGBA1C 5.9 03/06/2020            Assessment & Plan:   Pain in Left Armpit:  Exam benign No evidence of lump, mass, ingrown hair, abscess etc Recent mammogram reviewed No indication for further imaging at this time Encouraged stretching of the left shoulder  Notify me if pain persist or worsens  Webb Silversmith, NP This visit occurred during the SARS-CoV-2 public health emergency.  Safety protocols were in place, including screening questions prior to the visit, additional usage of staff PPE, and extensive cleaning of exam room while observing appropriate contact time as indicated for disinfecting solutions.

## 2020-04-22 NOTE — Patient Instructions (Signed)

## 2020-04-23 ENCOUNTER — Ambulatory Visit (AMBULATORY_SURGERY_CENTER): Payer: BC Managed Care – PPO | Admitting: Gastroenterology

## 2020-04-23 ENCOUNTER — Encounter: Payer: Self-pay | Admitting: Gastroenterology

## 2020-04-23 VITALS — BP 117/76 | HR 72 | Temp 98.0°F | Resp 14 | Ht 66.0 in | Wt 166.0 lb

## 2020-04-23 DIAGNOSIS — K219 Gastro-esophageal reflux disease without esophagitis: Secondary | ICD-10-CM

## 2020-04-23 DIAGNOSIS — K5730 Diverticulosis of large intestine without perforation or abscess without bleeding: Secondary | ICD-10-CM

## 2020-04-23 DIAGNOSIS — R197 Diarrhea, unspecified: Secondary | ICD-10-CM

## 2020-04-23 DIAGNOSIS — D125 Benign neoplasm of sigmoid colon: Secondary | ICD-10-CM

## 2020-04-23 DIAGNOSIS — K635 Polyp of colon: Secondary | ICD-10-CM | POA: Diagnosis present

## 2020-04-23 DIAGNOSIS — R194 Change in bowel habit: Secondary | ICD-10-CM

## 2020-04-23 DIAGNOSIS — K319 Disease of stomach and duodenum, unspecified: Secondary | ICD-10-CM | POA: Diagnosis not present

## 2020-04-23 DIAGNOSIS — K921 Melena: Secondary | ICD-10-CM

## 2020-04-23 DIAGNOSIS — R140 Abdominal distension (gaseous): Secondary | ICD-10-CM

## 2020-04-23 DIAGNOSIS — R195 Other fecal abnormalities: Secondary | ICD-10-CM

## 2020-04-23 MED ORDER — SODIUM CHLORIDE 0.9 % IV SOLN: 500.0000 mL | INTRAVENOUS | Status: DC

## 2020-04-23 NOTE — Op Note (Signed)
[facility] Patient Name: Robin West Procedure Date: 04/23/2020 1:27 PM MRN: [id number] Endoscopist: Gerrit Heck , MD Age: 39 Referring MD:  Date of Birth: [birth year] Gender: Female Account #: [id number] Procedure:                Upper GI endoscopy Indications:              Generalized abdominal pain, Suspected esophageal                            reflux, Abdominal bloating Medicines:                Monitored Anesthesia Care Procedure:                Pre-Anesthesia Assessment:                           - Prior to the procedure, a History and Physical                            was performed, and patient medications and                            allergies were reviewed. The patient's tolerance of                            previous anesthesia was also reviewed. The risks                            and benefits of the procedure and the sedation                            options and risks were discussed with the patient.                            All questions were answered, and informed consent                            was obtained. Prior Anticoagulants: The patient has                            taken no previous anticoagulant or antiplatelet                            agents. ASA Grade Assessment: II - A patient with                            mild systemic disease. After reviewing the risks                            and benefits, the patient was deemed in                            satisfactory condition to undergo the procedure.  After obtaining informed consent, the endoscope was                            passed under direct vision. Throughout the                            procedure, the patient's blood pressure, pulse, and                            oxygen saturations were monitored continuously. The                            Endoscope was introduced through the mouth, and                            advanced to the second part  of duodenum. The upper                            GI endoscopy was accomplished without difficulty.                            The patient tolerated the procedure well. Scope In: Scope Out: Findings:                 The examined esophagus was normal.                           The gastroesophageal flap valve was visualized                            endoscopically and classified as Hill Grade II                            (fold present, opens with respiration).                           The entire examined stomach was normal. Biopsies                            were taken with a cold forceps for Helicobacter                            pylori testing. Estimated blood loss was minimal.                           The examined duodenum was normal. Biopsies for                            histology were taken with a cold forceps for                            evaluation of celiac disease. Estimated blood loss                            was  minimal. Complications:            No immediate complications. Estimated Blood Loss:     Estimated blood loss was minimal. Impression:               - Normal esophagus.                           - Gastroesophageal flap valve classified as Hill                            Grade II (fold present, opens with respiration).                           - Normal stomach. Biopsied.                           - Normal examined duodenum. Biopsied. Recommendation:           - Patient has a contact number available for                            emergencies. The signs and symptoms of potential                            delayed complications were discussed with the                            patient. Return to normal activities tomorrow.                            Written discharge instructions were provided to the                            patient.                           - Resume previous diet.                           - Continue present medications.                            - Await pathology results.                           - Perform a colonoscopy today. Gerrit Heck, MD 04/23/2020 2:10:34 PM

## 2020-04-23 NOTE — Op Note (Signed)
[facility] Patient Name: Robin West Procedure Date: 04/23/2020 1:26 PM MRN: [id number] Endoscopist: Gerrit Heck , MD Age: 39 Referring MD:  Date of Birth: [birth year] Gender: Female Account #: [id number] Procedure:                Colonoscopy Indications:              Generalized abdominal pain, Change in bowel habits,                            Diarrhea, Tenesmus, Elevated ESR/CRP, mucus like                            stools Medicines:                Monitored Anesthesia Care Procedure:                Pre-Anesthesia Assessment:                           - Prior to the procedure, a History and Physical                            was performed, and patient medications and                            allergies were reviewed. The patient's tolerance of                            previous anesthesia was also reviewed. The risks                            and benefits of the procedure and the sedation                            options and risks were discussed with the patient.                            All questions were answered, and informed consent                            was obtained. Prior Anticoagulants: The patient has                            taken no previous anticoagulant or antiplatelet                            agents. ASA Grade Assessment: II - A patient with                            mild systemic disease. After reviewing the risks                            and benefits, the patient was deemed in  satisfactory condition to undergo the procedure.                           After obtaining informed consent, the colonoscope                            was passed under direct vision. Throughout the                            procedure, the patient's blood pressure, pulse, and                            oxygen saturations were monitored continuously. The                            Colonoscope was introduced through the anus and                             advanced to the the terminal ileum. The colonoscopy                            was performed without difficulty. The patient                            tolerated the procedure well. The quality of the                            bowel preparation was good. The terminal ileum,                            ileocecal valve, appendiceal orifice, and rectum                            were photographed. Scope In: 1:45:34 PM Scope Out: 2:02:40 PM Scope Withdrawal Time: 0 hours 13 minutes 16 seconds  Total Procedure Duration: 0 hours 17 minutes 6 seconds  Findings:                 The perianal and digital rectal examinations were                            normal.                           Two sessile polyps were found in the sigmoid colon.                            The polyps were 2 to 3 mm in size. These polyps                            were removed with a cold biopsy forceps. Resection                            and retrieval were complete. Estimated blood loss  was minimal.                           A few small-mouthed diverticula were found in the                            sigmoid colon.                           The mucosa was otherwise normal throughout the                            remainder of the colon. Biopsies for histology were                            taken with a cold forceps from the right colon and                            left colon for evaluation of microscopic colitis.                            Estimated blood loss was minimal.                           The retroflexed view of the distal rectum and anal                            verge was normal and showed no anal or rectal                            abnormalities.                           The terminal ileum appeared normal. Complications:            No immediate complications. Estimated Blood Loss:     Estimated blood loss was minimal. Impression:               -  Two 2 to 3 mm polyps in the sigmoid colon,                            removed with a cold biopsy forceps. Resected and                            retrieved.                           - Diverticulosis.                           - Normal mucosa in the entire examined colon.                            Biopsied.                           -  The distal rectum and anal verge are normal on                            retroflexion view.                           - The examined portion of the ileum was normal. Recommendation:           - Patient has a contact number available for                            emergencies. The signs and symptoms of potential                            delayed complications were discussed with the                            patient. Return to normal activities tomorrow.                            Written discharge instructions were provided to the                            patient.                           - Resume previous diet.                           - Continue present medications.                           - Await pathology results.                           - Repeat colonoscopy for surveillance based on                            pathology results.                           - Return to GI office at appointment to be                            scheduled.                           - Use fiber, for example Citrucel, Fibercon, Konsyl                            or Metamucil.                           - Repeat ESR and CRP in 1 month. Gerrit Heck, MD 04/23/2020 2:19:17 PM

## 2020-04-23 NOTE — Patient Instructions (Signed)
YOU HAD AN ENDOSCOPIC PROCEDURE TODAY AT THE [facility]:   Refer to the procedure report that was given to you for any specific questions about what was found during the examination.  If the procedure report does not answer your questions, please call your gastroenterologist to clarify.  If you requested that your care partner not be given the details of your procedure findings, then the procedure report has been included in a sealed envelope for you to review at your convenience later.  YOU SHOULD EXPECT: Some feelings of bloating in the abdomen. Passage of more gas than usual.  Walking can help get rid of the air that was put into your GI tract during the procedure and reduce the bloating. If you had a lower endoscopy (such as a colonoscopy or flexible sigmoidoscopy) you may notice spotting of blood in your stool or on the toilet paper. If you underwent a bowel prep for your procedure, you may not have a normal bowel movement for a few days.  Please Note:  You might notice some irritation and congestion in your nose or some drainage.  This is from the oxygen used during your procedure.  There is no need for concern and it should clear up in a day or so.  SYMPTOMS TO REPORT IMMEDIATELY:   Following lower endoscopy (colonoscopy or flexible sigmoidoscopy):  Excessive amounts of blood in the stool  Significant tenderness or worsening of abdominal pains  Swelling of the abdomen that is new, acute  Fever of 100F or higher   Following upper endoscopy (EGD)  Vomiting of blood or coffee ground material  New chest pain or pain under the shoulder blades  Painful or persistently difficult swallowing  New shortness of breath  Fever of 100F or higher  Black, tarry-looking stools  For urgent or emergent issues, a gastroenterologist can be reached at any hour by calling [phone]. Do not use MyChart messaging for urgent concerns.    DIET:  We do recommend a small meal at first, but  then you may proceed to your regular diet.  Drink plenty of fluids but you should avoid alcoholic beverages for 24 hours.  ACTIVITY:  You should plan to take it easy for the rest of today and you should NOT DRIVE or use heavy machinery until tomorrow (because of the sedation medicines used during the test).    FOLLOW UP: Our staff will call the number listed on your records 48-72 hours following your procedure to check on you and address any questions or concerns that you may have regarding the information given to you following your procedure. If we do not reach you, we will leave a message.  We will attempt to reach you two times.  During this call, we will ask if you have developed any symptoms of COVID 19. If you develop any symptoms (ie: fever, flu-like symptoms, shortness of breath, cough etc.) before then, please call [phone].  If you test positive for Covid 19 in the 2 weeks post procedure, please call and report this information to us.    If any biopsies were taken you will be contacted by phone or by letter within the next 1-3 weeks.  Please call us at [phone] if you have not heard about the biopsies in 3 weeks.    SIGNATURES/CONFIDENTIALITY: You and/or your care partner have signed paperwork which will be entered into your electronic medical record.  These signatures attest to the fact that that the information above on   your After Visit Summary has been reviewed and is understood.  Full responsibility of the confidentiality of this discharge information lies with you and/or your care-partner. 

## 2020-04-23 NOTE — Progress Notes (Signed)
To PACU, VSS. Report to [email or web address] 

## 2020-04-23 NOTE — Progress Notes (Signed)
Called to room to assist during endoscopic procedure.  Patient ID and intended procedure confirmed with present staff. Received instructions for my participation in the procedure from the performing physician.  

## 2020-04-23 NOTE — Progress Notes (Signed)
VS done by SM.

## 2020-04-25 ENCOUNTER — Other Ambulatory Visit: Payer: Self-pay | Admitting: Gastroenterology

## 2020-04-25 ENCOUNTER — Telehealth: Payer: Self-pay

## 2020-04-25 DIAGNOSIS — D125 Benign neoplasm of sigmoid colon: Secondary | ICD-10-CM

## 2020-04-25 DIAGNOSIS — K921 Melena: Secondary | ICD-10-CM

## 2020-04-25 DIAGNOSIS — K5730 Diverticulosis of large intestine without perforation or abscess without bleeding: Secondary | ICD-10-CM

## 2020-04-25 NOTE — Telephone Encounter (Signed)
  Follow up Call-  Call back number 04/23/2020  Post procedure Call Back phone  # [phone]  Permission to leave phone message Yes  Some recent data might be hidden     Patient questions:  Do you have a fever, pain , or abdominal swelling? No. Pain Score  0 *  Have you tolerated food without any problems? Yes.    Have you been able to return to your normal activities? Yes.    Do you have any questions about your discharge instructions: Diet   No. Medications  No. Follow up visit  No.  Do you have questions or concerns about your Care? Yes.    Actions: * If pain score is 4 or above: No action needed, pain <4. 1. Have you developed a fever since your procedure? no  2.   Have you had an respiratory symptoms (SOB or cough) since your procedure? no  3.   Have you tested positive for COVID 19 since your procedure no  4.   Have you had any family members/close contacts diagnosed with the COVID 19 since your procedure?  no   If yes to any of these questions please route to Joylene John, RN and Joella Prince, RN

## 2020-05-20 ENCOUNTER — Ambulatory Visit (INDEPENDENT_AMBULATORY_CARE_PROVIDER_SITE_OTHER): Payer: 59 | Admitting: Gastroenterology

## 2020-05-20 ENCOUNTER — Encounter: Payer: Self-pay | Admitting: Gastroenterology

## 2020-05-20 VITALS — BP 118/82 | HR 83 | Ht 66.0 in | Wt 167.1 lb

## 2020-05-20 DIAGNOSIS — K582 Mixed irritable bowel syndrome: Secondary | ICD-10-CM | POA: Diagnosis not present

## 2020-05-20 DIAGNOSIS — K219 Gastro-esophageal reflux disease without esophagitis: Secondary | ICD-10-CM

## 2020-05-20 DIAGNOSIS — R140 Abdominal distension (gaseous): Secondary | ICD-10-CM | POA: Diagnosis not present

## 2020-05-20 DIAGNOSIS — K648 Other hemorrhoids: Secondary | ICD-10-CM

## 2020-05-20 MED ORDER — DICYCLOMINE HCL 20 MG PO TABS: 20.0000 mg | ORAL_TABLET | Freq: Three times a day (TID) | ORAL | 5 refills | Status: DC | PRN

## 2020-05-20 NOTE — Progress Notes (Signed)
P  Chief Complaint:    Symptomatic Internal Hemorrhoids; Hemorrhoid Band Ligation  GI History: 39 year old female with a history of anxiety, migraines who follows in the GI clinic for the following:  1) IBS-mixed type: Several year history of alternating bowel habits and abdominal bloating. More typically diarrhea/increased stool frequency but can also have alternating constipation. Constipation tends to aggravate hemorrhoids. Uses probiotic (called "bye-bye bloating") with improvement of bloating. Trialed low FODMAP diet in 06/2019. -06/2019: Celiac serology negative -01/2020: Normal TSH, CMP -04/2020: ESR 31, CRP 46.  Normal fecal calprotectin and GI PCR panel  2) GERD: Index symptoms of heartburn and regurgitation. Improved with omeprazole.  Has been controlled with diet/lifestyle modifications until 03/2020 with recurrence of regurgitation and heartburn.  Sleeps with HOB elevated, avoids eating close to bedtime.  Improving on omeprazole 40 mg/day.  3) Symptomatic hemorrhoids: Hemorrhoidectomy in her early 73s. Occasional small-volume BRBPR. -04/23/2020: Colonoscopy as below -05/20/2020: Presents for hemorrhoid banding #1  -03/05/2019: H/H 11.6/34.4, MCV/RDW 91/13 -06/07/2019: Normal iron panel, negative/normal celiac serology -03/06/2020: H/H 11.7/34.9, MCV/RDW 90/13. Normal vitamin D, A1c  Endoscopic History: -EGD (04/2020, Dr. Bryan Lemma): Normal, Hill grade 2.  Duodenal biopsies negative, gastric biopsies with non-H. pylori gastritis -Colonoscopy (04/2020, Dr. Bryan Lemma): HP x2, sigmoid diverticulosis, otherwise normal biopsies negative for [facility].  Normal TI.  Repeat 10 years  HPI:     Patient is a 39 y.o. femalewith a history of symptomatic internal hemorrhoids presenting to the Gastroenterology Clinic for follow-up and ongoing treatment. The patient presents with symptomatic grade 1-2 hemorrhoids, unresponsive to maximal medical therapy, requesting rubber band ligation of  symptomatic hemorrhoidal disease.  Was last seen in the GI clinic on 04/11/2020 for the above issues.  Since then completed EGD/colonoscopy as outlined above.  Continues to have IBS symptoms, most bothersome being abdominal bloating.  Symptoms worse during the week, and tend to abate on the weekend.  Repeat ESR/CRP previously ordered.  No change in medical or surgical history, medications, allergies, social history since last appointment with me.   Review of systems:     No chest pain, no SOB, no fevers, no urinary sx   Past Medical History:  Diagnosis Date  . Allergy   . Anemia   . Anxiety   . Chicken pox   . Dysplastic nevus 02/09/2017   Mid back spinal at bra line. Mild atypia, lateral and deep margins involved.  Marland Kitchen Dysplastic nevus 02/09/2017   Right proximal post. lat. thigh. Mild atypia, close to margin.  . Frequent headaches   . IBS (irritable bowel syndrome)     Patient's surgical history, family medical history, social history, medications and allergies were all reviewed in Epic    Current Outpatient Medications  Medication Sig Dispense Refill  . busPIRone (BUSPAR) 5 MG tablet Take 1 tablet (5 mg total) by mouth 2 (two) times daily as needed. (Patient taking differently: Take 5 mg by mouth as needed. ) 60 tablet 0  . Galcanezumab-gnlm (EMGALITY) 120 MG/ML SOSY Inject 120 mg into the skin every 28 (twenty-eight) days.    Lenda Kelp FE 1/20 1-20 MG-MCG tablet Take 1 tablet by mouth daily.  3  . Multiple Vitamin (MULTI-VITAMINS) TABS Take by mouth.    Marland Kitchen omeprazole (PRILOSEC) 40 MG capsule TAKE 1 CAPSULE BY MOUTH EVERY DAY (Patient taking differently: Take 40 mg by mouth as needed. ) 90 capsule 1  . OVER THE COUNTER MEDICATION Sambucus Elderberry 500mg , zinc 5mg  and Vit C 90mg  - one daily    .  Probiotic Product (PROBIOTIC PO) Take by mouth. BYE BYE Bloat- one daily    . promethazine (PHENERGAN) 25 MG tablet Take 25 mg by mouth as needed.     . SUMAtriptan (IMITREX) 100 MG tablet  Take 1 tablet by mouth as needed. For migraine may repeat in 2hrs if not better Max 2/24hr  2  . SUMAtriptan 6 MG/0.5ML SOAJ as needed.   3   No current facility-administered medications for this visit.    Physical Exam:     BP 118/82 (BP Location: Right Arm, Patient Position: Sitting, Cuff Size: Normal)   Pulse 83   Ht $R'5\' 6"'Sb$  (1.676 m)   Wt 167 lb 2 oz (75.8 kg)   BMI 26.97 kg/m   GENERAL:  Pleasant female in NAD PSYCH: : Cooperative, normal affect NEURO: Alert and oriented x 3, no focal neurologic deficits Rectal exam: Sensation intact and preserved anal wink.   To skin tags.  Grade 1-2 hemorrhoids noted in all positions on anoscopy.  No external anal fissures noted. Normal sphincter tone. No palpable mass. No blood on the exam glove. (Chaperone: Lanny Hurst, CMA).   IMPRESSION and PLAN:    #1.  Symptomatic internal hemorrhoids: PROCEDURE NOTE: The patient presents with symptomatic grade 1-2 hemorrhoids, unresponsive to maximal medical therapy, requesting rubber band ligation of symptomatic hemorrhoidal disease.  All risks, benefits and alternative forms of therapy were described and informed consent was obtained.  In the Left Lateral Decubitus position, anoscopic examination revealed grade 1-hemorrhoids in the all position(s).  The anorectum was pre-medicated with RectiCare. The decision was made to band the LL internal hemorrhoid, and the Alamillo was used to perform band ligation without complication.  Digital anorectal examination was then performed to assure proper positioning of the band, and to adjust the banded tissue as required.  The patient was discharged home without pain or other issues.  Dietary and behavioral recommendations were given and along with follow-up instructions.     The following adjunctive treatments were recommended:  -Resume high-fiber diet with fiber supplement (i.e. Citrucel or Benefiber) with goal for soft stools without straining to  have a BM. -Resume adequate fluid intake.  The patient will return in 2-4 for  follow-up and possible additional banding as required. No complications were encountered and the patient tolerated the procedure well.      #2.  IBS mixed type #3. Abdominal bloating -Trial course of Bentyl 20 mg prn -Discussed antispasmodicvs trial of neuromodulation with TCA or SSRI, and she opted for trial of Bentyl -Continue current therapy -Did have elevated inflammatory markers, but no evidence of colitis on recent colonoscopy -Repeat ESR/CRP as previously ordered  #4.  GERD -Well-controlled on current therapy -Continue antireflux lifestyle/dietary modifications      Lavena Bullion ,DO, FACG 05/20/2020, 3:57 PM

## 2020-05-20 NOTE — Patient Instructions (Addendum)
If you are age 41 or older, your body mass index should be between 23-30. Your Body mass index is 26.97 kg/m. If this is out of the aforementioned range listed, please consider follow up with your Primary Care Provider.  If you are age 67 or younger, your body mass index should be between 19-25. Your Body mass index is 26.97 kg/m. If this is out of the aformentioned range listed, please consider follow up with your Primary Care Provider.   HEMORRHOID BANDING PROCEDURE    FOLLOW-UP CARE   1. The procedure you have had should have been relatively painless since the banding of the area involved does not have nerve endings and there is no pain sensation.  The rubber band cuts off the blood supply to the hemorrhoid and the band may fall off as soon as 48 hours after the banding (the band may occasionally be seen in the toilet bowl following a bowel movement). You may notice a temporary feeling of fullness in the rectum which should respond adequately to plain Tylenol or Motrin.  2. Following the banding, avoid strenuous exercise that evening and resume full activity the next day.  A sitz bath (soaking in a warm tub) or bidet is soothing, and can be useful for cleansing the area after bowel movements.     3. To avoid constipation, take two tablespoons of natural wheat bran, natural oat bran, flax, Benefiber or any over the counter fiber supplement and increase your water intake to 7-8 glasses daily.    4. Unless you have been prescribed anorectal medication, do not put anything inside your rectum for two weeks: No suppositories, enemas, fingers, etc.  5. Occasionally, you may have more bleeding than usual after the banding procedure.  This is often from the untreated hemorrhoids rather than the treated one.  Don't be concerned if there is a tablespoon or so of blood.  If there is more blood than this, lie flat with your bottom higher than your head and apply an ice pack to the area. If the bleeding  does not stop within a half an hour or if you feel faint, call our office at [phone] or go to the emergency room.  6. Problems are not common; however, if there is a substantial amount of bleeding, severe pain, chills, fever or difficulty passing urine (very rare) or other problems, you should call us at [phone] or report to the nearest emergency room.  7. Do not stay seated continuously for more than 2-3 hours for a day or two after the procedure.  Tighten your buttock muscles 10-15 times every two hours and take 10-15 deep breaths every 1-2 hours.  Do not spend more than a few minutes on the toilet if you cannot empty your bowel; instead re-visit the toilet at a later time.    We have sent the following medications to your pharmacy for you to pick up at your convenience:  Bentyl 20 mg   Follow up in 4 weeks for your second banding appointment 06/25/2020 @ 9:40am  Thank you for choosing me and [facility].  Vito Cirigliano, D.O.

## 2020-05-27 ENCOUNTER — Other Ambulatory Visit: Payer: Self-pay | Admitting: Internal Medicine

## 2020-05-27 IMAGING — MG MM DIGITAL SCREENING BILAT W/ TOMO W/ CAD
6 of 10 series · 6 of 30 positions shown · non-contrast
Comparison: None.

CLINICAL DATA: Screening.

EXAM:
DIGITAL SCREENING BILATERAL MAMMOGRAM WITH TOMO AND CAD

[R MLO synth-2D]
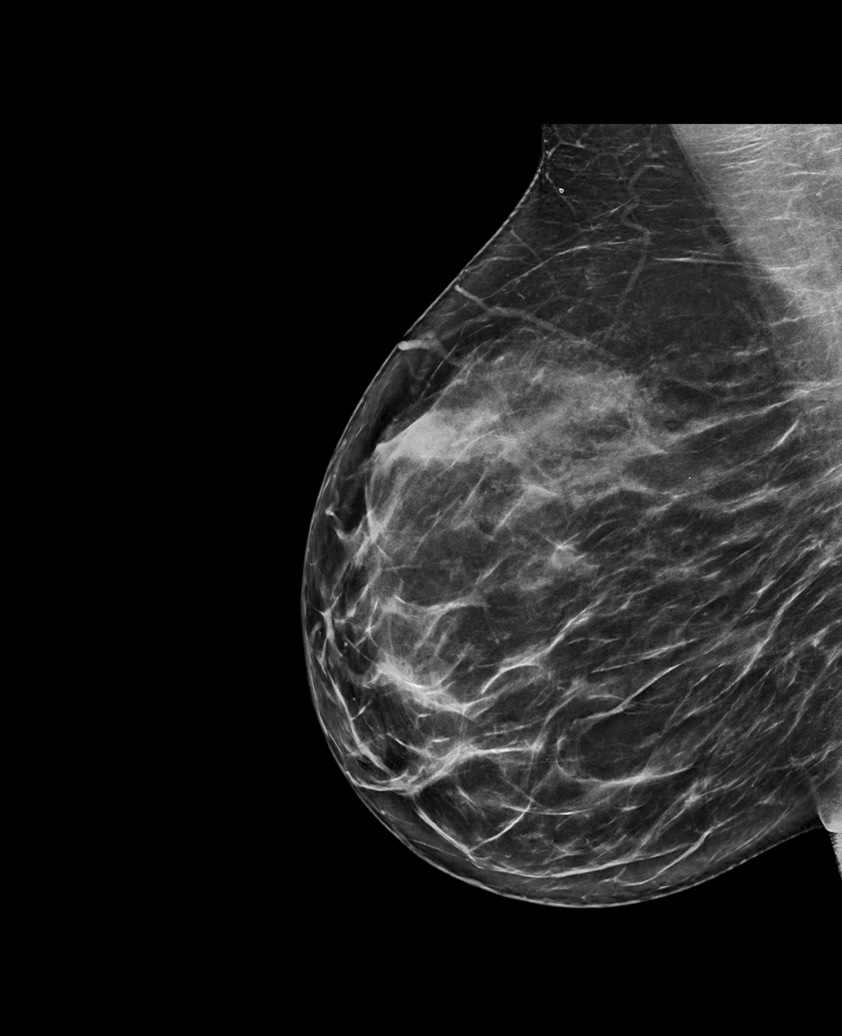

[L CC synth-2D]
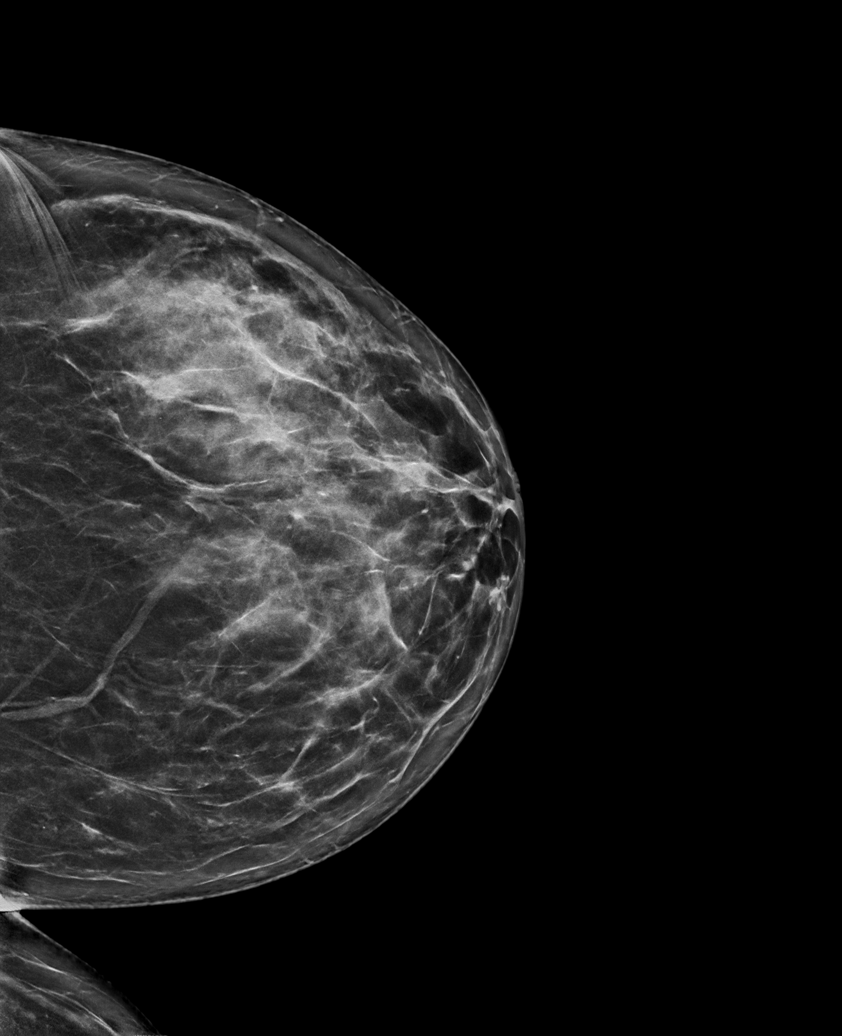

[R CC synth-2D (1 of 2)]
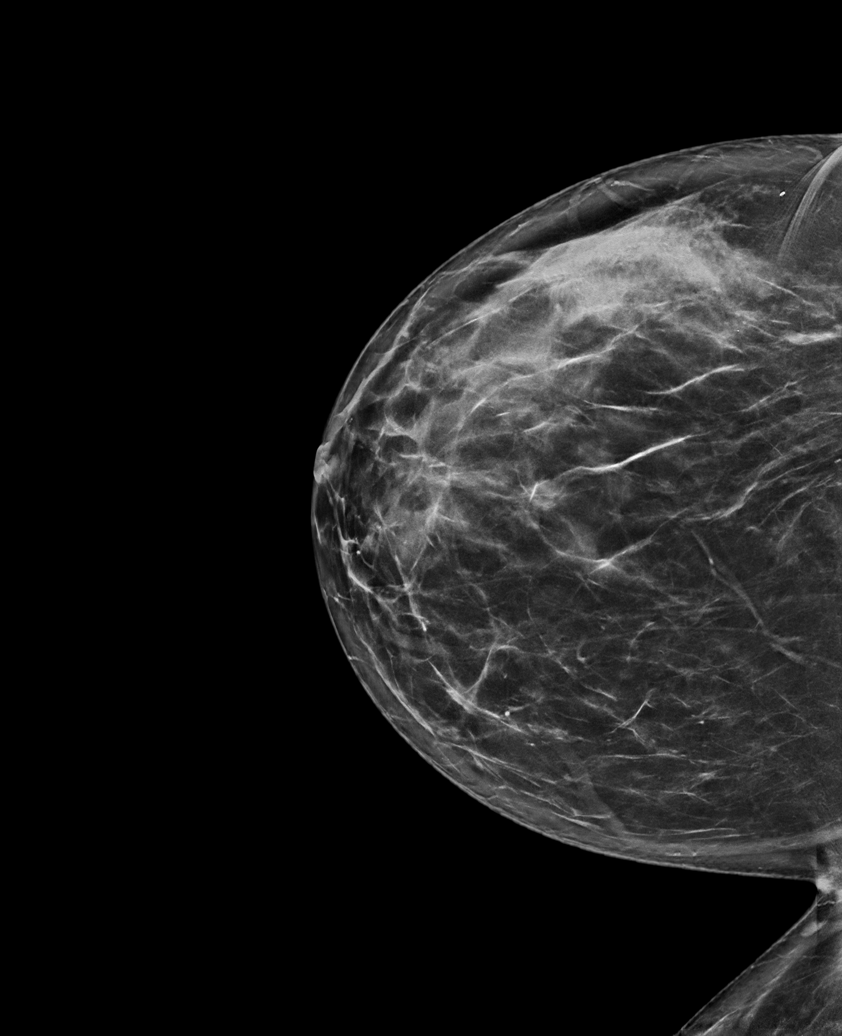

[R CC synth-2D (2 of 2)]
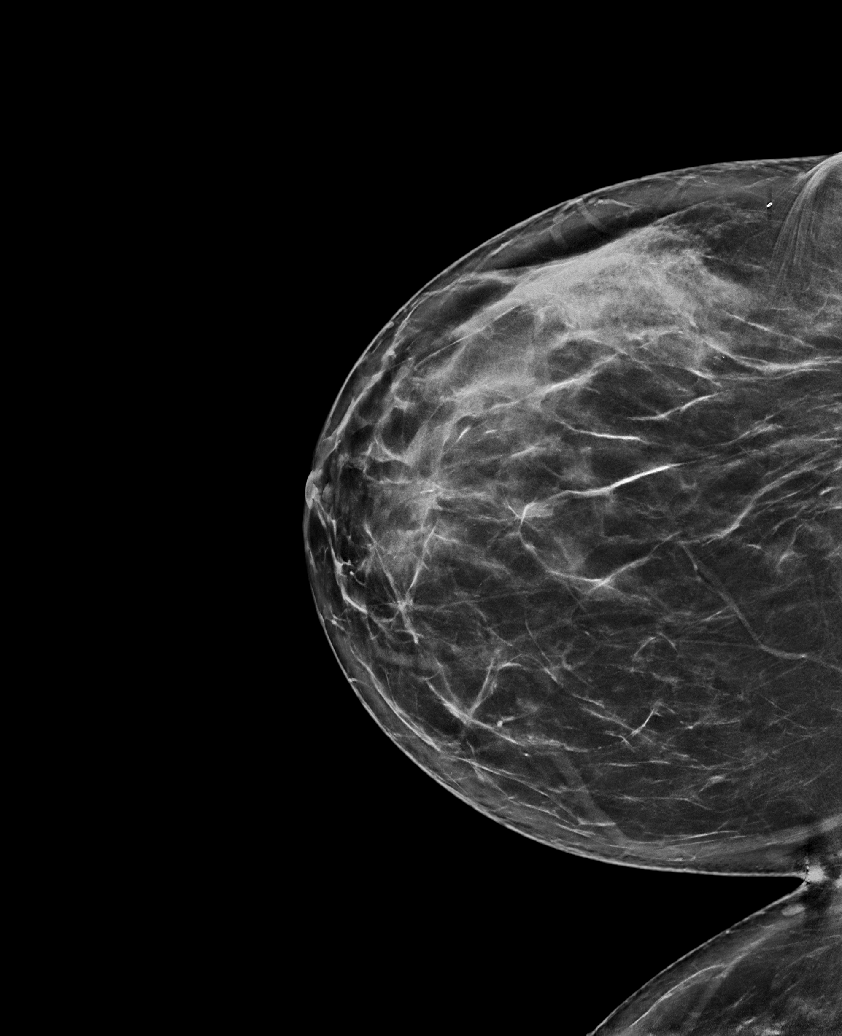

[L MLO synth-2D]
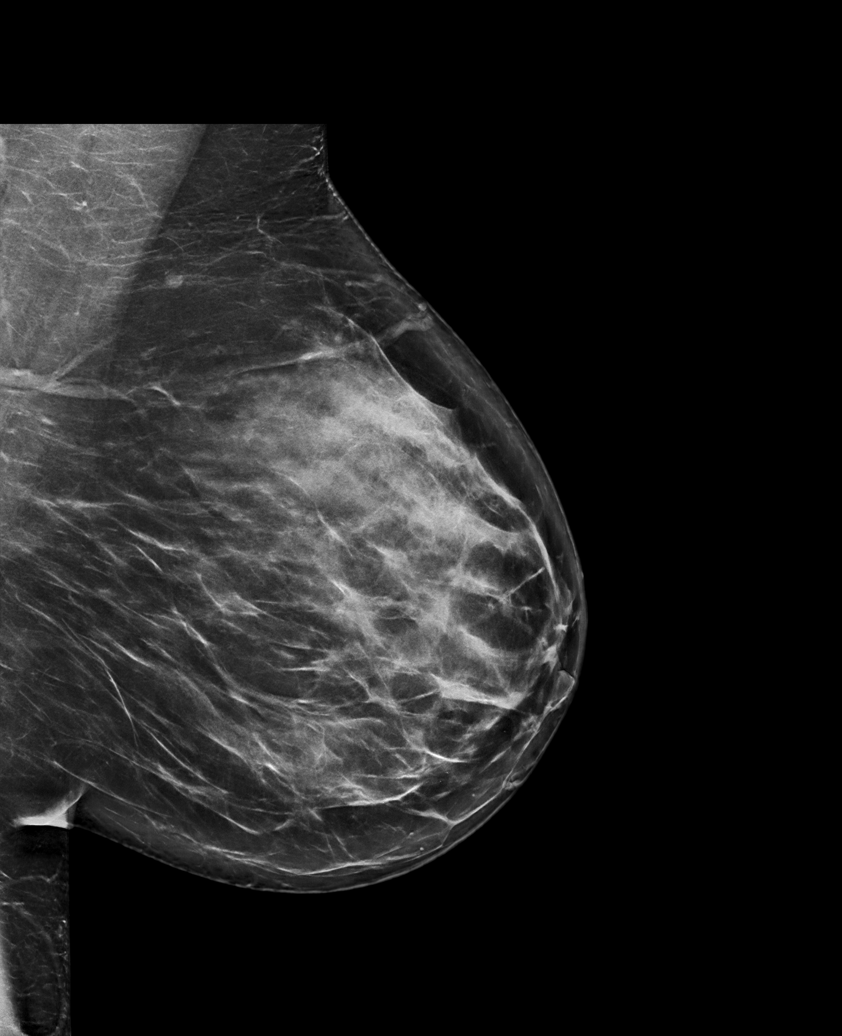

[L CC tomo · tomo slice 39/76.0]
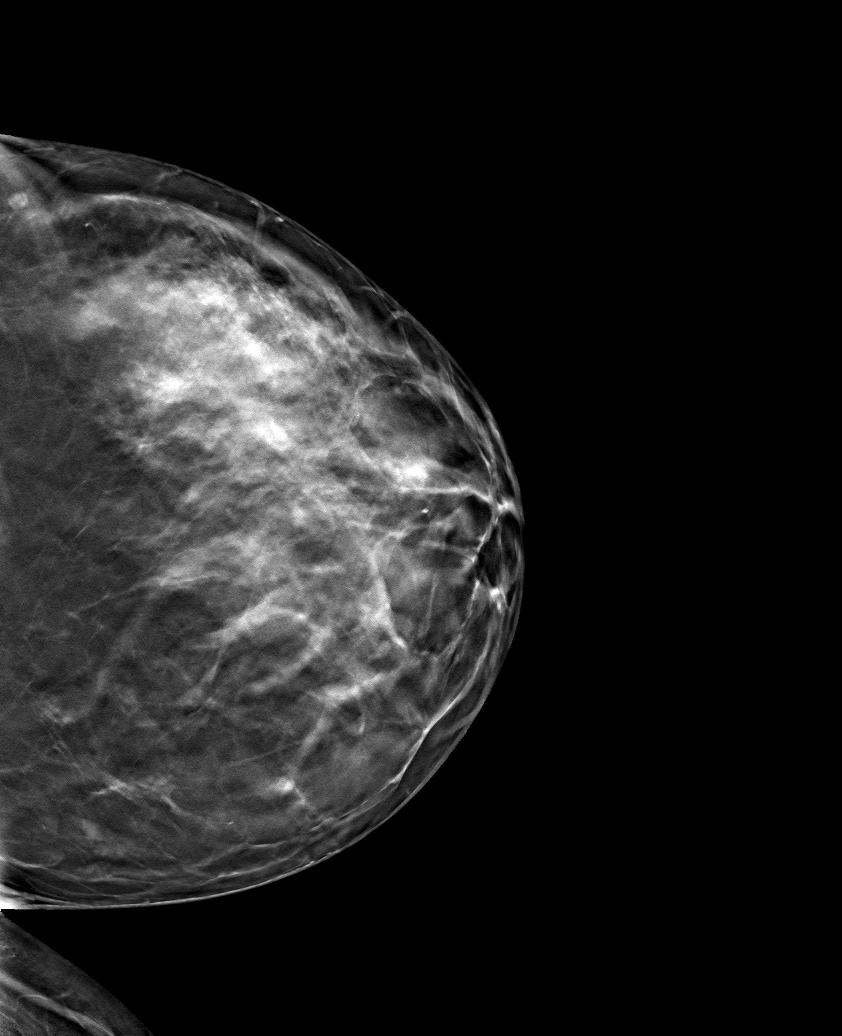

[6 of 30 positions shown; findings below may reference images not displayed]

ACR Breast Density Category c: The breast tissue is heterogeneously
dense, which may obscure small masses
FINDINGS: There are no findings suspicious for malignancy. Images were
processed with CAD.
IMPRESSION: No mammographic evidence of malignancy. A result letter of this
screening mammogram will be mailed directly to the patient.

RECOMMENDATION:
Screening mammogram in one year. (Code:EM-2-IHY)

BI-RADS CATEGORY  1: Negative.

## 2020-06-06 ENCOUNTER — Ambulatory Visit (HOSPITAL_COMMUNITY): Payer: 59 | Admitting: Anesthesiology

## 2020-06-06 ENCOUNTER — Other Ambulatory Visit: Payer: Self-pay

## 2020-06-06 ENCOUNTER — Encounter (HOSPITAL_COMMUNITY): Payer: Self-pay | Admitting: Gastroenterology

## 2020-06-06 ENCOUNTER — Telehealth: Payer: Self-pay | Admitting: Internal Medicine

## 2020-06-06 ENCOUNTER — Encounter (HOSPITAL_COMMUNITY)
Admission: AD | Disposition: A | Discharge status: Home / Self Care | Payer: Self-pay | Source: Ambulatory Visit | Attending: Gastroenterology

## 2020-06-06 ENCOUNTER — Ambulatory Visit (HOSPITAL_COMMUNITY)
Admission: AD | Admit: 2020-06-06 | Discharge: 2020-06-06 | Disposition: A | Discharge status: Home / Self Care | Payer: 59 | Source: Ambulatory Visit | Attending: Gastroenterology | Admitting: Gastroenterology

## 2020-06-06 DIAGNOSIS — K921 Melena: Secondary | ICD-10-CM | POA: Diagnosis present

## 2020-06-06 DIAGNOSIS — K649 Unspecified hemorrhoids: Secondary | ICD-10-CM | POA: Insufficient documentation

## 2020-06-06 DIAGNOSIS — Z823 Family history of stroke: Secondary | ICD-10-CM | POA: Diagnosis not present

## 2020-06-06 DIAGNOSIS — Z8261 Family history of arthritis: Secondary | ICD-10-CM | POA: Insufficient documentation

## 2020-06-06 DIAGNOSIS — Z20822 Contact with and (suspected) exposure to covid-19: Secondary | ICD-10-CM | POA: Insufficient documentation

## 2020-06-06 DIAGNOSIS — K641 Second degree hemorrhoids: Secondary | ICD-10-CM

## 2020-06-06 DIAGNOSIS — Z800 Family history of malignant neoplasm of digestive organs: Secondary | ICD-10-CM | POA: Diagnosis not present

## 2020-06-06 DIAGNOSIS — Z79899 Other long term (current) drug therapy: Secondary | ICD-10-CM | POA: Insufficient documentation

## 2020-06-06 DIAGNOSIS — Z8041 Family history of malignant neoplasm of ovary: Secondary | ICD-10-CM | POA: Diagnosis not present

## 2020-06-06 DIAGNOSIS — Z8349 Family history of other endocrine, nutritional and metabolic diseases: Secondary | ICD-10-CM | POA: Insufficient documentation

## 2020-06-06 DIAGNOSIS — K626 Ulcer of anus and rectum: Secondary | ICD-10-CM | POA: Diagnosis not present

## 2020-06-06 DIAGNOSIS — K589 Irritable bowel syndrome without diarrhea: Secondary | ICD-10-CM | POA: Insufficient documentation

## 2020-06-06 DIAGNOSIS — Z8249 Family history of ischemic heart disease and other diseases of the circulatory system: Secondary | ICD-10-CM | POA: Insufficient documentation

## 2020-06-06 HISTORY — PX: FLEXIBLE SIGMOIDOSCOPY: SHX5431

## 2020-06-06 HISTORY — PX: HEMOSTASIS CLIP PLACEMENT: SHX6857

## 2020-06-06 LAB — CBC
HCT: 37.9 % (ref 36.0–46.0)
Hemoglobin: 12.6 g/dL (ref 12.0–15.0)
MCH: 30 pg (ref 26.0–34.0)
MCHC: 33.2 g/dL (ref 30.0–36.0)
MCV: 90.2 fL (ref 80.0–100.0)
Platelets: 274 10*3/uL (ref 150–400)
RBC: 4.2 MIL/uL (ref 3.87–5.11)
RDW: 12.4 % (ref 11.5–15.5)
WBC: 9.9 10*3/uL (ref 4.0–10.5)
nRBC: 0 % (ref 0.0–0.2)

## 2020-06-06 LAB — POCT PREGNANCY, URINE: Preg Test, Ur: NEGATIVE

## 2020-06-06 LAB — SARS CORONAVIRUS 2 BY RT PCR (HOSPITAL ORDER, PERFORMED IN ~~LOC~~ HOSPITAL LAB): SARS Coronavirus 2: NEGATIVE

## 2020-06-06 SURGERY — SIGMOIDOSCOPY, FLEXIBLE
Anesthesia: General

## 2020-06-06 MED ORDER — SODIUM CHLORIDE 0.9 % IV SOLN: INTRAVENOUS | Status: DC

## 2020-06-06 MED ORDER — DEXMEDETOMIDINE HCL 200 MCG/2ML IV SOLN
INTRAVENOUS | Status: DC | PRN
  Administered 2020-06-06 (×2): 8 ug via INTRAVENOUS

## 2020-06-06 MED ORDER — PROPOFOL 10 MG/ML IV BOLUS
INTRAVENOUS | Status: DC | PRN
  Administered 2020-06-06: 10 mg via INTRAVENOUS
  Administered 2020-06-06 (×2): 11 mg via INTRAVENOUS

## 2020-06-06 MED ORDER — LIDOCAINE HCL (CARDIAC) PF 100 MG/5ML IV SOSY
PREFILLED_SYRINGE | INTRAVENOUS | Status: DC | PRN
  Administered 2020-06-06: 50 mg via INTRAVENOUS

## 2020-06-06 MED ORDER — PROPOFOL 500 MG/50ML IV EMUL
INTRAVENOUS | Status: DC | PRN
  Administered 2020-06-06: 100 ug/kg/min via INTRAVENOUS

## 2020-06-06 NOTE — H&P (Signed)
Consultation  Primary Care Physician:  Jearld Fenton, NP Primary Gastroenterologist:     Dr. Bryan Lemma scan Reason for Consultation:     Hematochezia         HPI:   Robin West is a 39 y.o. female with a history of symptomatic internal hemorrhoids s/p hemorrhoid banding on 05/20/2020 (LL hemorrhoid was banded).  Procedure completed without any issue.  Had been doing well post procedure with plan for repeat banding mid December.  However, she developed acute onset hematochezia yesterday with BRBPR and blood clots.  Had several episodes yesterday afternoon and evening.  Contacted on-call GI.  Does have associated lightheadedness, but feeling better currently.  Otherwise no shortness of breath, chest pain, nausea/vomiting, fever, chills.  Endoscopic History: -EGD (04/2020, Dr. Bryan Lemma): Normal, Hill grade 2.  Duodenal biopsies negative, gastric biopsies with non-H. pylori gastritis -Colonoscopy (04/2020, Dr. Bryan Lemma): HP x2, sigmoid diverticulosis, otherwise normal biopsies negative for [facility].  Normal TI.  Repeat 10 years  -03/05/2019: H/H 11.6/34.4, MCV/RDW 91/13 -06/07/2019: Normal iron panel, negative/normal celiac serology -03/06/2020: H/H 11.7/34.9, MCV/RDW 90/13. Normal vitamin D, A1c  Past Medical History:  Diagnosis Date  . Allergy   . Anemia   . Anxiety   . Chicken pox   . Dysplastic nevus 02/09/2017   Mid back spinal at bra line. Mild atypia, lateral and deep margins involved.  Marland Kitchen Dysplastic nevus 02/09/2017   Right proximal post. lat. thigh. Mild atypia, close to margin.  . Frequent headaches   . IBS (irritable bowel syndrome)     Past Surgical History:  Procedure Laterality Date  . GANGLION CYST EXCISION Left 01/08/2016   Procedure: EXCISION DORSAL GANGLION LEFT WRIST;  Surgeon: Daryll Brod, MD;  Location: [facility];  Service: Orthopedics;  Laterality: Left;  . HEMORROIDECTOMY  2004  . TONSILLECTOMY  2010  . WISDOM TOOTH EXTRACTION      Family  History  Problem Relation Age of Onset  . Arthritis Father   . Hyperlipidemia Father   . Hypertension Father   . Arthritis Maternal Grandmother   . Stroke Maternal Grandfather   . Cancer Paternal Grandmother        Ovarian   . Cancer Paternal Aunt        ovarian cancer  . Cancer Paternal Uncle   . Esophageal cancer Maternal Uncle   . Colon cancer Neg Hx   . Stomach cancer Neg Hx   . Rectal cancer Neg Hx      Social History   Tobacco Use  . Smoking status: Never Smoker  . Smokeless tobacco: Never Used  Vaping Use  . Vaping Use: Never used  Substance Use Topics  . Alcohol use: Yes    Alcohol/week: 0.0 standard drinks    Comment: rare  . Drug use: No    Prior to Admission medications   Medication Sig Start Date End Date Taking? Authorizing Provider  busPIRone (BUSPAR) 5 MG tablet TAKE 1 TABLET BY MOUTH 2 TIMES DAILY AS NEEDED. 05/28/20   Jearld Fenton, NP  dicyclomine (BENTYL) 20 MG tablet Take 1 tablet (20 mg total) by mouth every 8 (eight) hours as needed for spasms. 05/20/20   Kashius Dominic V, DO  Galcanezumab-gnlm (EMGALITY) 120 MG/ML SOSY Inject 120 mg into the skin every 28 (twenty-eight) days.    [provider]  JUNEL FE 1/20 1-20 MG-MCG tablet Take 1 tablet by mouth daily. 05/19/17   [provider]  Multiple Vitamin (MULTI-VITAMINS) TABS Take by  mouth.    [provider]  omeprazole (PRILOSEC) 40 MG capsule TAKE 1 CAPSULE BY MOUTH EVERY DAY Patient taking differently: Take 40 mg by mouth as needed.  08/27/19   Jearld Fenton, NP  OVER THE COUNTER MEDICATION Sambucus Elderberry 500mg , zinc 5mg  and Vit C 90mg  - one daily    [provider]  Probiotic Product (PROBIOTIC PO) Take by mouth. BYE BYE Bloat- one daily    [provider]  promethazine (PHENERGAN) 25 MG tablet Take 25 mg by mouth as needed.  11/20/15   [provider]  SUMAtriptan (IMITREX) 100 MG tablet Take 1 tablet by mouth as needed. For migraine  may repeat in 2hrs if not better Max 2/24hr 09/18/14   [provider]  SUMAtriptan 6 MG/0.5ML SOAJ as needed.  11/21/15   [provider]    No current facility-administered medications for this encounter.    Allergies as of 06/06/2020  . (No Known Allergies)     Review of Systems:    As per HPI, otherwise negative    Physical Exam:  Vital signs in last 24 hours:     General:   Pleasant female in NAD Head:  Normocephalic and atraumatic. Eyes:   No icterus.   Conjunctiva pink. Ears:  Normal auditory acuity. Neck:  Supple Lungs:  Respirations even and unlabored. Lungs clear to auscultation bilaterally.   No wheezes, crackles, or rhonchi.  Heart:  Regular rate and rhythm; no MRG Abdomen:  Soft, nondistended, nontender. Normal bowel sounds. No appreciable masses or hepatomegaly.  Rectal:  Not performed.  Msk:  Symmetrical without gross deformities.  Extremities:  Without edema. Neurologic:  Alert and  oriented x4;  grossly normal neurologically. Skin:  Intact without significant lesions or rashes. Psych:  Alert and cooperative. Normal affect.  LAB RESULTS: No results for input(s): WBC, HGB, HCT, PLT in the last 72 hours. BMET No results for input(s): NA, K, CL, CO2, GLUCOSE, BUN, CREATININE, CALCIUM in the last 72 hours. LFT No results for input(s): PROT, ALBUMIN, AST, ALT, ALKPHOS, BILITOT, BILIDIR, IBILI in the last 72 hours. PT/INR No results for input(s): LABPROT, INR in the last 72 hours.  STUDIES: No results found.    Impression / Plan:   1) Hematochezia 2) History of internal hemorrhoids with prior hemorrhoid banding x1  -Plan for urgent flexible sigmoidoscopy for therapeutic intent -Covid test pending -Obtain CBC stat -Additional recommendations after flexible sigmoidoscopy  The indications, risks, and benefits of flexible sigmoidoscopy were explained to the patient in detail. Risks include but are not limited to bleeding, perforation,  adverse reaction to medications, and cardiopulmonary compromise. Sequelae include but are not limited to the possibility of surgery, hospitalization, and mortality. The patient verbalized understanding and wished to proceed. All questions answered.   Gerrit Heck, DO, [facility]    LOS: 0 days   Robin West  06/06/2020, 10:51 AM

## 2020-06-06 NOTE — Anesthesia Preprocedure Evaluation (Addendum)
Anesthesia Evaluation  Patient identified by MRN, date of birth, ID band Patient awake    Reviewed: Allergy & Precautions, NPO status , Patient's Chart, lab work & pertinent test results  Airway Mallampati: I  TM Distance: >3 FB Neck ROM: Full    Dental no notable dental hx.    Pulmonary neg pulmonary ROS,    Pulmonary exam normal breath sounds clear to auscultation       Cardiovascular negative cardio ROS Normal cardiovascular exam Rhythm:Regular Rate:Normal     Neuro/Psych  Headaches, Anxiety    GI/Hepatic Neg liver ROS, GERD  Medicated and Controlled,IBS (irritable bowel syndrome)   Endo/Other  negative endocrine ROS  Renal/GU negative Renal ROS     Musculoskeletal negative musculoskeletal ROS (+)   Abdominal   Peds  Hematology negative hematology ROS (+)   Anesthesia Other Findings rectal bleeding  Reproductive/Obstetrics                            Anesthesia Physical Anesthesia Plan  ASA: I  Anesthesia Plan: MAC   Post-op Pain Management:    Induction: Intravenous  PONV Risk Score and Plan: 2 and Propofol infusion and Treatment may vary due to age or medical condition  Airway Management Planned: Simple Face Mask  Additional Equipment:   Intra-op Plan:   Post-operative Plan:   Informed Consent: I have reviewed the patients History and Physical, chart, labs and discussed the procedure including the risks, benefits and alternatives for the proposed anesthesia with the patient or authorized representative who has indicated his/her understanding and acceptance.     Dental advisory given  Plan Discussed with: CRNA  Anesthesia Plan Comments:        Anesthesia Quick Evaluation

## 2020-06-06 NOTE — Discharge Instructions (Signed)
YOU HAD AN ENDOSCOPIC PROCEDURE TODAY: Refer to the procedure report and other information in the discharge instructions given to you for any specific questions about what was found during the examination. If this information does not answer your questions, please call [facility] office at [phone] to clarify.   YOU SHOULD EXPECT: Some feelings of bloating in the abdomen. Passage of more gas than usual. Walking can help get rid of the air that was put into your GI tract during the procedure and reduce the bloating. If you had a lower endoscopy (such as a colonoscopy or flexible sigmoidoscopy) you may notice spotting of blood in your stool or on the toilet paper. Some abdominal soreness may be present for a day or two, also.  DIET: Your first meal following the procedure should be a light meal and then it is ok to progress to your normal diet. A half-sandwich or bowl of soup is an example of a good first meal. Heavy or fried foods are harder to digest and may make you feel nauseous or bloated. Drink plenty of fluids but you should avoid alcoholic beverages for 24 hours. If you had a esophageal dilation, please see attached instructions for diet.    ACTIVITY: Your care partner should take you home directly after the procedure. You should plan to take it easy, moving slowly for the rest of the day. You can resume normal activity the day after the procedure however YOU SHOULD NOT DRIVE, use power tools, machinery or perform tasks that involve climbing or major physical exertion for 24 hours (because of the sedation medicines used during the test).   SYMPTOMS TO REPORT IMMEDIATELY: A gastroenterologist can be reached at any hour. Please call [phone]  for any of the following symptoms:  . Following lower endoscopy ( flexible sigmoidoscopy) Excessive amounts of blood in the stool  Significant tenderness, worsening of abdominal pains  Swelling of the abdomen that is new, acute  Fever of 100 or higher    FOLLOW UP:  If any biopsies were taken you will be contacted by phone or by letter within the next 1-3 weeks. Call [phone]  if you have not heard about the biopsies in 3 weeks.  Please also call with any specific questions about appointments or follow up tests.

## 2020-06-06 NOTE — Op Note (Signed)
[facility] Patient Name: Robin West Procedure Date : 06/06/2020 MRN: [id number] Attending MD: Gerrit Heck , MD Date of Birth: 1981/05/23 CSN: [id number] Age: 39 Admit Type: Outpatient Procedure:                Flexible Sigmoidoscopy Indications:              Hematochezia Providers:                Gerrit Heck, MD, Erenest Rasher, RN, Elspeth Cho Tech., Technician, Lesia Sago,                            Technician, Virgilio Belling. Beckner, CRNA Referring MD:              Medicines:                Monitored Anesthesia Care Complications:            No immediate complications. Estimated Blood Loss:     Estimated blood loss: none. Procedure:                Pre-Anesthesia Assessment:                           - Prior to the procedure, a History and Physical                            was performed, and patient medications and                            allergies were reviewed. The patient's tolerance of                            previous anesthesia was also reviewed. The risks                            and benefits of the procedure and the sedation                            options and risks were discussed with the patient.                            All questions were answered, and informed consent                            was obtained. Prior Anticoagulants: The patient has                            taken no previous anticoagulant or antiplatelet                            agents. ASA Grade Assessment: II - A patient with                            mild  systemic disease. After reviewing the risks                            and benefits, the patient was deemed in                            satisfactory condition to undergo the procedure.                           After obtaining informed consent, the scope was                            passed under direct vision. The GIF-H190 ([id number]                            Olympus  gastroscope was introduced through the anus                            and advanced to the the sigmoid colon. The flexible                            sigmoidoscopy was accomplished without difficulty.                            The patient tolerated the procedure well. The                            quality of the bowel preparation was adequate. Scope In: 12:58:52 PM Scope Out: 1:06:27 PM Total Procedure Duration: 0 hours 7 minutes 35 seconds  Findings:      Hemorrhoids were found on perianal exam and retroflexed views.      A single (solitary) five mm ulcer was found in the distal rectum. No       bleeding was present. For hemostasis, three hemostatic clips were       successfully placed (MR conditional). There was no bleeding at the end       of the procedure.      Normal mucosa was found in the rectum, in the recto-sigmoid colon and in       the distal sigmoid colon. Impression:               - Hemorrhoids found on perianal exam and                            retroflexed views in the rectum.                           - A single (solitary) ulcer in the distal rectum.                            This appeared consistent with post banding ulcer.                            Clips (MR conditional) were placed with closure of  the ulcer.                           - Normal mucosa in the rectum, in the recto-sigmoid                            colon and in the distal sigmoid colon.                           - No specimens collected. Recommendation:           - Discharge patient to home (with escort).                           - Continue present medications.                           - Return to GI clinic at appointment to be                            scheduled. Procedure Code(s):        --- Professional ---                           [phone], Sigmoidoscopy, flexible; with control of                            bleeding, any method Diagnosis Code(s):        ---  Professional ---                           K62.6, Ulcer of anus and rectum                           K64.9, Unspecified hemorrhoids                           K92.1, Melena (includes Hematochezia) CPT copyright 2019 American Medical Association. All rights reserved. The codes documented in this report are preliminary and upon coder review may  be revised to meet current compliance requirements. Gerrit Heck, MD 06/06/2020 1:17:03 PM Number of Addenda: 0

## 2020-06-06 NOTE — Anesthesia Procedure Notes (Signed)
Procedure Name: MAC Date/Time: 06/06/2020 12:49 PM Performed by: Mariea Clonts, CRNA Pre-anesthesia Checklist: Patient identified, Emergency Drugs available, Patient being monitored, Timeout performed and Suction available Patient Re-evaluated:Patient Re-evaluated prior to induction Oxygen Delivery Method: Nasal cannula

## 2020-06-06 NOTE — Telephone Encounter (Signed)
Patient called last night at 11:55 PM to report large vol rectal bleeding x several episodes yesterday.  Had seen scant bleeding on Tues and Wed this week.  Painless. Had hemorrhoidal banding #1 on 05/20/20 with Cirigliano  No presyncopal symptoms Wants to try and avoid ER if possible  Concern for post banding hemorrhage.  I told her this may stop spontaneously, but if not could require endoscopic intervention for clipping.    She was instructed to go to the ER overnight if freq large vol bleeding or weakness, dizziness symptoms. I will alert Dr. Bryan Lemma and the office who can check on her this morning

## 2020-06-06 NOTE — Interval H&P Note (Signed)
History and Physical Interval Note:  06/06/2020 12:20 PM  Robin West  has presented today for surgery, with the diagnosis of rectal bleeding.  The various methods of treatment have been discussed with the patient and family. After consideration of risks, benefits and other options for treatment, the patient has consented to  Procedure(s): FLEXIBLE SIGMOIDOSCOPY (N/A) as a surgical intervention.  The patient's history has been reviewed, patient examined, no change in status, stable for surgery.  I have reviewed the patient's chart and labs.  Questions were answered to the patient's satisfaction.     Dominic Pea Robin West

## 2020-06-06 NOTE — Telephone Encounter (Signed)
Spoke to patient this morning who states that Dr Bryan Lemma has contacted her to let her know that someone from the hospital will call her with instructions for an endoscopic procedure to control the bleeding.

## 2020-06-06 NOTE — Telephone Encounter (Signed)
I spoke with the patient this morning and coordinated with the [facility] Endo staff. Plan for her to come in for rapid Covid test followed by urgent/emergent flexible sigmoidoscopy for therapeutic intent. Will check stat CBC on arrival as well. Discussed with patient and she agrees.

## 2020-06-06 NOTE — Transfer of Care (Signed)
Immediate Anesthesia Transfer of Care Note  Patient: Robin West  Procedure(s) Performed: FLEXIBLE SIGMOIDOSCOPY (N/A ) HEMOSTASIS CLIP PLACEMENT  Patient Location: Endoscopy Unit  Anesthesia Type:MAC  Level of Consciousness: awake, alert  and oriented  Airway & Oxygen Therapy: Patient Spontanous Breathing  Post-op Assessment: Report given to RN, Post -op Vital signs reviewed and stable and Patient moving all extremities X 4  Post vital signs: Reviewed and stable  Last Vitals:  Vitals Value Taken Time  BP    Temp    Pulse    Resp    SpO2      Last Pain:  Vitals:   06/06/20 1204  TempSrc: Temporal  PainSc: 0-No pain         Complications: No complications documented.

## 2020-06-06 NOTE — Anesthesia Postprocedure Evaluation (Signed)
Anesthesia Post Note  Patient: Robin West  Procedure(s) Performed: FLEXIBLE SIGMOIDOSCOPY (N/A ) HEMOSTASIS CLIP PLACEMENT     Patient location during evaluation: PACU Anesthesia Type: MAC Level of consciousness: awake and alert Pain management: pain level controlled Vital Signs Assessment: post-procedure vital signs reviewed and stable Respiratory status: spontaneous breathing, nonlabored ventilation, respiratory function stable and patient connected to nasal cannula oxygen Cardiovascular status: stable and blood pressure returned to baseline Postop Assessment: no apparent nausea or vomiting Anesthetic complications: no   No complications documented.  Last Vitals:  Vitals:   06/06/20 1320 06/06/20 1334  BP: 111/80 113/70  Pulse: 77 74  Resp: 17 19  Temp:    SpO2: 100% 100%    Last Pain:  Vitals:   06/06/20 1334  TempSrc:   PainSc: 0-No pain                 Jadie Allington P Dametrius Sanjuan

## 2020-06-08 ENCOUNTER — Encounter (HOSPITAL_COMMUNITY): Payer: Self-pay | Admitting: Gastroenterology

## 2020-06-19 ENCOUNTER — Encounter: Payer: Self-pay | Admitting: Internal Medicine

## 2020-06-25 ENCOUNTER — Ambulatory Visit: Payer: 59 | Admitting: Gastroenterology

## 2020-07-02 ENCOUNTER — Telehealth: Payer: Self-pay | Admitting: Internal Medicine

## 2020-07-02 ENCOUNTER — Other Ambulatory Visit: Payer: Self-pay | Admitting: Internal Medicine

## 2020-07-02 NOTE — Telephone Encounter (Signed)
She is set for tomorrow at 9:30 am

## 2020-07-02 NOTE — Telephone Encounter (Signed)
Lorre Munroe, NP to [place], [place]     06/19/20 12:45 PM Yeah we can do that. Can you schedule a lab only appt?  Last read by Bayard Hugger at 1:08 PM on 06/19/2020.

## 2020-07-02 NOTE — Telephone Encounter (Signed)
Pt called in wanted to know about labs that NP Baity put in but I didn't see anything.

## 2020-07-03 ENCOUNTER — Other Ambulatory Visit: Payer: Self-pay | Admitting: Internal Medicine

## 2020-07-03 ENCOUNTER — Other Ambulatory Visit (INDEPENDENT_AMBULATORY_CARE_PROVIDER_SITE_OTHER): Payer: 59

## 2020-07-03 ENCOUNTER — Other Ambulatory Visit: Payer: Self-pay

## 2020-07-03 DIAGNOSIS — K5730 Diverticulosis of large intestine without perforation or abscess without bleeding: Secondary | ICD-10-CM

## 2020-07-03 DIAGNOSIS — D125 Benign neoplasm of sigmoid colon: Secondary | ICD-10-CM | POA: Diagnosis not present

## 2020-07-03 DIAGNOSIS — K921 Melena: Secondary | ICD-10-CM | POA: Diagnosis not present

## 2020-07-03 LAB — SEDIMENTATION RATE: Sed Rate: 16 mm/hr (ref 0–20)

## 2020-07-03 LAB — HIGH SENSITIVITY CRP: CRP, High Sensitivity: 6.18 mg/L — ABNORMAL HIGH (ref 0.000–5.000)

## 2020-07-08 ENCOUNTER — Ambulatory Visit: Payer: 59 | Admitting: Gastroenterology

## 2020-07-22 ENCOUNTER — Ambulatory Visit: Payer: 59 | Admitting: Dermatology

## 2020-07-24 ENCOUNTER — Ambulatory Visit: Payer: 59 | Admitting: Gastroenterology

## 2020-07-25 ENCOUNTER — Encounter: Payer: Self-pay | Admitting: Internal Medicine

## 2020-09-17 ENCOUNTER — Telehealth: Payer: Self-pay | Admitting: Gastroenterology

## 2020-09-17 NOTE — Telephone Encounter (Signed)
Pt is requesting a call back from a nurse to discuss her abdominal pain, pt would like to know if she needs to come in or if something could be prescribed for her

## 2020-09-17 NOTE — Telephone Encounter (Signed)
Spoke with patient, she states that on Monday she began having abdominal pain, she states that her "stomach hurts to walk and to touch it". She states that she recently changed her oral contraceptives and her cycle started Monday and was really bad, she states that she had 10 BMs and described as loose stools, also reports bloating on Monday. She states that she was fine on Tuesday, heating pad helps some but does not provide long term relief. Patient has not taken any Dicyclomine and only takes Omeprazole as needed. Advised patient that she will need to begin taking the Dicyclomine to see if that provides any relief and she has been scheduled for a follow up with Dr. Bryan Lemma on Thursday, 09/25/20 at 2 PM. Patient will call if no improvement prior to her appointment. She verbalized understanding of all information and had no concerns at the end of the call.

## 2020-09-18 ENCOUNTER — Ambulatory Visit
Admission: RE | Admit: 2020-09-18 | Discharge: 2020-09-18 | Disposition: A | Discharge status: Home / Self Care | Payer: 59 | Source: Ambulatory Visit | Attending: Internal Medicine | Admitting: Internal Medicine

## 2020-09-18 ENCOUNTER — Other Ambulatory Visit: Payer: Self-pay

## 2020-09-18 VITALS — BP 116/83 | HR 102 | Temp 99.3°F | Resp 18 | Ht 66.0 in | Wt 165.0 lb

## 2020-09-18 DIAGNOSIS — R1084 Generalized abdominal pain: Secondary | ICD-10-CM | POA: Diagnosis not present

## 2020-09-18 NOTE — Discharge Instructions (Addendum)
I believe that your symptoms are related to constipation.  Go home and do the MiraLAX.  Follow-up with your GI specialist tomorrow if needed

## 2020-09-18 NOTE — ED Provider Notes (Addendum)
[facility]    CSN: [id number] Arrival date & time: 09/18/20  1238      History   Chief Complaint Chief Complaint  Patient presents with   Appointment   Abdominal Pain    HPI Robin West is a 40 y.o. female.   Patient is a 40 year old female with past medical history of IBS.  Reporting concerns for generalized abdominal pain that started Monday.  Had 8-10 loose stools on Monday after she had what was a possible second menstrual cycle for the month.  Bleeding lasted about 24 hours and then resolved.  Recently changed the way she takes her birth control.  Denies any blood in stool, nausea, vomiting.  Has not had a BM since Monday.  Reporting went to the bathroom this morning and had some gas and mucus but no stool.  Does have a sensation of needing to go to the bathroom.  Describes the pain as sharp at times, even in rectal area. Some back pain.  No fevers, chills.  No dysuria, hematuria or urinary frequency.  No vaginal discharge or itching or irritation.  Recent colonoscopy, endoscopy with no concerning findings.    Abdominal Pain   Past Medical History:  Diagnosis Date   Allergy    Anemia    Anxiety    Chicken pox    Dysplastic nevus 02/09/2017   Mid back spinal at bra line. Mild atypia, lateral and deep margins involved.   Dysplastic nevus 02/09/2017   Right proximal post. lat. thigh. Mild atypia, close to margin.   Frequent headaches    History of dysplastic nevus 01/31/2020   left mid buttocks, moderate   IBS (irritable bowel syndrome)     Patient Active Problem List   Diagnosis Date Noted   Grade II internal hemorrhoids    Rectal ulcer    Hematochezia    Anxiety 03/06/2020   GERD (gastroesophageal reflux disease) 06/05/2019   Primary insomnia 11/06/2018   Frequent headaches 10/22/2014   IBS (irritable bowel syndrome) 10/22/2014   Hemorrhoids 10/22/2014    Past Surgical History:  Procedure Laterality Date   FLEXIBLE  SIGMOIDOSCOPY N/A 06/06/2020   Procedure: FLEXIBLE SIGMOIDOSCOPY;  Surgeon: Lavena Bullion, DO;  Location: [facility];  Service: Gastroenterology;  Laterality: N/A;   GANGLION CYST EXCISION Left 01/08/2016   Procedure: EXCISION DORSAL GANGLION LEFT WRIST;  Surgeon: Daryll Brod, MD;  Location: [facility];  Service: Orthopedics;  Laterality: Left;   HEMORROIDECTOMY  2004   HEMOSTASIS CLIP PLACEMENT  06/06/2020   Procedure: HEMOSTASIS CLIP PLACEMENT;  Surgeon: Lavena Bullion, DO;  Location: [facility];  Service: Gastroenterology;;   TONSILLECTOMY  2010   WISDOM TOOTH EXTRACTION      OB History   No obstetric history on file.      Home Medications    Prior to Admission medications   Medication Sig Start Date End Date Taking? Authorizing Provider  busPIRone (BUSPAR) 5 MG tablet TAKE 1 TABLET BY MOUTH 2 TIMES DAILY AS NEEDED. 05/28/20   Jearld Fenton, NP  dicyclomine (BENTYL) 20 MG tablet Take 1 tablet (20 mg total) by mouth every 8 (eight) hours as needed for spasms. 05/20/20   Cirigliano, Vito V, DO  Galcanezumab-gnlm (EMGALITY) 120 MG/ML SOSY Inject 120 mg into the skin every 28 (twenty-eight) days.    [provider]  JUNEL FE 1/20 1-20 MG-MCG tablet Take 1 tablet by mouth daily. 05/19/17   [provider]  Multiple Vitamin (MULTI-VITAMINS) TABS Take by  mouth.    [provider]  omeprazole (PRILOSEC) 40 MG capsule TAKE 1 CAPSULE BY MOUTH EVERY DAY Patient taking differently: Take 40 mg by mouth as needed.  08/27/19   Jearld Fenton, NP  OVER THE COUNTER MEDICATION Sambucus Elderberry 500mg , zinc 5mg  and Vit C 90mg  - one daily    [provider]  Probiotic Product (PROBIOTIC PO) Take by mouth. BYE BYE Bloat- one daily    [provider]  promethazine (PHENERGAN) 25 MG tablet Take 25 mg by mouth as needed.  11/20/15   [provider]  SUMAtriptan (IMITREX) 100 MG tablet Take 1 tablet by mouth as needed. For  migraine may repeat in 2hrs if not better Max 2/24hr 09/18/14   [provider]  SUMAtriptan 6 MG/0.5ML SOAJ as needed.  11/21/15   [provider]    Family History Family History  Problem Relation Age of Onset   Arthritis Father    Hyperlipidemia Father    Hypertension Father    Arthritis Maternal Grandmother    Stroke Maternal Grandfather    Cancer Paternal Grandmother        Ovarian    Cancer Paternal Aunt        ovarian cancer   Cancer Paternal Uncle    Esophageal cancer Maternal Uncle    Colon cancer Neg Hx    Stomach cancer Neg Hx    Rectal cancer Neg Hx     Social History Social History   Tobacco Use   Smoking status: Never Smoker   Smokeless tobacco: Never Used  Vaping Use   Vaping Use: Never used  Substance Use Topics   Alcohol use: Yes    Alcohol/week: 0.0 standard drinks    Comment: rare   Drug use: No     Allergies   Patient has no known allergies.   Review of Systems Review of Systems  Gastrointestinal: Positive for abdominal pain.     Physical Exam Triage Vital Signs ED Triage Vitals  Enc Vitals Group     BP 09/18/20 1247 116/83     Pulse Rate 09/18/20 1247 (!) 102     Resp 09/18/20 1247 18     Temp 09/18/20 1247 99.3 F (37.4 C)     Temp Source 09/18/20 1247 Oral     SpO2 09/18/20 1247 98 %     Weight 09/18/20 1249 165 lb (74.8 kg)     Height 09/18/20 1249 5\' 6"  (1.676 m)     Head Circumference --      Peak Flow --      Pain Score 09/18/20 1248 6     Pain Loc --      Pain Edu? --      Excl. in [place]? --    No data found.  Updated Vital Signs BP 116/83 (BP Location: Left Arm)    Pulse (!) 102    Temp 99.3 F (37.4 C) (Oral)    Resp 18    Ht 5\' 6"  (1.676 m)    Wt 165 lb (74.8 kg)    LMP 09/14/2020    SpO2 98%    BMI 26.63 kg/m   Visual Acuity Right Eye Distance:   Left Eye Distance:   Bilateral Distance:    Right Eye Near:   Left Eye Near:    Bilateral Near:     Physical Exam Vitals and  nursing note reviewed.  Constitutional:      General: She is not in acute distress.  Appearance: Normal appearance. She is not ill-appearing, toxic-appearing or diaphoretic.  HENT:     Head: Normocephalic.     Nose: Nose normal.     Mouth/Throat:     Pharynx: Oropharynx is clear.  Eyes:     Conjunctiva/sclera: Conjunctivae normal.  Pulmonary:     Effort: Pulmonary effort is normal.  Abdominal:     Palpations: Abdomen is soft.     Tenderness: There is generalized abdominal tenderness. There is no right CVA tenderness, left CVA tenderness, guarding or rebound. Negative signs include Murphy's sign.  Musculoskeletal:        General: Normal range of motion.     Cervical back: Normal range of motion.  Skin:    General: Skin is warm and dry.     Findings: No rash.  Neurological:     Mental Status: She is alert.  Psychiatric:        Mood and Affect: Mood normal.      UC Treatments / Results  Labs (all labs ordered are listed, but only abnormal results are displayed) Labs Reviewed - No data to display  EKG   Radiology No results found.  Procedures Procedures (including critical care time)  Medications Ordered in UC Medications - No data to display  Initial Impression / Assessment and Plan / UC Course  I have reviewed the triage vital signs and the nursing notes.  Pertinent labs & imaging results that were available during my care of the patient were reviewed by me and considered in my medical decision making (see chart for details).     Generalized abdominal pain No acute abdomen on exam.  No concerning signs or symptoms or red flags. Believe that her symptoms may related to constipation.  Recommended MiraLAX for symptoms.  She is currently taking Bentyl which reports is not helping. She can stop this.  Increase fluids.  Has appointment for GI follow-up tomorrow Final Clinical Impressions(s) / UC Diagnoses   Final diagnoses:  Generalized abdominal pain      Discharge Instructions     I believe that your symptoms are related to constipation.  Go home and do the MiraLAX.  Follow-up with your GI specialist tomorrow if needed    ED Prescriptions    None     PDMP not reviewed this encounter.   Orvan July, NP 09/18/20 1310    Loura Halt A, NP 09/18/20 1311

## 2020-09-18 NOTE — ED Triage Notes (Signed)
Pt reports having abd pain that began on Monday. sts she had 8-10 bowel movements on Monday. Today when having a bowel movement, it was only mucus.  Also reports having back pain. Has an appointment with GI tomorrow. Hx of IBS.

## 2020-09-19 ENCOUNTER — Ambulatory Visit (INDEPENDENT_AMBULATORY_CARE_PROVIDER_SITE_OTHER): Payer: 59 | Admitting: Physician Assistant

## 2020-09-19 ENCOUNTER — Telehealth: Payer: Self-pay

## 2020-09-19 ENCOUNTER — Ambulatory Visit (INDEPENDENT_AMBULATORY_CARE_PROVIDER_SITE_OTHER)
Admission: RE | Admit: 2020-09-19 | Discharge: 2020-09-19 | Disposition: A | Discharge status: Home / Self Care | Payer: 59 | Source: Ambulatory Visit | Attending: Physician Assistant | Admitting: Physician Assistant

## 2020-09-19 ENCOUNTER — Encounter: Payer: Self-pay | Admitting: Physician Assistant

## 2020-09-19 ENCOUNTER — Encounter: Payer: Self-pay | Admitting: Internal Medicine

## 2020-09-19 ENCOUNTER — Other Ambulatory Visit (INDEPENDENT_AMBULATORY_CARE_PROVIDER_SITE_OTHER): Payer: 59

## 2020-09-19 VITALS — BP 102/66 | HR 84 | Ht 66.0 in | Wt 163.0 lb

## 2020-09-19 DIAGNOSIS — R5383 Other fatigue: Secondary | ICD-10-CM

## 2020-09-19 DIAGNOSIS — K5900 Constipation, unspecified: Secondary | ICD-10-CM

## 2020-09-19 DIAGNOSIS — R1032 Left lower quadrant pain: Secondary | ICD-10-CM

## 2020-09-19 LAB — COMPREHENSIVE METABOLIC PANEL
ALT: 13 U/L (ref 0–35)
AST: 9 U/L (ref 0–37)
Albumin: 4 g/dL (ref 3.5–5.2)
Alkaline Phosphatase: 45 U/L (ref 39–117)
BUN: 19 mg/dL (ref 6–23)
CO2: 25 mEq/L (ref 19–32)
Calcium: 9.5 mg/dL (ref 8.4–10.5)
Chloride: 103 mEq/L (ref 96–112)
Creatinine, Ser: 0.62 mg/dL (ref 0.40–1.20)
GFR: 110.3 mL/min (ref >60.00)
Glucose, Bld: 118 mg/dL — ABNORMAL HIGH (ref 70–99)
Potassium: 4.3 mEq/L (ref 3.5–5.1)
Sodium: 137 mEq/L (ref 135–145)
Total Bilirubin: 0.2 mg/dL (ref 0.2–1.2)
Total Protein: 7.4 g/dL (ref 6.0–8.3)

## 2020-09-19 LAB — CBC WITH DIFFERENTIAL/PLATELET
Basophils Absolute: 0 10*3/uL (ref 0.0–0.1)
Basophils Relative: 0.4 % (ref 0.0–3.0)
Eosinophils Absolute: 0.2 10*3/uL (ref 0.0–0.7)
Eosinophils Relative: 1.4 % (ref 0.0–5.0)
HCT: 34.3 % — ABNORMAL LOW (ref 36.0–46.0)
Hemoglobin: 11.7 g/dL — ABNORMAL LOW (ref 12.0–15.0)
Lymphocytes Relative: 12.7 % (ref 12.0–46.0)
Lymphs Abs: 1.4 10*3/uL (ref 0.7–4.0)
MCHC: 33.9 g/dL (ref 30.0–36.0)
MCV: 90 fl (ref 78.0–100.0)
Monocytes Absolute: 0.6 10*3/uL (ref 0.1–1.0)
Monocytes Relative: 5 % (ref 3.0–12.0)
Neutro Abs: 8.9 10*3/uL — ABNORMAL HIGH (ref 1.4–7.7)
Neutrophils Relative %: 80.5 % — ABNORMAL HIGH (ref 43.0–77.0)
Platelets: 350 10*3/uL (ref 150.0–400.0)
RBC: 3.82 Mil/uL — ABNORMAL LOW (ref 3.87–5.11)
RDW: 12.4 % (ref 11.5–15.5)
WBC: 11.1 10*3/uL — ABNORMAL HIGH (ref 4.0–10.5)

## 2020-09-19 LAB — IBC + FERRITIN
Ferritin: 145 ng/mL (ref 10.0–291.0)
Iron: 29 ug/dL — ABNORMAL LOW (ref 42–145)
Saturation Ratios: 8 % — ABNORMAL LOW (ref 20.0–50.0)
Transferrin: 260 mg/dL (ref 212.0–360.0)

## 2020-09-19 LAB — FOLATE: Folate: >23.6 ng/mL (ref >5.9)

## 2020-09-19 LAB — VITAMIN B12: Vitamin B-12: 434 pg/mL (ref 211–911)

## 2020-09-19 MED ORDER — IOHEXOL 300 MG/ML  SOLN
100.0000 mL | Freq: Once | INTRAMUSCULAR | Status: AC | PRN
  Administered 2020-09-19: 100 mL via INTRAVENOUS

## 2020-09-19 MED ORDER — METRONIDAZOLE 500 MG PO TABS: 500.0000 mg | ORAL_TABLET | Freq: Three times a day (TID) | ORAL | 0 refills | Status: AC

## 2020-09-19 MED ORDER — CIPROFLOXACIN HCL 500 MG PO TABS: 500.0000 mg | ORAL_TABLET | Freq: Two times a day (BID) | ORAL | 0 refills | Status: AC

## 2020-09-19 NOTE — Telephone Encounter (Signed)
Prescriptions sent to patient's pharmacy.

## 2020-09-19 NOTE — Progress Notes (Signed)
Chief Complaint: Abdominal pain and constipation  HPI:    Robin West is a 40 year old Caucasian female with a past medical history as listed below, known to Dr. Bryan Lemma for her IBS, she presents to clinic today with a lower abdominal pain and constipation.    10/21 EGD and colonoscopy.  EGD with Hill grade 2, duodenal biopsies negative, gastric biopsies with non-H. pylori gastritis.  Colonoscopy with hyperplastic polyps x2, sigmoid diverticulosis and otherwise normal.  Repeat recommended 10 years.    05/20/2020 patient seen in clinic for constipation and hemorrhoids by Dr. Bryan Lemma.  At that time it was discussed she had mixed type IBS with a several history of alternating bowel habits and abdominal bloating, celiac serology negative, normal TSH and CMP in August, normal ESR and CRP as well as fecal calprotectin.  Reflux is improved with Omeprazole.  She had hemorrhoidectomy in her early 30s and had a hemorrhoid banding on that day.    06/06/2020 flex sig due to rectal bleeding.  Ulcer found thought from hemorrhoid banding, this was clipped    09/18/2020 patient seen in urgent care in [place].  At that time described generalized abdominal pain since Monday, she was told that she was constipated and told to use MiraLAX.    Today, the patient describes that she just got switched to a different birth control pill and had already had her period 2 weeks ago but then on Sunday had a lot of menstrual bleeding with clots etc, this stopped Monday which is when her abdominal pain started, mostly on the left side of her lower abdomen but radiates across to the other side as well.  Tells me that on Monday she had about 10 liquid bowel movements but then has not passed a stool since.  She continues with this abdominal pain rated as an 8-9/10 which has maybe "slightly gotten better during the week".  Maybe slightly better with bowel movement this morning after taking 1 packet of MiraLAX last night.  She was  seen in the urgent care yesterday as above, but they did no testing or labs and said that she was constipated.  This morning patient tells me that she is somewhat nauseous.  This pain has been so bad that is been hard for her to walk around.  Associated symptoms include extreme fatigue, the patient tells me she had to go and lay down for 3 hours in the middle of the day yesterday to sleep.  This is very unlike her because she is a "workaholic".  Was using Dicyclomine which was not seeming to help and worried that this would make her more constipated so she stopped it.  It has even been hard for her to sleep due to the discomfort.    Denies fever, chills, weight loss or vomiting.  Past Medical History:  Diagnosis Date  . Allergy   . Anemia   . Anxiety   . Chicken pox   . Dysplastic nevus 02/09/2017   Mid back spinal at bra line. Mild atypia, lateral and deep margins involved.  Marland Kitchen Dysplastic nevus 02/09/2017   Right proximal post. lat. thigh. Mild atypia, close to margin.  . Frequent headaches   . History of dysplastic nevus 01/31/2020   left mid buttocks, moderate  . IBS (irritable bowel syndrome)     Past Surgical History:  Procedure Laterality Date  . FLEXIBLE SIGMOIDOSCOPY N/A 06/06/2020   Procedure: FLEXIBLE SIGMOIDOSCOPY;  Surgeon: Lavena Bullion, DO;  Location: [facility];  Service: Gastroenterology;  Laterality: N/A;  . GANGLION CYST EXCISION Left 01/08/2016   Procedure: EXCISION DORSAL GANGLION LEFT WRIST;  Surgeon: Daryll Brod, MD;  Location: [facility];  Service: Orthopedics;  Laterality: Left;  . HEMORROIDECTOMY  2004  . HEMOSTASIS CLIP PLACEMENT  06/06/2020   Procedure: HEMOSTASIS CLIP PLACEMENT;  Surgeon: Lavena Bullion, DO;  Location: [facility];  Service: Gastroenterology;;  . TONSILLECTOMY  2010  . WISDOM TOOTH EXTRACTION      Current Outpatient Medications  Medication Sig Dispense Refill  . busPIRone (BUSPAR) 5 MG tablet TAKE 1 TABLET BY MOUTH 2  TIMES DAILY AS NEEDED. 60 tablet 5  . dicyclomine (BENTYL) 20 MG tablet Take 1 tablet (20 mg total) by mouth every 8 (eight) hours as needed for spasms. 30 tablet 5  . Galcanezumab-gnlm (EMGALITY) 120 MG/ML SOSY Inject 120 mg into the skin every 28 (twenty-eight) days.    Lenda Kelp FE 1/20 1-20 MG-MCG tablet Take 1 tablet by mouth daily.  3  . Multiple Vitamin (MULTI-VITAMINS) TABS Take by mouth.    Marland Kitchen omeprazole (PRILOSEC) 40 MG capsule TAKE 1 CAPSULE BY MOUTH EVERY DAY (Patient taking differently: Take 40 mg by mouth as needed.) 90 capsule 1  . OVER THE COUNTER MEDICATION Elderberry Syrup- daily    . Polyethylene Glycol 3350 (MIRALAX PO) Take by mouth as needed.    . Probiotic Product (PROBIOTIC PO) Take by mouth. BYE BYE Bloat- one daily    . promethazine (PHENERGAN) 25 MG tablet Take 25 mg by mouth as needed.     . SUMAtriptan (IMITREX) 100 MG tablet Take 1 tablet by mouth as needed. For migraine may repeat in 2hrs if not better Max 2/24hr  2  . SUMAtriptan 6 MG/0.5ML SOAJ as needed.   3   No current facility-administered medications for this visit.    Allergies as of 09/19/2020  . (No Known Allergies)    Family History  Problem Relation Age of Onset  . Arthritis Father   . Hyperlipidemia Father   . Hypertension Father   . Arthritis Maternal Grandmother   . Stroke Maternal Grandfather   . Cancer Paternal Grandmother        Ovarian   . Cancer Paternal Aunt        ovarian cancer  . Esophageal cancer Paternal Uncle   . Colon cancer Neg Hx   . Stomach cancer Neg Hx   . Rectal cancer Neg Hx     Social History   Socioeconomic History  . Marital status: Married    Spouse name: Not on file  . Number of children: 2  . Years of education: Not on file  . Highest education level: Not on file  Occupational History  . Occupation: Press photographer Rep  Tobacco Use  . Smoking status: Never Smoker  . Smokeless tobacco: Never Used  Vaping Use  . Vaping Use: Never used  Substance and Sexual  Activity  . Alcohol use: Yes    Alcohol/week: 0.0 standard drinks    Comment: rare  . Drug use: No  . Sexual activity: Yes    Partners: Male    Birth control/protection: Pill  Other Topics Concern  . Not on file  Social History Narrative  . Not on file   Social Determinants of Health   Financial Resource Strain: Not on file  Food Insecurity: Not on file  Transportation Needs: Not on file  Physical Activity: Not on file  Stress: Not on file  Social Connections: Not on file  Intimate Partner Violence: Not on file    Review of Systems:    Constitutional: No weight loss, fever or chills Cardiovascular: No chest pain Respiratory: No SOB  Gastrointestinal: See HPI and otherwise negative   Physical Exam:  Vital signs: BP 102/66   Pulse 84   Ht _0  (1.676 m)   Wt 163 lb (73.9 kg)   LMP 09/14/2020   BMI 26.31 kg/m   Constitutional:   Pleasant Caucasian female appears to be in NAD, Well developed, Well nourished, alert and cooperative Respiratory: Respirations even and unlabored. Lungs clear to auscultation bilaterally.   No wheezes, crackles, or rhonchi.  Cardiovascular: Normal S1, S2. No MRG. Regular rate and rhythm. No peripheral edema, cyanosis or pallor.  Gastrointestinal:  Soft, nondistended, marked TTP in the left lower quadrant with involuntary guarding, decreased bowel sounds all 4 quadrants. No appreciable masses or hepatomegaly. Rectal:  Not performed.  Psychiatric: Demonstrates good judgement and reason without abnormal affect or behaviors.  No recent labs or imaging.  Assessment: 1.  Marked left lower quadrant pain: Diverticulosis seen on recent colonoscopy, concerned that this is diverticulitis 2.  Constipation: For the past 4 days, could be contributing to pain 3.  Fatigue: Extreme fatigue over the past week; concern for infectious process i.e. diverticulitis  Plan: 1.  Ordered CBC, CMP, iron studies, B12 and folate today 2.  Ordered stat CT of the  abdomen pelvis for further evaluation of marked left lower quadrant pain, suspicion of diverticulitis. 3.  Explained to the patient that our office will get these results today and someone should call her with further recommendations.  If she has diverticulitis she will need antibiotics, if she is constipated she will need a bowel purge, if there is no abnormality then would recommend that she follow-up with her gynecologist. 4.  Patient to follow in clinic per recommendations after labs and imaging above.  Ellouise Newer, PA-C [facility] 09/19/2020, 10:25 AM  Cc: Jearld Fenton, NP

## 2020-09-19 NOTE — Telephone Encounter (Signed)
Thank you so much for letting me know Imbary.    I called the patient just now at 4:11 PM and let her know.  Explained that hopefully after a few days of these antibiotics she will start feeling better.  If she does not feel better by the beginning of next week or her pain increases then she needs to call and let us know.  Also recommend she stay on a daily dose of MiraLAX to keep stools consistent and avoid constipation.  Amanda please send her in Ciprofloxacin 500 mg twice daily x10 days and Flagyl 500 mg 3 times daily x10 days.  She asked that this be sent to the [facility] in [place].  Thanks for working on this today.  Thank goodness we found an answer.  Thank you, J LL

## 2020-09-19 NOTE — Telephone Encounter (Signed)
Received call report from Louann at [facility] radiology. Final report will crossover to epic soon they are having technical difficulties.   Report shows non complicated sigmoid diverticulitis, possible constipation

## 2020-09-19 NOTE — Addendum Note (Signed)
Addended by: Dorisann Frames L on: 09/19/2020 04:26 PM   Modules accepted: Orders

## 2020-09-19 NOTE — Patient Instructions (Signed)
Your provider has requested that you go to the basement level for lab work before leaving today. Press "B" on the elevator. The lab is located at the first door on the left as you exit the elevator.  You have been scheduled for a CT scan of the abdomen and pelvis at [facility] ([address]---this is in the same building as [employer]).   You are scheduled on 09/19/20 at 1:45pm. You should arrive 15 minutes prior to your appointment time for registration. Please follow the written instructions below on the day of your exam:  WARNING: IF YOU ARE ALLERGIC TO IODINE/X-RAY DYE, PLEASE NOTIFY RADIOLOGY IMMEDIATELY AT [phone]! YOU WILL BE GIVEN A 13 HOUR PREMEDICATION PREP.  1) Do not eat anything starting now. 2) You have been given 2 bottles of oral contrast to drink. The solution may taste better if refrigerated, but do NOT add ice or any other liquid to this solution. Shake well before drinking.    Drink 1 bottle of contrast @ 11:30am (2 hours prior to your exam)  Drink 1 bottle of contrast @ 12:30pm (1 hour prior to your exam)  You may take any medications as prescribed with a small amount of water, if necessary. If you take any of the following medications: METFORMIN, GLUCOPHAGE, GLUCOVANCE, AVANDAMET, RIOMET, FORTAMET, [place] MET, JANUMET, GLUMETZA or METAGLIP, you MAY be asked to HOLD this medication 48 hours AFTER the exam.  The purpose of you drinking the oral contrast is to aid in the visualization of your intestinal tract. The contrast solution may cause some diarrhea. Depending on your individual set of symptoms, you may also receive an intravenous injection of x-ray contrast/dye. Plan on being at [facility] for 30 minutes or longer, depending on the type of exam you are having performed.  This test typically takes 30-45 minutes to complete.  If you have any questions regarding your exam or if you need to reschedule, you may call the CT department  at [phone] between the hours of 8:00 am and 5:00 pm, Monday-Friday.  ___________________________________________________________  Normal BMI (Body Mass Index- based on height and weight) is between 19 and 25. Your BMI today is Body mass index is 26.31 kg/m. Marland Kitchen Please consider follow up  regarding your BMI with your Primary Care Provider.

## 2020-09-20 ENCOUNTER — Other Ambulatory Visit: Payer: Self-pay | Admitting: Internal Medicine

## 2020-09-23 NOTE — Telephone Encounter (Signed)
Thanks for letting me know ER, she has a history of constipation and I am worried that that is truly what is leading her pain.  I would like to make sure she is getting cleaned out before we add any other medications.  Thanks-JLL

## 2020-09-23 NOTE — Telephone Encounter (Signed)
Spoke with patient in regards to Robin West's recommendation for Miralax purge. Patient has been instructed on how to complete the purge, she states that she will complete the purge this evening. Advised patient to give [name] an update after she completes the purge, she is aware that she can contact us via My Chart or give [name] a call. Patient verbalized understanding and had no concerns at the end of the call.

## 2020-09-25 ENCOUNTER — Ambulatory Visit: Payer: 59 | Admitting: Gastroenterology

## 2020-09-25 NOTE — Progress Notes (Signed)
Agree with the assessment and plan as outlined by Ellouise Newer, PA-C.  Lesli Issa, DO, [facility]

## 2020-10-07 ENCOUNTER — Other Ambulatory Visit: Payer: Self-pay

## 2020-10-07 ENCOUNTER — Encounter: Payer: Self-pay | Admitting: Dermatology

## 2020-10-07 ENCOUNTER — Ambulatory Visit (INDEPENDENT_AMBULATORY_CARE_PROVIDER_SITE_OTHER): Payer: Self-pay | Admitting: Dermatology

## 2020-10-07 DIAGNOSIS — L988 Other specified disorders of the skin and subcutaneous tissue: Secondary | ICD-10-CM

## 2020-10-07 NOTE — Patient Instructions (Signed)

## 2020-10-07 NOTE — Progress Notes (Signed)
   Follow-Up Visit   Subjective  Robin West is a 40 y.o. female who presents for the following: Facial Elastosis (Patient is interested in having Botox injected today. She has been going to a place in [place] and was last injected about 6 months ago).  The following portions of the chart were reviewed this encounter and updated as appropriate:   Tobacco  Allergies  Meds  Problems  Med Hx  Surg Hx  Fam Hx     Review of Systems:  No other skin or systemic complaints except as noted in HPI or Assessment and Plan.  Objective  Well appearing patient in no apparent distress; mood and affect are within normal limits.  A focused examination was performed including the face. Relevant physical exam findings are noted in the Assessment and Plan.  Objective  Face: Rhytides and volume loss.    Images                Assessment & Plan  Elastosis of skin Face  Botox 40 units injected as marked: - Frown complex 20 units - Crow's feet 5 units each side - Forehead 10 units  Botox Injection - Face Location: See attached image  Informed consent: Discussed risks (infection, pain, bleeding, bruising, swelling, allergic reaction, paralysis of nearby muscles, eyelid droop, double vision, neck weakness, difficulty breathing, headache, undesirable cosmetic result, and need for additional treatment) and benefits of the procedure, as well as the alternatives.  Informed consent was obtained.  Preparation: The area was cleansed with alcohol.  Procedure Details:  Botox was injected into the dermis with a 30-gauge needle. Pressure applied to any bleeding. Ice packs offered for swelling.  Lot Number:  [id number] Expiration:  11/2022  Total Units Injected:  40  Plan: Patient was instructed to remain upright for 4 hours. Patient was instructed to avoid massaging the face and avoid vigorous exercise for the rest of the day. Tylenol may be used for headache.  Allow 2 weeks before  returning to clinic for additional dosing as needed. Patient will call for any problems.   Return in about 4 weeks (around 11/04/2020) for Botox follow up .  Luther Redo, CMA, am acting as scribe for Sarina Ser, MD .  Documentation: I have reviewed the above documentation for accuracy and completeness, and I agree with the above.  Sarina Ser, MD

## 2020-10-28 ENCOUNTER — Telehealth: Payer: Self-pay

## 2020-10-28 NOTE — Telephone Encounter (Signed)
Copied from [place] [id number]. Topic: General - Call Back - No Documentation >> Oct 28, 2020 11:45 AM Erick Blinks wrote: Reason for CRM: Pt called requesting to be scheduled for lab work (CBC and Iron) please advise when orders are ready.   Best contact: [phone]

## 2020-10-29 ENCOUNTER — Other Ambulatory Visit: Payer: Self-pay | Admitting: Internal Medicine

## 2020-11-04 ENCOUNTER — Ambulatory Visit: Payer: 59 | Admitting: Dermatology

## 2020-11-04 ENCOUNTER — Other Ambulatory Visit: Payer: Self-pay | Admitting: Internal Medicine

## 2020-11-04 ENCOUNTER — Other Ambulatory Visit: Payer: Self-pay

## 2020-11-04 ENCOUNTER — Other Ambulatory Visit: Payer: 59

## 2020-11-04 DIAGNOSIS — D508 Other iron deficiency anemias: Secondary | ICD-10-CM

## 2020-11-05 ENCOUNTER — Encounter: Payer: Self-pay | Admitting: Internal Medicine

## 2020-11-05 DIAGNOSIS — D508 Other iron deficiency anemias: Secondary | ICD-10-CM

## 2020-11-05 LAB — CBC WITH DIFFERENTIAL/PLATELET
Absolute Monocytes: 555 cells/uL (ref 200–950)
Basophils Absolute: 18 cells/uL (ref 0–200)
Basophils Relative: 0.2 %
Eosinophils Absolute: 100 cells/uL (ref 15–500)
Eosinophils Relative: 1.1 %
HCT: 37.6 % (ref 35.0–45.0)
Hemoglobin: 12.3 g/dL (ref 11.7–15.5)
Lymphs Abs: 1010 cells/uL (ref 850–3900)
MCH: 30.5 pg (ref 27.0–33.0)
MCHC: 32.7 g/dL (ref 32.0–36.0)
MCV: 93.3 fL (ref 80.0–100.0)
MPV: 10.5 fL (ref 7.5–12.5)
Monocytes Relative: 6.1 %
Neutro Abs: 7417 cells/uL (ref 1500–7800)
Neutrophils Relative %: 81.5 %
Platelets: 338 10*3/uL (ref 140–400)
RBC: 4.03 10*6/uL (ref 3.80–5.10)
RDW: 12.5 % (ref 11.0–15.0)
Total Lymphocyte: 11.1 %
WBC: 9.1 10*3/uL (ref 3.8–10.8)

## 2020-11-05 LAB — IRON,TIBC AND FERRITIN PANEL
%SAT: 9 % (calc) — ABNORMAL LOW (ref 16–45)
Ferritin: 58 ng/mL (ref 16–232)
Iron: 31 ug/dL — ABNORMAL LOW (ref 40–190)
TIBC: 347 mcg/dL (calc) (ref 250–450)

## 2021-01-16 ENCOUNTER — Encounter: Payer: Self-pay | Admitting: Internal Medicine

## 2021-01-19 ENCOUNTER — Other Ambulatory Visit: Payer: Self-pay

## 2021-01-19 DIAGNOSIS — K219 Gastro-esophageal reflux disease without esophagitis: Secondary | ICD-10-CM

## 2021-01-19 DIAGNOSIS — K921 Melena: Secondary | ICD-10-CM

## 2021-01-19 DIAGNOSIS — R195 Other fecal abnormalities: Secondary | ICD-10-CM

## 2021-01-19 DIAGNOSIS — D508 Other iron deficiency anemias: Secondary | ICD-10-CM

## 2021-01-19 DIAGNOSIS — R197 Diarrhea, unspecified: Secondary | ICD-10-CM

## 2021-01-20 ENCOUNTER — Other Ambulatory Visit: Payer: Self-pay

## 2021-01-20 ENCOUNTER — Other Ambulatory Visit: Payer: 59

## 2021-01-20 ENCOUNTER — Ambulatory Visit (INDEPENDENT_AMBULATORY_CARE_PROVIDER_SITE_OTHER): Payer: Self-pay | Admitting: Dermatology

## 2021-01-20 DIAGNOSIS — L988 Other specified disorders of the skin and subcutaneous tissue: Secondary | ICD-10-CM

## 2021-01-20 LAB — IRON,TIBC AND FERRITIN PANEL
%SAT: 24 % (calc) (ref 16–45)
Ferritin: 66 ng/mL (ref 16–232)
Iron: 89 ug/dL (ref 40–190)
TIBC: 368 mcg/dL (calc) (ref 250–450)

## 2021-01-20 LAB — CBC WITH DIFFERENTIAL/PLATELET
Absolute Monocytes: 423 cells/uL (ref 200–950)
Basophils Absolute: 39 cells/uL (ref 0–200)
Basophils Relative: 0.6 %
Eosinophils Absolute: 111 cells/uL (ref 15–500)
Eosinophils Relative: 1.7 %
HCT: 39.1 % (ref 35.0–45.0)
Hemoglobin: 12.8 g/dL (ref 11.7–15.5)
Lymphs Abs: 1333 cells/uL (ref 850–3900)
MCH: 31 pg (ref 27.0–33.0)
MCHC: 32.7 g/dL (ref 32.0–36.0)
MCV: 94.7 fL (ref 80.0–100.0)
MPV: 10.6 fL (ref 7.5–12.5)
Monocytes Relative: 6.5 %
Neutro Abs: 4596 cells/uL (ref 1500–7800)
Neutrophils Relative %: 70.7 %
Platelets: 320 10*3/uL (ref 140–400)
RBC: 4.13 10*6/uL (ref 3.80–5.10)
RDW: 11.7 % (ref 11.0–15.0)
Total Lymphocyte: 20.5 %
WBC: 6.5 10*3/uL (ref 3.8–10.8)

## 2021-01-20 NOTE — Progress Notes (Signed)
   Follow-Up Visit   Subjective  Robin West is a 40 y.o. female who presents for the following: Facial Elastosis (Patient is here today for Botox injections. She still noticed forehead lines and would like to discuss further treatment. ).  The following portions of the chart were reviewed this encounter and updated as appropriate:   Tobacco  Allergies  Meds  Problems  Med Hx  Surg Hx  Fam Hx     Review of Systems:  No other skin or systemic complaints except as noted in HPI or Assessment and Plan.  Objective  Well appearing patient in no apparent distress; mood and affect are within normal limits.  A focused examination was performed including the face. Relevant physical exam findings are noted in the Assessment and Plan.  Face Rhytides and volume loss.      Assessment & Plan  Elastosis of skin Face  Recommend Skin Tyte and/or radiotherapy for the underside chin area.   Increase Botox by 5 units on the upper forehead. There were 1-2 cm between the forehead and upper forehead injection sites.  Botox 45 units injected as marked:  - Frown complex 20 units - Crow's feet 5 units each  - Forehead 15 units    Botox Injection - Face Location: See attached image  Informed consent: Discussed risks (infection, pain, bleeding, bruising, swelling, allergic reaction, paralysis of nearby muscles, eyelid droop, double vision, neck weakness, difficulty breathing, headache, undesirable cosmetic result, and need for additional treatment) and benefits of the procedure, as well as the alternatives.  Informed consent was obtained.  Preparation: The area was cleansed with alcohol.  Procedure Details:  Botox was injected into the dermis with a 30-gauge needle. Pressure applied to any bleeding. Ice packs offered for swelling.  Lot Number:  [id number] Expiration:  09/2022  Total Units Injected:  45  Plan: Patient was instructed to remain upright for 4 hours. Patient was instructed  to avoid massaging the face and avoid vigorous exercise for the rest of the day. Tylenol may be used for headache.  Allow 2 weeks before returning to clinic for additional dosing as needed. Patient will call for any problems.   Return for Botox follow up in 3 weeks on a Tuesday.  Luther Redo, CMA, am acting as scribe for Sarina Ser, MD . Documentation: I have reviewed the above documentation for accuracy and completeness, and I agree with the above.  Sarina Ser, MD

## 2021-01-20 NOTE — Patient Instructions (Signed)

## 2021-01-21 ENCOUNTER — Encounter: Payer: Self-pay | Admitting: Internal Medicine

## 2021-01-21 ENCOUNTER — Encounter: Payer: Self-pay | Admitting: Dermatology

## 2021-02-05 ENCOUNTER — Encounter: Payer: Self-pay | Admitting: Dermatology

## 2021-02-05 ENCOUNTER — Other Ambulatory Visit: Payer: Self-pay

## 2021-02-05 ENCOUNTER — Ambulatory Visit: Payer: 59 | Admitting: Dermatology

## 2021-02-05 DIAGNOSIS — L821 Other seborrheic keratosis: Secondary | ICD-10-CM

## 2021-02-05 DIAGNOSIS — L988 Other specified disorders of the skin and subcutaneous tissue: Secondary | ICD-10-CM

## 2021-02-05 DIAGNOSIS — Z86018 Personal history of other benign neoplasm: Secondary | ICD-10-CM | POA: Diagnosis not present

## 2021-02-05 DIAGNOSIS — D229 Melanocytic nevi, unspecified: Secondary | ICD-10-CM

## 2021-02-05 DIAGNOSIS — Z1283 Encounter for screening for malignant neoplasm of skin: Secondary | ICD-10-CM

## 2021-02-05 DIAGNOSIS — L578 Other skin changes due to chronic exposure to nonionizing radiation: Secondary | ICD-10-CM

## 2021-02-05 DIAGNOSIS — L814 Other melanin hyperpigmentation: Secondary | ICD-10-CM

## 2021-02-05 DIAGNOSIS — D1800 Hemangioma unspecified site: Secondary | ICD-10-CM

## 2021-02-05 NOTE — Progress Notes (Signed)
   Follow-Up Visit   Subjective  Robin West is a 40 y.o. female who presents for the following: Total body skin exam (Hx of dysplastic nevi) and Facial Elastosis (Face, Botox f/u, pt had Botox 01/20/21). The patient presents for Total-Body Skin Exam (TBSE) for skin cancer screening and mole check.  The following portions of the chart were reviewed this encounter and updated as appropriate:   Tobacco  Allergies  Meds  Problems  Med Hx  Surg Hx  Fam Hx     Review of Systems:  No other skin or systemic complaints except as noted in HPI or Assessment and Plan.  Objective  Well appearing patient in no apparent distress; mood and affect are within normal limits.  A full examination was performed including scalp, head, eyes, ears, nose, lips, neck, chest, axillae, abdomen, back, buttocks, bilateral upper extremities, bilateral lower extremities, hands, feet, fingers, toes, fingernails, and toenails. All findings within normal limits unless otherwise noted below.  multiple Scars with no evidence of recurrence.   face Minimal Rhytides and volume loss.        R sup helix 0.5 x 0.3cm light brown macule      Assessment & Plan   Lentigines - Scattered tan macules - Due to sun exposure - Benign-appering, observe - Recommend daily broad spectrum sunscreen SPF 30+ to sun-exposed areas, reapply every 2 hours as needed. - Call for any changes  Seborrheic Keratoses - Stuck-on, waxy, tan-brown papules and/or plaques  - Benign-appearing - Discussed benign etiology and prognosis. - Observe - Call for any changes  Melanocytic Nevi - Tan-brown and/or pink-flesh-colored symmetric macules and papules - Benign appearing on exam today - Observation - Call clinic for new or changing moles - Recommend daily use of broad spectrum spf 30+ sunscreen to sun-exposed areas.   Hemangiomas - Red papules - Discussed benign nature - Observe - Call for any changes  Actinic Damage -  Chronic condition, secondary to cumulative UV/sun exposure - diffuse scaly erythematous macules with underlying dyspigmentation - Recommend daily broad spectrum sunscreen SPF 30+ to sun-exposed areas, reapply every 2 hours as needed.  - Staying in the shade or wearing long sleeves, sun glasses (UVA+UVB protection) and wide brim hats (4-inch brim around the entire circumference of the hat) are also recommended for sun protection.  - Call for new or changing lesions.  Skin cancer screening performed today.  History of dysplastic nevus multiple  Clear. Observe for recurrence. Call clinic for new or changing lesions.  Recommend regular skin exams, daily broad-spectrum spf 30+ sunscreen use, and photoprotection.    Elastosis of skin face  Good results with previous Botox injections, pt pleased  Discussed adding the 5 units plus more in the upper forehead at next visit. (There were 1-2cm between the forehead and upper forehead injection sites)  Lentigines R sup helix  Return in about 1 year (around 02/05/2022) for TBSE, Hx of Dysplastic nevi.  I, Othelia Pulling, RMA, am acting as scribe for Sarina Ser, MD . Documentation: I have reviewed the above documentation for accuracy and completeness, and I agree with the above.  Sarina Ser, MD

## 2021-02-05 NOTE — Patient Instructions (Signed)

## 2021-02-10 ENCOUNTER — Ambulatory Visit: Payer: 59 | Admitting: Dermatology

## 2021-02-10 ENCOUNTER — Other Ambulatory Visit: Payer: Self-pay | Admitting: Internal Medicine

## 2021-02-10 DIAGNOSIS — Z1231 Encounter for screening mammogram for malignant neoplasm of breast: Secondary | ICD-10-CM

## 2021-03-10 ENCOUNTER — Encounter: Payer: Self-pay | Admitting: Internal Medicine

## 2021-03-12 ENCOUNTER — Encounter: Payer: BC Managed Care – PPO | Admitting: Internal Medicine

## 2021-03-23 ENCOUNTER — Other Ambulatory Visit: Payer: Self-pay

## 2021-03-23 ENCOUNTER — Ambulatory Visit (INDEPENDENT_AMBULATORY_CARE_PROVIDER_SITE_OTHER): Payer: 59 | Admitting: Internal Medicine

## 2021-03-23 ENCOUNTER — Encounter: Payer: Self-pay | Admitting: Internal Medicine

## 2021-03-23 VITALS — BP 100/70 | HR 95 | Temp 97.8°F | Resp 17 | Ht 66.0 in | Wt 140.2 lb

## 2021-03-23 DIAGNOSIS — R519 Headache, unspecified: Secondary | ICD-10-CM

## 2021-03-23 DIAGNOSIS — Z0001 Encounter for general adult medical examination with abnormal findings: Secondary | ICD-10-CM

## 2021-03-23 DIAGNOSIS — D509 Iron deficiency anemia, unspecified: Secondary | ICD-10-CM

## 2021-03-23 DIAGNOSIS — R7303 Prediabetes: Secondary | ICD-10-CM | POA: Diagnosis not present

## 2021-03-23 DIAGNOSIS — E7800 Pure hypercholesterolemia, unspecified: Secondary | ICD-10-CM | POA: Diagnosis not present

## 2021-03-23 DIAGNOSIS — K582 Mixed irritable bowel syndrome: Secondary | ICD-10-CM

## 2021-03-23 DIAGNOSIS — Z1159 Encounter for screening for other viral diseases: Secondary | ICD-10-CM

## 2021-03-23 DIAGNOSIS — K219 Gastro-esophageal reflux disease without esophagitis: Secondary | ICD-10-CM

## 2021-03-23 DIAGNOSIS — F419 Anxiety disorder, unspecified: Secondary | ICD-10-CM

## 2021-03-23 DIAGNOSIS — F5101 Primary insomnia: Secondary | ICD-10-CM

## 2021-03-23 NOTE — Patient Instructions (Signed)
Health Maintenance, Female Adopting a healthy lifestyle and getting preventive care are important in promoting health and wellness. Ask your health care provider about: The right schedule for you to have regular tests and exams. Things you can do on your own to prevent diseases and keep yourself healthy. What should I know about diet, weight, and exercise? Eat a healthy diet  Eat a diet that includes plenty of vegetables, fruits, low-fat dairy products, and lean protein. Do not eat a lot of foods that are high in solid fats, added sugars, or sodium. Maintain a healthy weight Body mass index (BMI) is used to identify weight problems. It estimates body fat based on height and weight. Your health care provider can help determine your BMI and help you achieve or maintain a healthy weight. Get regular exercise Get regular exercise. This is one of the most important things you can do for your health. Most adults should: Exercise for at least 150 minutes each week. The exercise should increase your heart rate and make you sweat (moderate-intensity exercise). Do strengthening exercises at least twice a week. This is in addition to the moderate-intensity exercise. Spend less time sitting. Even light physical activity can be beneficial. Watch cholesterol and blood lipids Have your blood tested for lipids and cholesterol at 42 years of age, then have this test every 5 years. Have your cholesterol levels checked more often if: Your lipid or cholesterol levels are high. You are older than 42 years of age. You are at high risk for heart disease. What should I know about cancer screening? Depending on your health history and family history, you may need to have cancer screening at various ages. This may include screening for: Breast cancer. Cervical cancer. Colorectal cancer. Skin cancer. Lung cancer. What should I know about heart disease, diabetes, and high blood pressure? Blood pressure and heart  disease High blood pressure causes heart disease and increases the risk of stroke. This is more likely to develop in people who have high blood pressure readings, are of African descent, or are overweight. Have your blood pressure checked: Every 3-5 years if you are 18-39 years of age. Every year if you are 40 years old or older. Diabetes Have regular diabetes screenings. This checks your fasting blood sugar level. Have the screening done: Once every three years after age 40 if you are at a normal weight and have a low risk for diabetes. More often and at a younger age if you are overweight or have a high risk for diabetes. What should I know about preventing infection? Hepatitis B If you have a higher risk for hepatitis B, you should be screened for this virus. Talk with your health care provider to find out if you are at risk for hepatitis B infection. Hepatitis C Testing is recommended for: Everyone born from 1945 through 1965. Anyone with known risk factors for hepatitis C. Sexually transmitted infections (STIs) Get screened for STIs, including gonorrhea and chlamydia, if: You are sexually active and are younger than 42 years of age. You are older than 42 years of age and your health care provider tells you that you are at risk for this type of infection. Your sexual activity has changed since you were last screened, and you are at increased risk for chlamydia or gonorrhea. Ask your health care provider if you are at risk. Ask your health care provider about whether you are at high risk for HIV. Your health care provider may recommend a prescription medicine   to help prevent HIV infection. If you choose to take medicine to prevent HIV, you should first get tested for HIV. You should then be tested every 3 months for as long as you are taking the medicine. Pregnancy If you are about to stop having your period (premenopausal) and you may become pregnant, seek counseling before you get  pregnant. Take 400 to 800 micrograms (mcg) of folic acid every day if you become pregnant. Ask for birth control (contraception) if you want to prevent pregnancy. Osteoporosis and menopause Osteoporosis is a disease in which the bones lose minerals and strength with aging. This can result in bone fractures. If you are 65 years old or older, or if you are at risk for osteoporosis and fractures, ask your health care provider if you should: Be screened for bone loss. Take a calcium or vitamin D supplement to lower your risk of fractures. Be given hormone replacement therapy (HRT) to treat symptoms of menopause. Follow these instructions at home: Lifestyle Do not use any products that contain nicotine or tobacco, such as cigarettes, e-cigarettes, and chewing tobacco. If you need help quitting, ask your health care provider. Do not use street drugs. Do not share needles. Ask your health care provider for help if you need support or information about quitting drugs. Alcohol use Do not drink alcohol if: Your health care provider tells you not to drink. You are pregnant, may be pregnant, or are planning to become pregnant. If you drink alcohol: Limit how much you use to 0-1 drink a day. Limit intake if you are breastfeeding. Be aware of how much alcohol is in your drink. In the [place] one drink equals one 12 oz bottle of beer (355 mL), one 5 oz glass of wine (148 mL), or one 1 oz glass of hard liquor (44 mL). General instructions Schedule regular health, dental, and eye exams. Stay current with your vaccines. Tell your health care provider if: You often feel depressed. You have ever been abused or do not feel safe at home. Summary Adopting a healthy lifestyle and getting preventive care are important in promoting health and wellness. Follow your health care provider's instructions about healthy diet, exercising, and getting tested or screened for diseases. Follow your health care provider's  instructions on monitoring your cholesterol and blood pressure. This information is not intended to replace advice given to you by your health care provider. Make sure you discuss any questions you have with your health care provider. Document Revised: 08/29/2020 Document Reviewed: 06/14/2018 Elsevier Patient Education  2022 [employer]vier Inc.  

## 2021-03-23 NOTE — Assessment & Plan Note (Signed)
CBC and iron panel today Continue oral iron 

## 2021-03-23 NOTE — Assessment & Plan Note (Signed)
A1C today Encouraged her to consume a low carb diet

## 2021-03-23 NOTE — Assessment & Plan Note (Signed)
Discussed increasing her Buspar to 2 x day She will continue to see her therapist Support offered

## 2021-03-23 NOTE — Assessment & Plan Note (Signed)
CMET and lipid profile today Encouraged her to consume a low fat diet 

## 2021-03-23 NOTE — Assessment & Plan Note (Signed)
Currently not an issue She has Omeprazole to take if needed She will continue to see GI, will follow

## 2021-03-23 NOTE — Assessment & Plan Note (Signed)
Diet controlled She has Dicyclomine but is not needing to take it at this time She will continue to see GI, will follow

## 2021-03-23 NOTE — Assessment & Plan Note (Signed)
Currently not an issue Will monitor 

## 2021-03-23 NOTE — Progress Notes (Signed)
Subjective:    Patient ID: Robin West, female    DOB: 08-14-1980, 40 y.o.   MRN: [id number]  HPI  Patient presents the clinic today for her annual exam.  She is also due to follow-up chronic conditions.  IBS: Not currently taking alternating constipation and diarrhea.  Managed with diet and Dicyclomine only as needed with good relief of symptoms.  Colonoscopy from 04/2020 reviewed.  She follows with GI.  Frequent Headaches: These occur around her menses.  She is taking  OCP, Emgality, Imitrex and Phenergan as prescribed.  She follows with neurology.  Insomnia: She has not recently had an issues with sleep.  She no longer takes Trazodone..  There is no sleep study on file.  Anxiety: Chronic, managed on BuSpar. She does feel anxious at times during the day. She is seeing a therapist.  She denies depression, SI/HI.  GERD: She is not sure what triggers this.  She no longer takes Omeprazole.  Upper GI from 04/2020 reviewed.  HLD: Her last LDL was 119, triglycerides 67, 03/2020.  She is not taking any cholesterol-lowering medication at this time.  She has been trying to consume a low-fat diet.  Prediabetes: Her last A1c was 5.9%, 03/2020.  She is not taking any oral diabetic medication at this time.  She does not check her sugars.  Iron Deficiency Anemia: Her last H/H was 12.8/39.1, 01/2021. She is taking oral iron as prescribed. She does not follow with hematology.  Flu: never Tetanus: 11/2012 COVID: Never Pap smear: 03/2020, scheduled tomorrow Mammogram: 03/2020 Vision screening: Annually Dentist: Biannually  Diet: She does eat meat. She consumes fruits and veggies. She tries to avoid fried foods. She drinks mostly coffee and water. Exercise: Walking  Review of Systems     Past Medical History:  Diagnosis Date   Allergy    Anemia    Anxiety    Chicken pox    Dysplastic nevus 02/09/2017   Mid back spinal at bra line. Mild atypia, lateral and deep margins involved.    Dysplastic nevus 02/09/2017   Right proximal post. lat. thigh. Mild atypia, close to margin.   Frequent headaches    History of dysplastic nevus 01/31/2020   left mid buttocks, moderate   IBS (irritable bowel syndrome)     Current Outpatient Medications  Medication Sig Dispense Refill   busPIRone (BUSPAR) 5 MG tablet TAKE 1 TABLET BY MOUTH 2 TIMES DAILY AS NEEDED. 180 tablet 0   dicyclomine (BENTYL) 20 MG tablet Take 1 tablet (20 mg total) by mouth every 8 (eight) hours as needed for spasms. 30 tablet 5   Galcanezumab-gnlm (EMGALITY) 120 MG/ML SOSY Inject 120 mg into the skin every 28 (twenty-eight) days.     JUNEL FE 1/20 1-20 MG-MCG tablet Take 1 tablet by mouth daily.  3   Multiple Vitamin (MULTI-VITAMINS) TABS Take by mouth.     omeprazole (PRILOSEC) 40 MG capsule TAKE 1 CAPSULE BY MOUTH EVERY DAY 90 capsule 1   OVER THE COUNTER MEDICATION Elderberry Syrup- daily     Polyethylene Glycol 3350 (MIRALAX PO) Take by mouth as needed.     Probiotic Product (PROBIOTIC PO) Take by mouth. BYE BYE Bloat- one daily     promethazine (PHENERGAN) 25 MG tablet Take 25 mg by mouth as needed.      SUMAtriptan (IMITREX) 100 MG tablet Take 1 tablet by mouth as needed. For migraine may repeat in 2hrs if not better Max 2/24hr  2   SUMAtriptan 6 MG/0.5ML  SOAJ as needed.   3   No current facility-administered medications for this visit.    No Known Allergies  Family History  Problem Relation Age of Onset   Arthritis Father    Hyperlipidemia Father    Hypertension Father    Arthritis Maternal Grandmother    Stroke Maternal Grandfather    Cancer Paternal Grandmother        Ovarian    Cancer Paternal Aunt        ovarian cancer   Esophageal cancer Paternal Uncle    Colon cancer Neg Hx    Stomach cancer Neg Hx    Rectal cancer Neg Hx     Social History   Socioeconomic History   Marital status: Married    Spouse name: Not on file   Number of children: 2   Years of education: Not on file    Highest education level: Not on file  Occupational History   Occupation: Press photographer Rep  Tobacco Use   Smoking status: Never   Smokeless tobacco: Never  Vaping Use   Vaping Use: Never used  Substance and Sexual Activity   Alcohol use: Yes    Alcohol/week: 0.0 standard drinks    Comment: rare   Drug use: No   Sexual activity: Yes    Partners: Male    Birth control/protection: Pill  Other Topics Concern   Not on file  Social History Narrative   Not on file   Social Determinants of Health   Financial Resource Strain: Not on file  Food Insecurity: Not on file  Transportation Needs: Not on file  Physical Activity: Not on file  Stress: Not on file  Social Connections: Not on file  Intimate Partner Violence: Not on file     Constitutional:  Denies fever, malaise, fatigue, headaches or abrupt weight changes.  HEENT: Denies eye pain, eye redness, ear pain, ringing in the ears, wax buildup, runny nose, nasal congestion, bloody nose, or sore throat. Respiratory: Denies difficulty breathing, shortness of breath, cough or sputum production.   Cardiovascular: Denies chest pain, chest tightness, palpitations or swelling in the hands or feet.  Gastrointestinal:  Denies abdominal pain, bloating, constipation, diarrhea or blood in the stool.  GU: Denies urgency, frequency, pain with urination, burning sensation, blood in urine, odor or discharge. Musculoskeletal: Denies decrease in range of motion, difficulty with gait, muscle pain or joint pain and swelling.  Skin: Denies redness, rashes, lesions or ulcercations.  Neurological: Denies dizziness, difficulty with memory, difficulty with speech or problems with balance and coordination.  Psych: Patient has a history of anxiety.  Denies depression, SI/HI.  No other specific complaints in a complete review of systems (except as listed in HPI above).  Objective:   Physical Exam  BP 100/70 (BP Location: Right Arm, Patient Position: Sitting, Cuff  Size: Normal)   Pulse 95   Temp 97.8 F (36.6 C) (Temporal)   Resp 17   Ht _0  (1.676 m)   Wt 140 lb 3.2 oz (63.6 kg)   LMP  (LMP Unknown)   SpO2 100%   BMI 22.63 kg/m   Wt Readings from Last 3 Encounters:  09/19/20 163 lb (73.9 kg)  09/18/20 165 lb (74.8 kg)  06/06/20 167 lb 1.7 oz (75.8 kg)    General: Appears her stated age, well developed, well nourished in NAD. Skin: Warm, dry and intact. No rashes, lesions or ulcerations noted. HEENT: Head: normal shape and size; Eyes: sclera white and EOMs intact;  Neck:  Neck supple, trachea midline. No masses, lumps or thyromegaly present.  Cardiovascular: Normal rate and rhythm. S1,S2 noted.  No murmur, rubs or gallops noted. No JVD or BLE edema.  Pulmonary/Chest: Normal effort and positive vesicular breath sounds. No respiratory distress. No wheezes, rales or ronchi noted.  Abdomen: Soft and nontender. Normal bowel sounds. No distention or masses noted. Liver, spleen and kidneys non palpable. Musculoskeletal: Strength 5/5 BUE/BLE. No difficulty with gait.  Neurological: Alert and oriented. Cranial nerves II-XII grossly intact. Coordination normal.  Psychiatric: Mood and affect normal. Behavior is normal. Judgment and thought content normal.     BMET    Component Value Date/Time   NA 137 09/19/2020 1057   K 4.3 09/19/2020 1057   CL 103 09/19/2020 1057   CO2 25 09/19/2020 1057   GLUCOSE 118 (H) 09/19/2020 1057   BUN 19 09/19/2020 1057   CREATININE 0.62 09/19/2020 1057   CREATININE 0.74 02/01/2020 1422   CALCIUM 9.5 09/19/2020 1057   GFRNONAA >60 02/29/2016 1902   GFRAA >60 02/29/2016 1902    Lipid Panel     Component Value Date/Time   CHOL 186 03/06/2020 0838   TRIG 67.0 03/06/2020 0838   HDL 53.60 03/06/2020 0838   CHOLHDL 3 03/06/2020 0838   VLDL 13.4 03/06/2020 0838   LDLCALC 119 (H) 03/06/2020 0838    CBC    Component Value Date/Time   WBC 6.5 01/20/2021 0820   RBC 4.13 01/20/2021 0820   HGB 12.8  01/20/2021 0820   HCT 39.1 01/20/2021 0820   PLT 320 01/20/2021 0820   MCV 94.7 01/20/2021 0820   MCH 31.0 01/20/2021 0820   MCHC 32.7 01/20/2021 0820   RDW 11.7 01/20/2021 0820   LYMPHSABS 1,333 01/20/2021 0820   MONOABS 0.6 09/19/2020 1057   EOSABS 111 01/20/2021 0820   BASOSABS 39 01/20/2021 0820    Hgb A1C Lab Results  Component Value Date   HGBA1C 5.9 03/06/2020           Assessment & Plan:   Preventative Health Maintenance:  She declines flu shot today Tetanus UTD She declines COVID-vaccine Pap smear UTD Mammogram scheduled Encouraged her to consume a balanced diet and exercise regimen Advised her to see an eye doctor and dentist annually We will check CBC, c-Met, lipid, A1c and hep C today  RTC in 1 year, sooner if needed Webb Silversmith, NP This visit occurred during the SARS-CoV-2 public health emergency.  Safety protocols were in place, including screening questions prior to the visit, additional usage of staff PPE, and extensive cleaning of exam room while observing appropriate contact time as indicated for disinfecting solutions.

## 2021-03-23 NOTE — Assessment & Plan Note (Signed)
Improved Continue Emgality, OCP's, Imitrex and Phenergan She will continue to see neurology, will follow

## 2021-03-24 LAB — HEMOGLOBIN A1C
Hgb A1c MFr Bld: 5.4 % of total Hgb (ref <5.7)
Mean Plasma Glucose: 108 mg/dL
eAG (mmol/L): 6 mmol/L

## 2021-03-24 LAB — IRON,TIBC AND FERRITIN PANEL
%SAT: 18 % (calc) (ref 16–45)
Ferritin: 57 ng/mL (ref 16–232)
Iron: 63 ug/dL (ref 40–190)
TIBC: 345 mcg/dL (calc) (ref 250–450)

## 2021-03-24 LAB — COMPREHENSIVE METABOLIC PANEL
AG Ratio: 1.8 (calc) (ref 1.0–2.5)
ALT: 17 U/L (ref 6–29)
AST: 13 U/L (ref 10–30)
Albumin: 3.9 g/dL (ref 3.6–5.1)
Alkaline phosphatase (APISO): 32 U/L (ref 31–125)
BUN: 23 mg/dL (ref 7–25)
CO2: 26 mmol/L (ref 20–32)
Calcium: 8.8 mg/dL (ref 8.6–10.2)
Chloride: 106 mmol/L (ref 98–110)
Creat: 0.54 mg/dL (ref 0.50–0.99)
Globulin: 2.2 g/dL (calc) (ref 1.9–3.7)
Glucose, Bld: 86 mg/dL (ref 65–99)
Potassium: 4.1 mmol/L (ref 3.5–5.3)
Sodium: 138 mmol/L (ref 135–146)
Total Bilirubin: 0.3 mg/dL (ref 0.2–1.2)
Total Protein: 6.1 g/dL (ref 6.1–8.1)

## 2021-03-24 LAB — CBC
HCT: 35.6 % (ref 35.0–45.0)
Hemoglobin: 11.2 g/dL — ABNORMAL LOW (ref 11.7–15.5)
MCH: 30.5 pg (ref 27.0–33.0)
MCHC: 31.5 g/dL — ABNORMAL LOW (ref 32.0–36.0)
MCV: 97 fL (ref 80.0–100.0)
MPV: 10.4 fL (ref 7.5–12.5)
Platelets: 280 10*3/uL (ref 140–400)
RBC: 3.67 10*6/uL — ABNORMAL LOW (ref 3.80–5.10)
RDW: 11.8 % (ref 11.0–15.0)
WBC: 7.7 10*3/uL (ref 3.8–10.8)

## 2021-03-24 LAB — LIPID PANEL
Cholesterol: 166 mg/dL (ref <200)
HDL: 50 mg/dL (ref >=50)
LDL Cholesterol (Calc): 101 mg/dL (calc) — ABNORMAL HIGH
Non-HDL Cholesterol (Calc): 116 mg/dL (calc) (ref <130)
Total CHOL/HDL Ratio: 3.3 (calc) (ref <5.0)
Triglycerides: 60 mg/dL (ref <150)

## 2021-03-24 LAB — HEPATITIS C ANTIBODY
Hepatitis C Ab: NONREACTIVE
SIGNAL TO CUT-OFF: 0 (ref <1.00)

## 2021-03-24 LAB — HM PAP SMEAR: HM Pap smear: NEGATIVE

## 2021-03-24 LAB — TSH: TSH: 2.04 mIU/L

## 2021-03-25 ENCOUNTER — Other Ambulatory Visit: Payer: Self-pay | Admitting: Internal Medicine

## 2021-04-02 ENCOUNTER — Other Ambulatory Visit: Payer: Self-pay

## 2021-04-02 ENCOUNTER — Ambulatory Visit
Admission: RE | Admit: 2021-04-02 | Discharge: 2021-04-02 | Disposition: A | Discharge status: Home / Self Care | Payer: 59 | Source: Ambulatory Visit

## 2021-04-02 DIAGNOSIS — Z1231 Encounter for screening mammogram for malignant neoplasm of breast: Secondary | ICD-10-CM

## 2021-05-12 ENCOUNTER — Ambulatory Visit (INDEPENDENT_AMBULATORY_CARE_PROVIDER_SITE_OTHER): Payer: Self-pay | Admitting: Dermatology

## 2021-05-12 ENCOUNTER — Encounter: Payer: Self-pay | Admitting: Dermatology

## 2021-05-12 ENCOUNTER — Other Ambulatory Visit: Payer: Self-pay

## 2021-05-12 DIAGNOSIS — L988 Other specified disorders of the skin and subcutaneous tissue: Secondary | ICD-10-CM

## 2021-05-12 NOTE — Patient Instructions (Signed)

## 2021-05-12 NOTE — Progress Notes (Signed)
   Follow-Up Visit   Subjective  Robin West is a 40 y.o. female who presents for the following: Facial Elastosis (Face, pt presents for botox today).  The following portions of the chart were reviewed this encounter and updated as appropriate:   Tobacco  Allergies  Meds  Problems  Med Hx  Surg Hx  Fam Hx     Review of Systems:  No other skin or systemic complaints except as noted in HPI or Assessment and Plan.  Objective  Well appearing patient in no apparent distress; mood and affect are within normal limits.  A focused examination was performed including face. Relevant physical exam findings are noted in the Assessment and Plan.  Head - Anterior (Face) Rhytides and volume loss.       Assessment & Plan  Elastosis of skin Head - Anterior (Face)  Botox 45 units injected as marked:  - Frown complex 20 units - Crow's feet 5 units each total of 10 units - Forehead 15 units  Filling material injection - Head - Anterior (Face) Location: Frown complex, forehead, bil crow's feet  Informed consent: Discussed risks (infection, pain, bleeding, bruising, swelling, allergic reaction, paralysis of nearby muscles, eyelid droop, double vision, neck weakness, difficulty breathing, headache, undesirable cosmetic result, and need for additional treatment) and benefits of the procedure, as well as the alternatives.  Informed consent was obtained.  Preparation: The area was cleansed with alcohol.  Procedure Details:  Botox was injected into the dermis with a 30-gauge needle. Pressure applied to any bleeding. Ice packs offered for swelling.  Lot Number:  [id number] Expiration:  02/2023  Total Units Injected:  45  Plan: Patient was instructed to remain upright for 4 hours. Patient was instructed to avoid massaging the face and avoid vigorous exercise for the rest of the day. Tylenol may be used for headache.  Allow 2 weeks before returning to clinic for additional dosing as needed.  Patient will call for any problems.   Return for 3-22m Botox.  I, Othelia Pulling, RMA, am acting as scribe for Sarina Ser, MD . Documentation: I have reviewed the above documentation for accuracy and completeness, and I agree with the above.  Sarina Ser, MD

## 2021-06-03 ENCOUNTER — Ambulatory Visit: Payer: Self-pay | Admitting: *Deleted

## 2021-06-03 ENCOUNTER — Emergency Department: Payer: 59

## 2021-06-03 ENCOUNTER — Emergency Department
Admission: EM | Admit: 2021-06-03 | Discharge: 2021-06-04 | Disposition: A | Discharge status: Home / Self Care | Payer: 59 | Attending: Emergency Medicine | Admitting: Emergency Medicine

## 2021-06-03 ENCOUNTER — Other Ambulatory Visit: Payer: Self-pay

## 2021-06-03 DIAGNOSIS — Z20822 Contact with and (suspected) exposure to covid-19: Secondary | ICD-10-CM | POA: Insufficient documentation

## 2021-06-03 DIAGNOSIS — R1013 Epigastric pain: Secondary | ICD-10-CM

## 2021-06-03 DIAGNOSIS — R072 Precordial pain: Secondary | ICD-10-CM | POA: Diagnosis not present

## 2021-06-03 DIAGNOSIS — R079 Chest pain, unspecified: Secondary | ICD-10-CM

## 2021-06-03 LAB — BASIC METABOLIC PANEL
Anion gap: 5 (ref 5–15)
BUN: 25 mg/dL — ABNORMAL HIGH (ref 6–20)
CO2: 24 mmol/L (ref 22–32)
Calcium: 9.2 mg/dL (ref 8.9–10.3)
Chloride: 105 mmol/L (ref 98–111)
Creatinine, Ser: 0.6 mg/dL (ref 0.44–1.00)
GFR, Estimated: >60 mL/min (ref >60)
Glucose, Bld: 97 mg/dL (ref 70–99)
Potassium: 3.6 mmol/L (ref 3.5–5.1)
Sodium: 134 mmol/L — ABNORMAL LOW (ref 135–145)

## 2021-06-03 LAB — TROPONIN I (HIGH SENSITIVITY)
Troponin I (High Sensitivity): <2 ng/L (ref <18)
Troponin I (High Sensitivity): <2 ng/L (ref <18)

## 2021-06-03 LAB — CBC
HCT: 36.7 % (ref 36.0–46.0)
Hemoglobin: 12.7 g/dL (ref 12.0–15.0)
MCH: 31.2 pg (ref 26.0–34.0)
MCHC: 34.6 g/dL (ref 30.0–36.0)
MCV: 90.2 fL (ref 80.0–100.0)
Platelets: 358 10*3/uL (ref 150–400)
RBC: 4.07 MIL/uL (ref 3.87–5.11)
RDW: 11.9 % (ref 11.5–15.5)
WBC: 8.8 10*3/uL (ref 4.0–10.5)
nRBC: 0 % (ref 0.0–0.2)

## 2021-06-03 LAB — HEPATIC FUNCTION PANEL
ALT: 19 U/L (ref 0–44)
AST: 14 U/L — ABNORMAL LOW (ref 15–41)
Albumin: 3.9 g/dL (ref 3.5–5.0)
Alkaline Phosphatase: 31 U/L — ABNORMAL LOW (ref 38–126)
Bilirubin, Direct: <0.1 mg/dL (ref 0.0–0.2)
Total Bilirubin: 0.6 mg/dL (ref 0.3–1.2)
Total Protein: 7 g/dL (ref 6.5–8.1)

## 2021-06-03 LAB — LIPASE, BLOOD: Lipase: 35 U/L (ref 11–51)

## 2021-06-03 MED ORDER — FAMOTIDINE IN NACL 20-0.9 MG/50ML-% IV SOLN
20.0000 mg | Freq: Once | INTRAVENOUS | Status: AC
  Administered 2021-06-04: 20 mg via INTRAVENOUS
  Filled 2021-06-03: qty 50

## 2021-06-03 NOTE — ED Provider Notes (Signed)
  Emergency Medicine Provider Triage Evaluation Note  Robin West , a 40 y.o.female,  was evaluated in triage.  Pt complains of epigastric pain.  Patient states she began feeling epigastric pain and tingliness under her arm and chest that started this morning.  She does not currently have a headache at this time, but endorses a history of migraines that have caused numbness and tingling this in her extremities in the past.  She additionally has a history of anxiety.  She is not experiencing a headache at this time.    Review of Systems  Positive: Epigastric pain Negative: Denies fever, chest pain, vomiting  Physical Exam   Vitals:   06/03/21 1641  BP: (!) 149/87  Pulse: 87  Resp: 20  Temp: 98.2 F (36.8 C)  SpO2: 98%   Gen:   Awake, no distress   Resp:  Normal effort  MSK:   Moves extremities without difficulty  Other:  No facial droop, abnormal gait, or slurred speech.  Cranial nerves II through XII intact.  Medical Decision Making  Given the patient's initial medical screening exam, the following diagnostic evaluation has been ordered. The patient will be placed in the appropriate treatment space, once one is available, to complete the evaluation and treatment. I have discussed the plan of care with the patient and I have advised the patient that an ED physician or mid-level practitioner will reevaluate their condition after the test results have been received, as the results may give them additional insight into the type of treatment they may need.    Diagnostics: Labs, EKG, CXR.  Treatments: none immediately   Teodoro Spray, [place] 06/03/21 1702    Duffy Bruce, MD 06/03/21 2200

## 2021-06-03 NOTE — ED Triage Notes (Signed)
Pt to ED for epigastric pain, migraine, tingling to left arm and tingling under chin. States has hx of stroke like migraine. Clear speech. Ambulatory. Hx anxiety.  Noticed sx upon waking up, states sx progressed through out day.  Took migraine meds and anxiety meds PTA  Orders [place] PA

## 2021-06-03 NOTE — Telephone Encounter (Signed)
Pt reports chest pain mid chest, left shoulder pain, left arm "Tingly" Also reports face and fingers left hand tingly. States mild headache, onset this AM. States H/O migraines, "But this doesn't feel like that, too mild." Did take Imitrex at lunch time and "Anxiety med" CHest tightness remains.States has felt "Funky" all day, "Not right."  Advised ED. Pt states will follow disposition.      Reason for Disposition  [1] Chest pain lasts > 5 minutes AND [2] occurred in past 3 days (72 hours) (Exception: feels exactly the same as previously diagnosed heartburn and has accompanying sour taste in mouth)  Answer Assessment - Initial Assessment Questions 1. LOCATION: "Where does it hurt?"       Mid chest 2. RADIATION: "Does the pain go anywhere else?" (e.g., into neck, jaw, arms, back)     Left arm tingling, left hand, fingers, left shoulder pain 3. ONSET: "When did the chest pain begin?" (Minutes, hours or days)      "Felt funky all day." 4. PATTERN "Does the pain come and go, or has it been constant since it started?"  "Does it get worse with exertion?"      *No Answer* 5. DURATION: "How long does it last" (e.g., seconds, minutes, hours)     *No Answer* 6. SEVERITY: "How bad is the pain?"  (e.g., Scale 1-10; mild, moderate, or severe)    - MILD (1-3): doesn't interfere with normal activities     - MODERATE (4-7): interferes with normal activities or awakens from sleep    - SEVERE (8-10): excruciating pain, unable to do any normal activities       *No Answer* 7. CARDIAC RISK FACTORS: "Do you have any history of heart problems or risk factors for heart disease?" (e.g., angina, prior heart attack; diabetes, high blood pressure, high cholesterol, smoker, or strong family history of heart disease)     *No Answer* 8. PULMONARY RISK FACTORS: "Do you have any history of lung disease?"  (e.g., blood clots in lung, asthma, emphysema, birth control pills)     *No Answer* 9. CAUSE: "What do you think is  causing the chest pain?"     *No Answer* 10. OTHER SYMPTOMS: "Do you have any other symptoms?" (e.g., dizziness, nausea, vomiting, sweating, fever, difficulty breathing, cough)      Face tingly. Chest tight 11. PREGNANCY: "Is there any chance you are pregnant?" "When was your last menstrual period?"       *No Answer*  Protocols used: Chest Pain-A-AH

## 2021-06-03 NOTE — ED Provider Notes (Signed)
[facility] Emergency Department Provider Note   ____________________________________________   Event Date/Time   First MD Initiated Contact with Patient 06/03/21 2318     (approximate)  I have reviewed the triage vital signs and the nursing notes.   HISTORY  Chief Complaint Chest Pain and Numbness    HPI Robin West is a 40 y.o. female who presents to the ED from home with a chief complaint of substernal chest/epigastric discomfort, migraine, tingling to left arm.  Patient has a history of complex migraines with numbness.  Awoke yesterday morning just not feeling well.  Had a migraine with left arm tingling for which she took Imitrex which resolved the symptoms.  Subsequently developed lower chest/epigastric discomfort, nonradiating, not exacerbated by eating or deep inspiration, and not associated with nausea or vomiting.  Denies recent fever, cough, shortness of breath, dysuria or diarrhea.  Takes OCPs and had a trip to [facility] approximately 1 month ago.  Denies leg swelling or pain.     Past Medical History:  Diagnosis Date   Allergy    Anemia    Anxiety    Chicken pox    Dysplastic nevus 02/09/2017   Mid back spinal at bra line. Mild atypia, lateral and deep margins involved.   Dysplastic nevus 02/09/2017   Right proximal post. lat. thigh. Mild atypia, close to margin.   Frequent headaches    History of dysplastic nevus 01/31/2020   left mid buttocks, moderate   IBS (irritable bowel syndrome)     Patient Active Problem List   Diagnosis Date Noted   Pure hypercholesterolemia 03/23/2021   Prediabetes 03/23/2021   Iron deficiency anemia 03/23/2021   Anxiety 03/06/2020   GERD (gastroesophageal reflux disease) 06/05/2019   Primary insomnia 11/06/2018   Frequent headaches 10/22/2014   IBS (irritable bowel syndrome) 10/22/2014    Past Surgical History:  Procedure Laterality Date   FLEXIBLE SIGMOIDOSCOPY N/A 06/06/2020   Procedure:  FLEXIBLE SIGMOIDOSCOPY;  Surgeon: Lavena Bullion, DO;  Location: [place];  Service: Gastroenterology;  Laterality: N/A;   GANGLION CYST EXCISION Left 01/08/2016   Procedure: EXCISION DORSAL GANGLION LEFT WRIST;  Surgeon: Daryll Brod, MD;  Location: [place];  Service: Orthopedics;  Laterality: Left;   HEMORROIDECTOMY  2004   HEMOSTASIS CLIP PLACEMENT  06/06/2020   Procedure: HEMOSTASIS CLIP PLACEMENT;  Surgeon: Lavena Bullion, DO;  Location: [facility];  Service: Gastroenterology;;   TONSILLECTOMY  2010   WISDOM TOOTH EXTRACTION      Prior to Admission medications   Medication Sig Start Date End Date Taking? Authorizing Provider  busPIRone (BUSPAR) 5 MG tablet TAKE 1 TABLET BY MOUTH TWICE A DAY AS NEEDED 03/25/21   Jearld Fenton, NP  ferrous sulfate 324 MG TBEC Take 324 mg by mouth.    [provider]  Galcanezumab-gnlm (EMGALITY) 120 MG/ML SOSY Inject 120 mg into the skin every 28 (twenty-eight) days.    [provider]  JUNEL FE 1/20 1-20 MG-MCG tablet Take 1 tablet by mouth daily. 05/19/17   [provider]  Multiple Vitamin (MULTI-VITAMINS) TABS Take by mouth.    [provider]  omeprazole (PRILOSEC) 40 MG capsule TAKE 1 CAPSULE BY MOUTH EVERY DAY Patient taking differently: Take 40 mg by mouth as needed. 09/23/20   Jearld Fenton, NP  OVER THE COUNTER MEDICATION Elderberry Syrup- daily    [provider]  Polyethylene Glycol 3350 (MIRALAX PO) Take by mouth as needed.  [provider]  Probiotic Product (PROBIOTIC PO) Take by mouth. BYE BYE Bloat- one daily    [provider]  promethazine (PHENERGAN) 25 MG tablet Take 25 mg by mouth as needed.  11/20/15   [provider]  SUMAtriptan (IMITREX) 100 MG tablet Take 1 tablet by mouth as needed. For migraine may repeat in 2hrs if not better Max 2/24hr 09/18/14   [provider]  SUMAtriptan 6 MG/0.5ML SOAJ as needed.  11/21/15    [provider]    Allergies Patient has no known allergies.  Family History  Problem Relation Age of Onset   Arthritis Father    Hyperlipidemia Father    Hypertension Father    Arthritis Maternal Grandmother    Stroke Maternal Grandfather    Cancer Paternal Grandmother        Ovarian    Cancer Paternal Aunt        ovarian cancer   Esophageal cancer Paternal Uncle    Colon cancer Neg Hx    Stomach cancer Neg Hx    Rectal cancer Neg Hx     Social History Social History   Tobacco Use   Smoking status: Never   Smokeless tobacco: Never  Vaping Use   Vaping Use: Never used  Substance Use Topics   Alcohol use: Yes    Alcohol/week: 0.0 standard drinks    Comment: rare   Drug use: No    Review of Systems  Constitutional: No fever/chills Eyes: No visual changes. ENT: No sore throat. Cardiovascular: Positive for chest pain. Respiratory: Denies shortness of breath. Gastrointestinal: Positive for abdominal pain.  No nausea, no vomiting.  No diarrhea.  No constipation. Genitourinary: Negative for dysuria. Musculoskeletal: Negative for back pain. Skin: Negative for rash. Neurological: Positive for headache. Negative for focal weakness or numbness.   ____________________________________________   PHYSICAL EXAM:  VITAL SIGNS: ED Triage Vitals  Enc Vitals Group     BP 06/03/21 1641 (!) 149/87     Pulse Rate 06/03/21 1641 87     Resp 06/03/21 1641 20     Temp 06/03/21 1641 98.2 F (36.8 C)     Temp Source 06/03/21 1641 Oral     SpO2 06/03/21 1641 98 %     Weight --      Height --      Head Circumference --      Peak Flow --      Pain Score 06/03/21 1645 6     Pain Loc --      Pain Edu? --      Excl. in [place]? --     Constitutional: Alert and oriented. Well appearing and in no acute distress. Eyes: Conjunctivae are normal. PERRL. EOMI. Head: Atraumatic. Nose: No congestion/rhinnorhea. Mouth/Throat: Mucous membranes are moist.   Neck: No stridor.   No carotid bruits.  Supple neck without meningismus. Cardiovascular: Normal rate, regular rhythm. Grossly normal heart sounds.  Good peripheral circulation. Respiratory: Normal respiratory effort.  No retractions. Lungs CTAB. Gastrointestinal: Soft and mildly tender to palpation epigastrium without rebound or guarding. No distention. No abdominal bruits. No CVA tenderness. Musculoskeletal: No lower extremity tenderness nor edema.  No joint effusions. Neurologic:  Normal speech and language. No gross focal neurologic deficits are appreciated. No gait instability. Skin:  Skin is warm, dry and intact. No rash noted. Psychiatric: Mood and affect are normal. Speech and behavior are normal.  ____________________________________________   LABS (all labs ordered are listed, but only abnormal results are displayed)  Labs Reviewed  BASIC METABOLIC PANEL - Abnormal; Notable for the following components:      Result Value   Sodium 134 (*)    BUN 25 (*)    All other components within normal limits  HEPATIC FUNCTION PANEL - Abnormal; Notable for the following components:   AST 14 (*)    Alkaline Phosphatase 31 (*)    All other components within normal limits  RESP PANEL BY RT-PCR (FLU A&B, COVID) ARPGX2  CBC  LIPASE, BLOOD  D-DIMER, QUANTITATIVE (NOT AT [facility])  POC URINE PREG, ED  TROPONIN I (HIGH SENSITIVITY)  TROPONIN I (HIGH SENSITIVITY)   ____________________________________________  EKG  ED ECG REPORT I, Legacie Dillingham J, the attending physician, personally viewed and interpreted this ECG.   Date: 06/04/2021  EKG Time: 1644  Rate: 87  Rhythm: normal sinus rhythm  Axis: Normal  Intervals:none  ST&T Change: Nonspecific  ____________________________________________  RADIOLOGY I, Jru Pense J, personally viewed and evaluated these images (plain radiographs) as part of my medical decision making, as well as reviewing the written report by the radiologist.  ED MD interpretation: No  acute cardiopulmonary process; ultrasound demonstrates polyp and likely hemangioma  Official radiology report(s): DG Chest 2 View  Result Date: 06/03/2021 CLINICAL DATA:  Chest pain. EXAM: CHEST - 2 VIEW COMPARISON:  Chest radiograph dated 02/12/2017. FINDINGS: The heart size and mediastinal contours are within normal limits. Both lungs are clear. The visualized skeletal structures are unremarkable. IMPRESSION: No active cardiopulmonary disease. Electronically Signed   By: Anner Crete M.D.   On: 06/03/2021 17:12   US ABDOMEN LIMITED RUQ (LIVER/GB)  Result Date: 06/04/2021 CLINICAL DATA:  Epigastric pain x1 day. EXAM: ULTRASOUND ABDOMEN LIMITED RIGHT UPPER QUADRANT COMPARISON:  None. FINDINGS: Gallbladder: A 3.1 mm x 3.4 mm x 3.1 mm nonshadowing echogenic polyp is seen along the nondependent portion of the gallbladder wall. No gallstones or wall thickening visualized (1.8 mm). No sonographic Murphy sign noted by sonographer. Common bile duct: Diameter: 2.3 mm Liver: A 1.1 cm x 0.7 cm x 1.1 cm hypoechoic area is seen within the right lobe of the liver. This is adjacent to the gallbladder fossa. No abnormal flow is seen within this region on color Doppler evaluation. Within normal limits in parenchymal echogenicity. Portal vein is patent on color Doppler imaging with normal direction of blood flow towards the liver. Other: None. IMPRESSION: 1. Small gallbladder polyp, likely benign. No additional follow-up or imaging is recommended. This recommendation follows ACR consensus guidelines: White Paper of the ACR Incidental Findings Committee II on Gallbladder and Biliary Findings. J Am Coll Radiol 2013:;10:953-956. 2. Hypoechoic liver lesion which may represent a small hemangioma. Follow-up with nonemergent hepatic MRI is recommended. Electronically Signed   By: Virgina Norfolk M.D.   On: 06/04/2021 01:39    ____________________________________________   PROCEDURES  Procedure(s) performed (including  Critical Care):  Procedures   ____________________________________________   INITIAL IMPRESSION / ASSESSMENT AND PLAN / ED COURSE  As part of my medical decision making, I reviewed the following data within the [place] notes reviewed and incorporated, Labs reviewed, EKG interpreted, Old chart reviewed, Radiograph reviewed, and Notes from prior ED visits     40 year old female presenting with substernal chest/epigastric pain. Differential diagnosis includes, but is not limited to, ACS, aortic dissection, pulmonary embolism, cardiac tamponade, pneumothorax, pneumonia, pericarditis, myocarditis, GI-related causes including esophagitis/gastritis, and musculoskeletal chest wall pain.     Initial migraine with left arm numbness experienced yesterday resolved during  the day and patient not currently complaining of such.  Low suspicion for aortic dissection given patient's normal blood pressure and well-appearing presentation.  EKG and initial troponin unremarkable.  Given location of patient's pain, will add LFTs/lipase.  Will also check D-dimer, respiratory panel.  Obtain right upper quadrant abdominal ultrasound.  Administer IV Pepcid and reassess.  Clinical Course as of 06/04/21 5615  Thu Jun 04, 2021  0210 Pain improved.  Updated patient on all test results.  We discussed proceeding with CT abdomen/pelvis.  Patient states she had EGD done recently by her GI doctor and would prefer to hold off additional imaging at this time.  Does states she has not been taking her omeprazole daily and instead taking it as needed.  She will restart her omeprazole daily and follow-up with her GI doctor for the symptoms as well as follow-up for probable liver hemangioma.  Strict return precautions given.  Patient verbalizes understanding agrees with plan of care. [JS]    Clinical Course User Index [JS] Paulette Blanch, MD     ____________________________________________   FINAL  CLINICAL IMPRESSION(S) / ED DIAGNOSES  Final diagnoses:  Chest pain, unspecified type  Epigastric pain     ED Discharge Orders     None        Note:  This document was prepared using Dragon voice recognition software and may include unintentional dictation errors.    Paulette Blanch, MD 06/04/21 [phone]

## 2021-06-04 ENCOUNTER — Emergency Department: Payer: 59

## 2021-06-04 DIAGNOSIS — R932 Abnormal findings on diagnostic imaging of liver and biliary tract: Secondary | ICD-10-CM

## 2021-06-04 DIAGNOSIS — K769 Liver disease, unspecified: Secondary | ICD-10-CM

## 2021-06-04 LAB — D-DIMER, QUANTITATIVE: D-Dimer, Quant: 0.43 ug/mL-FEU (ref 0.00–0.50)

## 2021-06-04 LAB — RESP PANEL BY RT-PCR (FLU A&B, COVID) ARPGX2
Influenza A by PCR: NEGATIVE
Influenza B by PCR: NEGATIVE
SARS Coronavirus 2 by RT PCR: NEGATIVE

## 2021-06-04 NOTE — Discharge Instructions (Addendum)
Restart Omeprazole daily.  Please follow-up with your GI doctor regarding findings on your ultrasound of likely liver hemangioma requiring MRI follow-up.  Return to the ER for worsening symptoms, persistent vomiting, difficulty breathing or other concerns.

## 2021-06-04 NOTE — Telephone Encounter (Signed)
MRI liver order in epic. Secure staff message sent to radiology scheduling to contact patient to set up her appt. Pt notified of recommendations via my chart.

## 2021-06-04 NOTE — Telephone Encounter (Signed)
Will review ED note 

## 2021-06-05 ENCOUNTER — Telehealth: Payer: Self-pay | Admitting: General Surgery

## 2021-06-05 NOTE — Telephone Encounter (Signed)
Spoke with the patient per Dr Raina Mina he would like to see her in the office if there is no improvement. The patient states she is feeling much better and doesn't feel as if she needs to come in for an appointment.

## 2021-06-09 ENCOUNTER — Encounter: Payer: Self-pay | Admitting: Internal Medicine

## 2021-06-10 MED ORDER — FLUCONAZOLE 150 MG PO TABS: 150.0000 mg | ORAL_TABLET | Freq: Once | ORAL | 0 refills | Status: AC

## 2021-06-19 ENCOUNTER — Ambulatory Visit
Admission: RE | Admit: 2021-06-19 | Discharge: 2021-06-19 | Disposition: A | Discharge status: Home / Self Care | Payer: 59 | Source: Ambulatory Visit | Attending: Physician Assistant | Admitting: Physician Assistant

## 2021-06-19 DIAGNOSIS — K769 Liver disease, unspecified: Secondary | ICD-10-CM | POA: Diagnosis not present

## 2021-06-19 DIAGNOSIS — R932 Abnormal findings on diagnostic imaging of liver and biliary tract: Secondary | ICD-10-CM | POA: Diagnosis present

## 2021-06-19 MED ORDER — GADOBUTROL 1 MMOL/ML IV SOLN
6.0000 mL | Freq: Once | INTRAVENOUS | Status: AC | PRN
  Administered 2021-06-19: 6 mL via INTRAVENOUS

## 2021-06-22 ENCOUNTER — Telehealth: Payer: Self-pay | Admitting: Physician Assistant

## 2021-06-22 NOTE — Telephone Encounter (Signed)
Returned call to patient. See 06/19/21 MRI liver report for details.

## 2021-06-22 NOTE — Telephone Encounter (Signed)
Patient returned your call.  She asked if you'd please try to call her again.  Thank you.

## 2021-07-27 ENCOUNTER — Encounter: Payer: Self-pay | Admitting: Internal Medicine

## 2021-07-27 MED ORDER — BUSPIRONE HCL 10 MG PO TABS: 10.0000 mg | ORAL_TABLET | Freq: Two times a day (BID) | ORAL | 2 refills | Status: DC

## 2021-09-09 ENCOUNTER — Encounter: Payer: Self-pay | Admitting: Internal Medicine

## 2021-09-14 ENCOUNTER — Ambulatory Visit: Payer: 59 | Admitting: Internal Medicine

## 2021-09-14 ENCOUNTER — Encounter: Payer: Self-pay | Admitting: Internal Medicine

## 2021-09-14 ENCOUNTER — Other Ambulatory Visit: Payer: Self-pay

## 2021-09-14 VITALS — BP 112/89 | HR 85 | Temp 97.1°F | Wt 147.0 lb

## 2021-09-14 DIAGNOSIS — F419 Anxiety disorder, unspecified: Secondary | ICD-10-CM

## 2021-09-14 DIAGNOSIS — R0789 Other chest pain: Secondary | ICD-10-CM

## 2021-09-14 DIAGNOSIS — F439 Reaction to severe stress, unspecified: Secondary | ICD-10-CM

## 2021-09-14 DIAGNOSIS — M5441 Lumbago with sciatica, right side: Secondary | ICD-10-CM

## 2021-09-14 MED ORDER — CYCLOBENZAPRINE HCL 5 MG PO TABS: 5.0000 mg | ORAL_TABLET | Freq: Three times a day (TID) | ORAL | 0 refills | Status: DC | PRN

## 2021-09-14 MED ORDER — PREDNISONE 10 MG PO TABS: ORAL_TABLET | ORAL | 0 refills | Status: DC

## 2021-09-14 NOTE — Patient Instructions (Signed)

## 2021-09-14 NOTE — Progress Notes (Signed)
? ?Subjective:  ? ? Patient ID: Robin West, female    DOB: 09-Jul-1980, 41 y.o.   MRN: [id number] ? ?HPI ? ?Patient presents the clinic today with complaint of back pain.  This started week ago.  She describes the pain as tight, sharp and shooting. The pain radiates into her right leg. She denies numbness, tingling or weakness. She denies loss of bowel or bladder control. She denies prior back injury or back surgery. She has been taking Advil OTC with minimal relief of symptoms.  She has been seeing a Restaurant manager, fast food as well. ? ?She also reports persistent chest tightness.  This has been an ongoing issue.  She describes this chest tightness as a squeezing.  The pain does not radiate into her back, jaw or upper extremities.  She is taking omeprazole daily for reflux and denies breakthrough.  She is taking BuSpar as prescribed.  She does have a history of anxiety and is under a lot of stress right now. ? ?Review of Systems ? ?   ?Past Medical History:  ?Diagnosis Date  ? Allergy   ? Anemia   ? Anxiety   ? Chicken pox   ? Dysplastic nevus 02/09/2017  ? Mid back spinal at bra line. Mild atypia, lateral and deep margins involved.  ? Dysplastic nevus 02/09/2017  ? Right proximal post. lat. thigh. Mild atypia, close to margin.  ? Frequent headaches   ? History of dysplastic nevus 01/31/2020  ? left mid buttocks, moderate  ? IBS (irritable bowel syndrome)   ? ? ?Current Outpatient Medications  ?Medication Sig Dispense Refill  ? busPIRone (BUSPAR) 10 MG tablet Take 1 tablet (10 mg total) by mouth 2 (two) times daily. 60 tablet 2  ? ferrous sulfate 324 MG TBEC Take 324 mg by mouth.    ? Galcanezumab-gnlm (EMGALITY) 120 MG/ML SOSY Inject 120 mg into the skin every 28 (twenty-eight) days.    ? JUNEL FE 1/20 1-20 MG-MCG tablet Take 1 tablet by mouth daily.  3  ? Multiple Vitamin (MULTI-VITAMINS) TABS Take by mouth.    ? omeprazole (PRILOSEC) 40 MG capsule TAKE 1 CAPSULE BY MOUTH EVERY DAY (Patient taking differently: Take 40 mg  by mouth as needed.) 90 capsule 1  ? OVER THE COUNTER MEDICATION Elderberry Syrup- daily    ? Polyethylene Glycol 3350 (MIRALAX PO) Take by mouth as needed.    ? Probiotic Product (PROBIOTIC PO) Take by mouth. BYE BYE Bloat- one daily    ? promethazine (PHENERGAN) 25 MG tablet Take 25 mg by mouth as needed.     ? SUMAtriptan (IMITREX) 100 MG tablet Take 1 tablet by mouth as needed. For migraine may repeat in 2hrs if not better Max 2/24hr  2  ? SUMAtriptan 6 MG/0.5ML SOAJ as needed.   3  ? ?No current facility-administered medications for this visit.  ? ? ?No Known Allergies ? ?Family History  ?Problem Relation Age of Onset  ? Arthritis Father   ? Hyperlipidemia Father   ? Hypertension Father   ? Arthritis Maternal Grandmother   ? Stroke Maternal Grandfather   ? Cancer Paternal Grandmother   ?     Ovarian   ? Cancer Paternal Aunt   ?     ovarian cancer  ? Esophageal cancer Paternal Uncle   ? Colon cancer Neg Hx   ? Stomach cancer Neg Hx   ? Rectal cancer Neg Hx   ? ? ?Social History  ? ?Socioeconomic History  ? Marital status:  Married  ?  Spouse name: Not on file  ? Number of children: 2  ? Years of education: Not on file  ? Highest education level: Not on file  ?Occupational History  ? Occupation: Press photographer Rep  ?Tobacco Use  ? Smoking status: Never  ? Smokeless tobacco: Never  ?Vaping Use  ? Vaping Use: Never used  ?Substance and Sexual Activity  ? Alcohol use: Yes  ?  Alcohol/week: 0.0 standard drinks  ?  Comment: rare  ? Drug use: No  ? Sexual activity: Yes  ?  Partners: Male  ?  Birth control/protection: Pill  ?Other Topics Concern  ? Not on file  ?Social History Narrative  ? Not on file  ? ?Social Determinants of Health  ? ?Financial Resource Strain: Not on file  ?Food Insecurity: Not on file  ?Transportation Needs: Not on file  ?Physical Activity: Not on file  ?Stress: Not on file  ?Social Connections: Not on file  ?Intimate Partner Violence: Not on file  ? ? ? ?Constitutional: Denies fever, malaise, fatigue,  headache or abrupt weight changes.  ?Respiratory: Denies difficulty breathing, shortness of breath, cough or sputum production.   ?Cardiovascular: Patient reports chest tightness.  Denies chest pain, palpitations or swelling in the hands or feet.  ?Gastrointestinal: Denies abdominal pain, bloating, constipation, diarrhea or blood in the stool.  ?Musculoskeletal: Patient reports low back pain.  Denies decrease in range of motion, difficulty with gait, or joint swelling.  ?Skin: Denies redness, rashes, lesions or ulcercations.  ?Neurological: Denies numbness, tingling, weakness or problems with balance and coordination.  ?Psych: Patient reports stress and anxiety.  Denies depression, SI/HI. ? ?No other specific complaints in a complete review of systems (except as listed in HPI above). ? ?Objective:  ? Physical Exam ?BP 112/89 (BP Location: Right Arm, Patient Position: Sitting, Cuff Size: Large)   Pulse 85   Temp (!) 97.1 ?F (36.2 ?C) (Temporal)   Wt 147 lb (66.7 kg)   SpO2 100%   BMI 23.73 kg/m?  ? ?Wt Readings from Last 3 Encounters:  ?06/03/21 140 lb (63.5 kg)  ?03/23/21 140 lb 3.2 oz (63.6 kg)  ?09/19/20 163 lb (73.9 kg)  ? ? ?General: Appears her stated age, well developed, well nourished in NAD. ?Skin: Warm, dry and intact.  ?Cardiovascular: Normal rate. ?Pulmonary/Chest: Normal effort and positive vesicular breath sounds.  ? ?Musculoskeletal: Normal flexion, extension, rotation and lateral bending of the spine.  No bony tenderness noted over the lumbar spine or paralumbar muscles.  Strength 5/5 BLE.  Able to stand on tiptoes and heels.  No difficulty with gait.  ?Neurological: Alert and oriented.  ?Psychiatric: Mood and affect normal. Behavior is normal. Judgment and thought content normal.  ? ? ?BMET ?   ?Component Value Date/Time  ? NA 134 (L) 06/03/2021 1643  ? K 3.6 06/03/2021 1643  ? CL 105 06/03/2021 1643  ? CO2 24 06/03/2021 1643  ? GLUCOSE 97 06/03/2021 1643  ? BUN 25 (H) 06/03/2021 1643  ?  CREATININE 0.60 06/03/2021 1643  ? CREATININE 0.54 03/23/2021 0844  ? CALCIUM 9.2 06/03/2021 1643  ? GFRNONAA >60 06/03/2021 1643  ? GFRAA >60 02/29/2016 1902  ? ? ?Lipid Panel  ?   ?Component Value Date/Time  ? CHOL 166 03/23/2021 0844  ? TRIG 60 03/23/2021 0844  ? HDL 50 03/23/2021 0844  ? CHOLHDL 3.3 03/23/2021 0844  ? VLDL 13.4 03/06/2020 0838  ? LDLCALC 101 (H) 03/23/2021 0844  ? ? ?CBC ?   ?  Component Value Date/Time  ? WBC 8.8 06/03/2021 1643  ? RBC 4.07 06/03/2021 1643  ? HGB 12.7 06/03/2021 1643  ? HCT 36.7 06/03/2021 1643  ? PLT 358 06/03/2021 1643  ? MCV 90.2 06/03/2021 1643  ? MCH 31.2 06/03/2021 1643  ? MCHC 34.6 06/03/2021 1643  ? RDW 11.9 06/03/2021 1643  ? LYMPHSABS 1,333 01/20/2021 0820  ? MONOABS 0.6 09/19/2020 1057  ? EOSABS 111 01/20/2021 0820  ? BASOSABS 39 01/20/2021 0820  ? ? ?Hgb A1C ?Lab Results  ?Component Value Date  ? HGBA1C 5.4 03/23/2021  ? ? ? ? ? ? ?   ?Assessment & Plan:  ? ?Low Back Pain with Right-Sided Sciatica: ? ?Encouraged stretching and ice ?Rx for Pred taper x6 days ?Rx for Flexeril 5 mg every 8 hours as needed-sedation caution given ?Continue to seek chiropractic care ? ?Chest Tightness secondary to Anxiety and Stress: ? ?Persistent despite treatment with PPI and antianxiety medication ?Discussed obtaining CT cardiac score but she will hold off at this time ?Discussed referral to cardiology for stress testing but she will hold off at this time ?We will continue to monitor ? ?RTC in 6 months for your annual exam ? ?Webb Silversmith, NP ?This visit occurred during the SARS-CoV-2 public health emergency.  Safety protocols were in place, including screening questions prior to the visit, additional usage of staff PPE, and extensive cleaning of exam room while observing appropriate contact time as indicated for disinfecting solutions.  ? ?

## 2021-09-15 ENCOUNTER — Ambulatory Visit (INDEPENDENT_AMBULATORY_CARE_PROVIDER_SITE_OTHER): Payer: Self-pay | Admitting: Dermatology

## 2021-09-15 DIAGNOSIS — L988 Other specified disorders of the skin and subcutaneous tissue: Secondary | ICD-10-CM

## 2021-09-15 NOTE — Progress Notes (Signed)
? ?  Follow-Up Visit ?  ?Subjective  ?Robin West is a 41 y.o. female who presents for the following: Facial Elastosis (Face, pt presents for botox). ? ?The following portions of the chart were reviewed this encounter and updated as appropriate:  ? Tobacco  Allergies  Meds  Problems  Med Hx  Surg Hx  Fam Hx   ?  ?Review of Systems:  No other skin or systemic complaints except as noted in HPI or Assessment and Plan. ? ?Objective  ?Well appearing patient in no apparent distress; mood and affect are within normal limits. ? ?A focused examination was performed including face. Relevant physical exam findings are noted in the Assessment and Plan. ? ?face ?Rhytides and volume loss.  ? ? ? ? ? ?Assessment & Plan  ?Elastosis of skin ?face ? ?Botox 45 units injected as marked:  ?- Frown complex 20 units ?- Crow's feet 5 units each  ?- Forehead 15 units ? ?Botox Injection - face ?Location: Frown complex, forehead, bil crow's feet ? ?Informed consent: Discussed risks (infection, pain, bleeding, bruising, swelling, allergic reaction, paralysis of nearby muscles, eyelid droop, double vision, neck weakness, difficulty breathing, headache, undesirable cosmetic result, and need for additional treatment) and benefits of the procedure, as well as the alternatives.  Informed consent was obtained. ? ?Preparation: The area was cleansed with alcohol. ? ?Procedure Details:  Botox was injected into the dermis with a 30-gauge needle. Pressure applied to any bleeding. Ice packs offered for swelling. ? ?Lot Number:  [id number] ?Expiration:  09/2023 ? ?Total Units Injected:  45 ? ?Plan: Patient was instructed to remain upright for 4 hours. Patient was instructed to avoid massaging the face and avoid vigorous exercise for the rest of the day. Tylenol may be used for headache.  Allow 2 weeks before returning to clinic for additional dosing as needed. Patient will call for any problems. ? ? ? ?Return for 3-21mBotox. ? ?I, SOthelia Pulling  RMA, am acting as scribe for DSarina Ser MD . ?Documentation: I have reviewed the above documentation for accuracy and completeness, and I agree with the above. ? ?DSarina Ser MD ? ?

## 2021-09-15 NOTE — Patient Instructions (Signed)

## 2021-09-20 ENCOUNTER — Other Ambulatory Visit: Payer: Self-pay | Admitting: Internal Medicine

## 2021-09-21 ENCOUNTER — Encounter: Payer: Self-pay | Admitting: Dermatology

## 2021-09-22 NOTE — Telephone Encounter (Signed)
Requested Prescriptions  ?Pending Prescriptions Disp Refills  ?? omeprazole (PRILOSEC) 40 MG capsule [Pharmacy Med Name: OMEPRAZOLE '40MG'$  CAPSULES] 90 capsule 1  ?  Sig: TAKE 1 CAPSULE BY MOUTH EVERY DAY  ?  ? Gastroenterology: Proton Pump Inhibitors Passed - 09/20/2021  8:00 AM  ?  ?  Passed - Valid encounter within last 12 months  ?  Recent Outpatient Visits   ?      ? 1 week ago Acute right-sided low back pain with right-sided sciatica  ? [facility] [place], Coralie Keens, NP  ? 6 months ago Encounter for general adult medical examination with abnormal findings  ? [facility] [place], Coralie Keens, NP  ?  ?  ? ?  ?  ?  ? ?

## 2021-10-18 ENCOUNTER — Other Ambulatory Visit: Payer: Self-pay | Admitting: Internal Medicine

## 2021-10-19 NOTE — Telephone Encounter (Signed)
Requested Prescriptions  ?Pending Prescriptions Disp Refills  ?? busPIRone (BUSPAR) 10 MG tablet [Pharmacy Med Name: BUSPIRONE '10MG'$  TABLETS] 60 tablet 2  ?  Sig: TAKE 1 TABLET(10 MG) BY MOUTH TWICE DAILY  ?  ? Psychiatry: Anxiolytics/Hypnotics - Non-controlled Passed - 10/18/2021  7:35 AM  ?  ?  Passed - Valid encounter within last 12 months  ?  Recent Outpatient Visits   ?      ? 1 month ago Acute right-sided low back pain with right-sided sciatica  ? [facility] [place], Coralie Keens, NP  ? 7 months ago Encounter for general adult medical examination with abnormal findings  ? [facility] [place], Coralie Keens, NP  ?  ?  ? ?  ?  ?  ? ? ?

## 2021-11-03 ENCOUNTER — Encounter: Payer: Self-pay | Admitting: Internal Medicine

## 2021-11-04 NOTE — Progress Notes (Signed)
Please see the MyChart message reply(ies) for my assessment and plan.  ?  ?This patient gave consent for this Medical Advice Message and is aware that it may result in a bill to [employer], as well as the possibility of receiving a bill for a co-payment or deductible. They are an established patient, but are not seeking medical advice exclusively about a problem treated during an in person or video visit in the last seven days. I did not recommend an in person or video visit within seven days of my reply.  ?  ?I spent a total of 3 minutes cumulative time within 7 days through [employer]. ? ?Webb Silversmith, NP  ? ?

## 2021-11-23 ENCOUNTER — Other Ambulatory Visit: Payer: Self-pay | Admitting: Obstetrics and Gynecology

## 2021-11-23 DIAGNOSIS — R102 Pelvic and perineal pain: Secondary | ICD-10-CM

## 2021-12-04 ENCOUNTER — Ambulatory Visit
Admission: RE | Admit: 2021-12-04 | Discharge: 2021-12-04 | Disposition: A | Discharge status: Home / Self Care | Payer: 59 | Source: Ambulatory Visit | Attending: Obstetrics and Gynecology | Admitting: Obstetrics and Gynecology

## 2021-12-04 DIAGNOSIS — R102 Pelvic and perineal pain: Secondary | ICD-10-CM | POA: Diagnosis not present

## 2022-01-12 ENCOUNTER — Ambulatory Visit: Payer: 59 | Admitting: Dermatology

## 2022-02-16 ENCOUNTER — Ambulatory Visit (INDEPENDENT_AMBULATORY_CARE_PROVIDER_SITE_OTHER): Payer: 59 | Admitting: Dermatology

## 2022-02-16 DIAGNOSIS — L578 Other skin changes due to chronic exposure to nonionizing radiation: Secondary | ICD-10-CM | POA: Diagnosis not present

## 2022-02-16 DIAGNOSIS — Z86018 Personal history of other benign neoplasm: Secondary | ICD-10-CM

## 2022-02-16 DIAGNOSIS — L814 Other melanin hyperpigmentation: Secondary | ICD-10-CM

## 2022-02-16 DIAGNOSIS — L821 Other seborrheic keratosis: Secondary | ICD-10-CM

## 2022-02-16 DIAGNOSIS — Z1283 Encounter for screening for malignant neoplasm of skin: Secondary | ICD-10-CM | POA: Diagnosis not present

## 2022-02-16 DIAGNOSIS — D229 Melanocytic nevi, unspecified: Secondary | ICD-10-CM

## 2022-02-16 DIAGNOSIS — D2221 Melanocytic nevi of right ear and external auricular canal: Secondary | ICD-10-CM

## 2022-02-16 DIAGNOSIS — D1800 Hemangioma unspecified site: Secondary | ICD-10-CM

## 2022-02-16 DIAGNOSIS — L988 Other specified disorders of the skin and subcutaneous tissue: Secondary | ICD-10-CM

## 2022-02-16 NOTE — Progress Notes (Signed)
Follow-Up Visit   Subjective  Robin West is a 41 y.o. female who presents for the following: Annual Exam (History of dysplastic nevi - The patient presents for Total-Body Skin Exam (TBSE) for skin cancer screening and mole check.  The patient has spots, moles and lesions to be evaluated, some may be new or changing and the patient has concerns that these could be cancer./) and Facial Elastosis (Botox today).  The following portions of the chart were reviewed this encounter and updated as appropriate:   Tobacco  Allergies  Meds  Problems  Med Hx  Surg Hx  Fam Hx     Review of Systems:  No other skin or systemic complaints except as noted in HPI or Assessment and Plan.  Objective  Well appearing patient in no apparent distress; mood and affect are within normal limits.  A full examination was performed including scalp, head, eyes, ears, nose, lips, neck, chest, axillae, abdomen, back, buttocks, bilateral upper extremities, bilateral lower extremities, hands, feet, fingers, toes, fingernails, and toenails. All findings within normal limits unless otherwise noted below.  Face Rhytides and volume loss.      Right Ear Sup Helix 0.4 x 0.2 cm brown macule      Assessment & Plan   Lentigines - Scattered tan macules - Due to sun exposure - Benign-appearing, observe - Recommend daily broad spectrum sunscreen SPF 30+ to sun-exposed areas, reapply every 2 hours as needed. - Call for any changes  Seborrheic Keratoses - Stuck-on, waxy, tan-brown papules and/or plaques  - Benign-appearing - Discussed benign etiology and prognosis. - Observe - Call for any changes  Melanocytic Nevi - Tan-brown and/or pink-flesh-colored symmetric macules and papules - Benign appearing on exam today - Observation - Call clinic for new or changing moles - Recommend daily use of broad spectrum spf 30+ sunscreen to sun-exposed areas.   Hemangiomas - Red papules - Discussed benign  nature - Observe - Call for any changes  Actinic Damage - Chronic condition, secondary to cumulative UV/sun exposure - diffuse scaly erythematous macules with underlying dyspigmentation - Recommend daily broad spectrum sunscreen SPF 30+ to sun-exposed areas, reapply every 2 hours as needed.  - Staying in the shade or wearing long sleeves, sun glasses (UVA+UVB protection) and wide brim hats (4-inch brim around the entire circumference of the hat) are also recommended for sun protection.  - Call for new or changing lesions.  Skin cancer screening performed today.  Elastosis of skin Face Botox today - 45 units  Frown Complex - 20 units Crow's Feet - 5 units each side Forehead - 15 units  Botox Injection - Face Location: See attached image  Informed consent: Discussed risks (infection, pain, bleeding, bruising, swelling, allergic reaction, paralysis of nearby muscles, eyelid droop, double vision, neck weakness, difficulty breathing, headache, undesirable cosmetic result, and need for additional treatment) and benefits of the procedure, as well as the alternatives.  Informed consent was obtained.  Preparation: The area was cleansed with alcohol.  Procedure Details:  Botox was injected into the dermis with a 30-gauge needle. Pressure applied to any bleeding. Ice packs offered for swelling.  Lot Number:  [id number] Expiration:  05/2024  Total Units Injected:  45  Plan: Patient was instructed to remain upright for 4 hours. Patient was instructed to avoid massaging the face and avoid vigorous exercise for the rest of the day. Tylenol may be used for headache.  Allow 2 weeks before returning to clinic for additional dosing as needed.  Patient will call for any problems.  Nevus Right Ear Sup Helix See photo Benign-appearing.  Observation.  Call clinic for new or changing lesions.  Recommend daily use of broad spectrum spf 30+ sunscreen to sun-exposed areas.   Return for Botox in 3-4  months.  I, Ashok Cordia, CMA, am acting as scribe for Sarina Ser, MD . Documentation: I have reviewed the above documentation for accuracy and completeness, and I agree with the above.  Sarina Ser, MD

## 2022-02-16 NOTE — Patient Instructions (Signed)
Due to recent changes in healthcare laws, you may see results of your pathology and/or laboratory studies on MyChart before the doctors have had a chance to review them. We understand that in some cases there may be results that are confusing or concerning to you. Please understand that not all results are received at the same time and often the doctors may need to interpret multiple results in order to provide you with the best plan of care or course of treatment. Therefore, we ask that you please give us 2 business days to thoroughly review all your results before contacting the office for clarification. Should we see a critical lab result, you will be contacted sooner.   If You Need Anything After Your Visit  If you have any questions or concerns for your doctor, please call our main line at [phone] and press option 4 to reach your doctor's medical assistant. If no one answers, please leave a voicemail as directed and we will return your call as soon as possible. Messages left after 4 pm will be answered the following business day.   You may also send us a message via MyChart. We typically respond to MyChart messages within 1-2 business days.  For prescription refills, please ask your pharmacy to contact our office. Our fax number is [phone].  If you have an urgent issue when the clinic is closed that cannot wait until the next business day, you can page your doctor at the number below.    Please note that while we do our best to be available for urgent issues outside of office hours, we are not available 24/7.   If you have an urgent issue and are unable to reach us, you may choose to seek medical care at your doctor's office, retail clinic, urgent care center, or emergency room.  If you have a medical emergency, please immediately call 911 or go to the emergency department.  Pager Numbers  - Dr. Kowalski: [phone]  - Dr. Moye: [phone]  - Dr. Stewart:  [phone]  In the event of inclement weather, please call our main line at [phone] for an update on the status of any delays or closures.  Dermatology Medication Tips: Please keep the boxes that topical medications come in in order to help keep track of the instructions about where and how to use these. Pharmacies typically print the medication instructions only on the boxes and not directly on the medication tubes.   If your medication is too expensive, please contact our office at [phone] option 4 or send us a message through MyChart.   We are unable to tell what your co-pay for medications will be in advance as this is different depending on your insurance coverage. However, we may be able to find a substitute medication at lower cost or fill out paperwork to get insurance to cover a needed medication.   If a prior authorization is required to get your medication covered by your insurance company, please allow us 1-2 business days to complete this process.  Drug prices often vary depending on where the prescription is filled and some pharmacies may offer cheaper prices.  The website www.goodrx.com contains coupons for medications through different pharmacies. The prices here do not account for what the cost may be with help from insurance (it may be cheaper with your insurance), but the website can give you the price if you did not use any insurance.  - You can print the associated coupon and take it with   your prescription to the pharmacy.  - You may also stop by our office during regular business hours and pick up a GoodRx coupon card.  - If you need your prescription sent electronically to a different pharmacy, notify our office through [facility] MyChart or by phone at [phone] option 4.     Si Usted Necesita Algo Despus de Su Visita  Tambin puede enviarnos un mensaje a travs de MyChart. Por lo general respondemos a los mensajes de MyChart en el transcurso de 1 a 2  das hbiles.  Para renovar recetas, por favor pida a su farmacia que se ponga en contacto con nuestra oficina. Nuestro nmero de fax es el [phone].  Si tiene un asunto urgente cuando la clnica est cerrada y que no puede esperar hasta el siguiente da hbil, puede llamar/localizar a su doctor(a) al nmero que aparece a continuacin.   Por favor, tenga en cuenta que aunque hacemos todo lo posible para estar disponibles para asuntos urgentes fuera del horario de oficina, no estamos disponibles las 24 horas del da, los 7 das de la semana.   Si tiene un problema urgente y no puede comunicarse con nosotros, puede optar por buscar atencin mdica  en el consultorio de su doctor(a), en una clnica privada, en un centro de atencin urgente o en una sala de emergencias.  Si tiene una emergencia mdica, por favor llame inmediatamente al 911 o vaya a la sala de emergencias.  Nmeros de bper  - Dr. Kowalski: [phone]  - Dra. Moye: [phone]  - Dra. Stewart: [phone]  En caso de inclemencias del tiempo, por favor llame a nuestra lnea principal al [phone] para una actualizacin sobre el estado de cualquier retraso o cierre.  Consejos para la medicacin en dermatologa: Por favor, guarde las cajas en las que vienen los medicamentos de uso tpico para ayudarle a seguir las instrucciones sobre dnde y cmo usarlos. Las farmacias generalmente imprimen las instrucciones del medicamento slo en las cajas y no directamente en los tubos del medicamento.   Si su medicamento es muy caro, por favor, pngase en contacto con nuestra oficina llamando al [phone] y presione la opcin 4 o envenos un mensaje a travs de MyChart.   No podemos decirle cul ser su copago por los medicamentos por adelantado ya que esto es diferente dependiendo de la cobertura de su seguro. Sin embargo, es posible que podamos encontrar un medicamento sustituto a menor costo o llenar un formulario para que el  seguro cubra el medicamento que se considera necesario.   Si se requiere una autorizacin previa para que su compaa de seguros cubra su medicamento, por favor permtanos de 1 a 2 das hbiles para completar este proceso.  Los precios de los medicamentos varan con frecuencia dependiendo del lugar de dnde se surte la receta y alguna farmacias pueden ofrecer precios ms baratos.  El sitio web www.goodrx.com tiene cupones para medicamentos de diferentes farmacias. Los precios aqu no tienen en cuenta lo que podra costar con la ayuda del seguro (puede ser ms barato con su seguro), pero el sitio web puede darle el precio si no utiliz ningn seguro.  - Puede imprimir el cupn correspondiente y llevarlo con su receta a la farmacia.  - Tambin puede pasar por nuestra oficina durante el horario de atencin regular y recoger una tarjeta de cupones de GoodRx.  - Si necesita que su receta se enve electrnicamente a una farmacia diferente, informe a nuestra oficina a travs de MyChart de [place]   o por telfono llamando al [phone] y presione la opcin 4.  

## 2022-02-17 ENCOUNTER — Encounter: Payer: 59 | Admitting: Dermatology

## 2022-02-17 ENCOUNTER — Other Ambulatory Visit: Payer: Self-pay | Admitting: Internal Medicine

## 2022-02-17 DIAGNOSIS — Z1231 Encounter for screening mammogram for malignant neoplasm of breast: Secondary | ICD-10-CM

## 2022-02-23 ENCOUNTER — Encounter: Payer: Self-pay | Admitting: Dermatology

## 2022-03-23 ENCOUNTER — Other Ambulatory Visit: Payer: Self-pay | Admitting: Internal Medicine

## 2022-03-24 NOTE — Telephone Encounter (Signed)
dose dc'd 07/27/21 R. Baity NP   Requested Prescriptions  Refused Prescriptions Disp Refills  . busPIRone (BUSPAR) 5 MG tablet [Pharmacy Med Name: BUSPIRONE '5MG'$  TABLETS] 180 tablet     Sig: TAKE 1 TABLET BY MOUTH TWICE DAILY AS NEEDED     Psychiatry: Anxiolytics/Hypnotics - Non-controlled Passed - 03/23/2022  1:45 PM      Passed - Valid encounter within last 12 months    Recent Outpatient Visits          6 months ago Acute right-sided low back pain with right-sided sciatica   [facility] [place], Coralie Keens, NP   1 year ago Encounter for general adult medical examination with abnormal findings   [facility] [place], Coralie Keens, NP      Future Appointments            In 11 months Ralene Bathe, MD [place]

## 2022-03-27 ENCOUNTER — Other Ambulatory Visit: Payer: Self-pay | Admitting: Internal Medicine

## 2022-03-29 NOTE — Telephone Encounter (Signed)
Requested medications are due for refill today.  Unsure  Requested medications are on the active medications list.  yes  Last refill. 10/19/2021  Future visit scheduled.   no  Notes to clinic.  Medication was refused recently. Please review for refill.    Requested Prescriptions  Pending Prescriptions Disp Refills   busPIRone (BUSPAR) 10 MG tablet [Pharmacy Med Name: BUSPIRONE '10MG'$  TABLETS] 60 tablet 2    Sig: TAKE 1 TABLET(10 MG) BY MOUTH TWICE DAILY     Psychiatry: Anxiolytics/Hypnotics - Non-controlled Passed - 03/27/2022  3:45 PM      Passed - Valid encounter within last 12 months    Recent Outpatient Visits           6 months ago Acute right-sided low back pain with right-sided sciatica   [facility] [place], Coralie Keens, NP   1 year ago Encounter for general adult medical examination with abnormal findings   [facility] [place], Coralie Keens, NP       Future Appointments             In 11 months Ralene Bathe, MD [place]

## 2022-04-05 ENCOUNTER — Ambulatory Visit
Admission: RE | Admit: 2022-04-05 | Discharge: 2022-04-05 | Disposition: A | Discharge status: Home / Self Care | Payer: 59 | Source: Ambulatory Visit

## 2022-04-05 DIAGNOSIS — Z1231 Encounter for screening mammogram for malignant neoplasm of breast: Secondary | ICD-10-CM

## 2022-04-06 LAB — HM MAMMOGRAPHY

## 2022-04-12 ENCOUNTER — Ambulatory Visit: Payer: 59 | Admitting: Internal Medicine

## 2022-04-12 ENCOUNTER — Ambulatory Visit (INDEPENDENT_AMBULATORY_CARE_PROVIDER_SITE_OTHER): Payer: 59 | Admitting: Internal Medicine

## 2022-04-12 ENCOUNTER — Encounter: Payer: Self-pay | Admitting: Internal Medicine

## 2022-04-12 VITALS — BP 116/80 | HR 82 | Temp 97.1°F | Ht 66.0 in | Wt 153.0 lb

## 2022-04-12 DIAGNOSIS — E538 Deficiency of other specified B group vitamins: Secondary | ICD-10-CM | POA: Diagnosis not present

## 2022-04-12 DIAGNOSIS — Z0001 Encounter for general adult medical examination with abnormal findings: Secondary | ICD-10-CM

## 2022-04-12 DIAGNOSIS — E559 Vitamin D deficiency, unspecified: Secondary | ICD-10-CM | POA: Diagnosis not present

## 2022-04-12 NOTE — Progress Notes (Deleted)
Subjective:    Patient ID: Robin West, female    DOB: [birth year], 41 y.o.   MRN: [id number]  HPI  Patient presents to clinic today for her annual exam.  Flu: 02/2019 Tetanus: 11/2012 COVID: Pap smear: 02/2019 Mammogram: 04/2022 Vision screening: Dentist:  Diet: Exercise:  Review of Systems     Past Medical History:  Diagnosis Date   Allergy    Anemia    Anxiety    Chicken pox    Dysplastic nevus 02/09/2017   Mid back spinal at bra line. Mild atypia, lateral and deep margins involved.   Dysplastic nevus 02/09/2017   Right proximal post. lat. thigh. Mild atypia, close to margin.   Frequent headaches    History of dysplastic nevus 01/31/2020   left mid buttocks, moderate   IBS (irritable bowel syndrome)     Current Outpatient Medications  Medication Sig Dispense Refill   busPIRone (BUSPAR) 10 MG tablet TAKE 1 TABLET(10 MG) BY MOUTH TWICE DAILY 180 tablet 0   cyclobenzaprine (FLEXERIL) 5 MG tablet Take 1 tablet (5 mg total) by mouth 3 (three) times daily as needed for muscle spasms. 20 tablet 0   ferrous sulfate 324 MG TBEC Take 324 mg by mouth.     Galcanezumab-gnlm (EMGALITY) 120 MG/ML SOSY Inject 120 mg into the skin every 28 (twenty-eight) days.     JUNEL FE 1/20 1-20 MG-MCG tablet Take 1 tablet by mouth daily.  3   Multiple Vitamin (MULTI-VITAMINS) TABS Take by mouth.     omeprazole (PRILOSEC) 40 MG capsule TAKE 1 CAPSULE BY MOUTH EVERY DAY 90 capsule 1   Polyethylene Glycol 3350 (MIRALAX PO) Take by mouth as needed.     predniSONE (DELTASONE) 10 MG tablet Take 6 tabs on day 1, 5 tabs on day 2, 4 tabs on day 3, 3 tabs on day 4, 2 tabs on day 5, 1 tab on day 6 21 tablet 0   Probiotic Product (PROBIOTIC PO) Take by mouth. BYE BYE Bloat- one daily     promethazine (PHENERGAN) 25 MG tablet Take 25 mg by mouth as needed.      SUMAtriptan (IMITREX) 100 MG tablet Take 1 tablet by mouth as needed. For migraine may repeat in 2hrs if not better Max 2/24hr  2    SUMAtriptan 6 MG/0.5ML SOAJ as needed.   3   No current facility-administered medications for this visit.    No Known Allergies  Family History  Problem Relation Age of Onset   Arthritis Father    Hyperlipidemia Father    Hypertension Father    Arthritis Maternal Grandmother    Stroke Maternal Grandfather    Cancer Paternal Grandmother        Ovarian    Cancer Paternal Aunt        ovarian cancer   Esophageal cancer Paternal Uncle    Colon cancer Neg Hx    Stomach cancer Neg Hx    Rectal cancer Neg Hx     Social History   Socioeconomic History   Marital status: Married    Spouse name: Not on file   Number of children: 2   Years of education: Not on file   Highest education level: Not on file  Occupational History   Occupation: Press photographer Rep  Tobacco Use   Smoking status: Never   Smokeless tobacco: Never  Vaping Use   Vaping Use: Never used  Substance and Sexual Activity   Alcohol use: Yes    Alcohol/week: 0.0  standard drinks of alcohol    Comment: rare   Drug use: No   Sexual activity: Yes    Partners: Male    Birth control/protection: Pill  Other Topics Concern   Not on file  Social History Narrative   Not on file   Social Determinants of Health   Financial Resource Strain: Not on file  Food Insecurity: Not on file  Transportation Needs: Not on file  Physical Activity: Not on file  Stress: Not on file  Social Connections: Not on file  Intimate Partner Violence: Not on file     Constitutional: Patient reports intermittent headaches.  Denies fever, malaise, fatigue, or abrupt weight changes.  HEENT: Denies eye pain, eye redness, ear pain, ringing in the ears, wax buildup, runny nose, nasal congestion, bloody nose, or sore throat. Respiratory: Denies difficulty breathing, shortness of breath, cough or sputum production.   Cardiovascular: Denies chest pain, chest tightness, palpitations or swelling in the hands or feet.  Gastrointestinal: Denies abdominal  pain, bloating, constipation, diarrhea or blood in the stool.  GU: Denies urgency, frequency, pain with urination, burning sensation, blood in urine, odor or discharge. Musculoskeletal: Denies decrease in range of motion, difficulty with gait, muscle pain or joint pain and swelling.  Skin: Denies redness, rashes, lesions or ulcercations.  Neurological: Patient reports insomnia.  Denies dizziness, difficulty with memory, difficulty with speech or problems with balance and coordination.  Psych: Patient has a history of anxiety.  Denies depression, SI/HI.  No other specific complaints in a complete review of systems (except as listed in HPI above).  Objective:   Physical Exam   There were no vitals taken for this visit. Wt Readings from Last 3 Encounters:  09/14/21 147 lb (66.7 kg)  06/03/21 140 lb (63.5 kg)  03/23/21 140 lb 3.2 oz (63.6 kg)    General: Appears their stated age, well developed, well nourished in NAD. Skin: Warm, dry and intact. No rashes, lesions or ulcerations noted. HEENT: Head: normal shape and size; Eyes: sclera white, no icterus, conjunctiva pink, PERRLA and EOMs intact; Ears: Tm's gray and intact, normal light reflex; Nose: mucosa pink and moist, septum midline; Throat/Mouth: Teeth present, mucosa pink and moist, no exudate, lesions or ulcerations noted.  Neck:  Neck supple, trachea midline. No masses, lumps or thyromegaly present.  Cardiovascular: Normal rate and rhythm. S1,S2 noted.  No murmur, rubs or gallops noted. No JVD or BLE edema. No carotid bruits noted. Pulmonary/Chest: Normal effort and positive vesicular breath sounds. No respiratory distress. No wheezes, rales or ronchi noted.  Abdomen: Soft and nontender. Normal bowel sounds. No distention or masses noted. Liver, spleen and kidneys non palpable. Musculoskeletal: Normal range of motion. No signs of joint swelling. No difficulty with gait.  Neurological: Alert and oriented. Cranial nerves II-XII grossly  intact. Coordination normal.  Psychiatric: Mood and affect normal. Behavior is normal. Judgment and thought content normal.    BMET    Component Value Date/Time   NA 134 (L) 06/03/2021 1643   K 3.6 06/03/2021 1643   CL 105 06/03/2021 1643   CO2 24 06/03/2021 1643   GLUCOSE 97 06/03/2021 1643   BUN 25 (H) 06/03/2021 1643   CREATININE 0.60 06/03/2021 1643   CREATININE 0.54 03/23/2021 0844   CALCIUM 9.2 06/03/2021 1643   GFRNONAA >60 06/03/2021 1643   GFRAA >60 02/29/2016 1902    Lipid Panel     Component Value Date/Time   CHOL 166 03/23/2021 0844   TRIG 60 03/23/2021 0844  HDL 50 03/23/2021 0844   CHOLHDL 3.3 03/23/2021 0844   VLDL 13.4 03/06/2020 0838   LDLCALC 101 (H) 03/23/2021 0844    CBC    Component Value Date/Time   WBC 8.8 06/03/2021 1643   RBC 4.07 06/03/2021 1643   HGB 12.7 06/03/2021 1643   HCT 36.7 06/03/2021 1643   PLT 358 06/03/2021 1643   MCV 90.2 06/03/2021 1643   MCH 31.2 06/03/2021 1643   MCHC 34.6 06/03/2021 1643   RDW 11.9 06/03/2021 1643   LYMPHSABS 1,333 01/20/2021 0820   MONOABS 0.6 09/19/2020 1057   EOSABS 111 01/20/2021 0820   BASOSABS 39 01/20/2021 0820    Hgb A1C Lab Results  Component Value Date   HGBA1C 5.4 03/23/2021           Assessment & Plan:   Preventative Health Maintenance:  Flu shot today Tetanus UTD Encouraged her to get her COVID-vaccine Pap smear UTD Mammogram UTD Encouraged her to consume a balanced diet and exercise regimen Advised her to see an eye doctor and dentist annually We will check CBC, c-Met, lipid, A1c today  RTC in 6 months, follow-up chronic conditions Webb Silversmith, NP

## 2022-04-12 NOTE — Patient Instructions (Signed)

## 2022-04-12 NOTE — Progress Notes (Signed)
Subjective:    Patient ID: Robin West, female    DOB: 1980-09-09, 41 y.o.   MRN: [id number]  HPI  Flu: 02/2019 Tetanus: 11/2012 Covid: never Pap smear: 03/2021 Mammogram: 04/2022 Colonoscopy: 04/2020 Vision screening: annually Dentist: biannually  Diet: She does eat meat. She consumes fruits and veggies. She tries to avoid fried foods. She drinks mostly coffee, water Exercise: Walking  Review of Systems     Past Medical History:  Diagnosis Date   Allergy    Anemia    Anxiety    Chicken pox    Dysplastic nevus 02/09/2017   Mid back spinal at bra line. Mild atypia, lateral and deep margins involved.   Dysplastic nevus 02/09/2017   Right proximal post. lat. thigh. Mild atypia, close to margin.   Frequent headaches    History of dysplastic nevus 01/31/2020   left mid buttocks, moderate   IBS (irritable bowel syndrome)     Current Outpatient Medications  Medication Sig Dispense Refill   busPIRone (BUSPAR) 10 MG tablet TAKE 1 TABLET(10 MG) BY MOUTH TWICE DAILY 180 tablet 0   cyclobenzaprine (FLEXERIL) 5 MG tablet Take 1 tablet (5 mg total) by mouth 3 (three) times daily as needed for muscle spasms. 20 tablet 0   ferrous sulfate 324 MG TBEC Take 324 mg by mouth.     Galcanezumab-gnlm (EMGALITY) 120 MG/ML SOSY Inject 120 mg into the skin every 28 (twenty-eight) days.     JUNEL FE 1/20 1-20 MG-MCG tablet Take 1 tablet by mouth daily.  3   Multiple Vitamin (MULTI-VITAMINS) TABS Take by mouth.     omeprazole (PRILOSEC) 40 MG capsule TAKE 1 CAPSULE BY MOUTH EVERY DAY 90 capsule 1   Polyethylene Glycol 3350 (MIRALAX PO) Take by mouth as needed.     predniSONE (DELTASONE) 10 MG tablet Take 6 tabs on day 1, 5 tabs on day 2, 4 tabs on day 3, 3 tabs on day 4, 2 tabs on day 5, 1 tab on day 6 21 tablet 0   Probiotic Product (PROBIOTIC PO) Take by mouth. BYE BYE Bloat- one daily     promethazine (PHENERGAN) 25 MG tablet Take 25 mg by mouth as needed.      SUMAtriptan (IMITREX) 100  MG tablet Take 1 tablet by mouth as needed. For migraine may repeat in 2hrs if not better Max 2/24hr  2   SUMAtriptan 6 MG/0.5ML SOAJ as needed.   3   No current facility-administered medications for this visit.    No Known Allergies  Family History  Problem Relation Age of Onset   Hypertension Mother    Hyperlipidemia Father    Hypertension Father    Arthritis Maternal Grandmother    Stroke Maternal Grandfather    Ovarian cancer Paternal Grandmother    Ovarian cancer Paternal Aunt    Esophageal cancer Paternal Uncle    Healthy Half-Sister    Healthy Half-Sister    Healthy Half-Sister    Colon cancer Neg Hx    Stomach cancer Neg Hx    Rectal cancer Neg Hx     Social History   Socioeconomic History   Marital status: Married    Spouse name: Not on file   Number of children: 2   Years of education: Not on file   Highest education level: Not on file  Occupational History   Occupation: Sales Rep  Tobacco Use   Smoking status: Never   Smokeless tobacco: Never  Vaping Use   Vaping Use:  Never used  Substance and Sexual Activity   Alcohol use: Yes    Alcohol/week: 0.0 standard drinks of alcohol    Comment: rare   Drug use: No   Sexual activity: Yes    Partners: Male    Birth control/protection: Pill  Other Topics Concern   Not on file  Social History Narrative   Not on file   Social Determinants of Health   Financial Resource Strain: Not on file  Food Insecurity: Not on file  Transportation Needs: Not on file  Physical Activity: Not on file  Stress: Not on file  Social Connections: Not on file  Intimate Partner Violence: Not on file     Constitutional: Patient reports intermittent headaches, fatigue.  Denies fever, malaise, or abrupt weight changes.  HEENT: Denies eye pain, eye redness, ear pain, ringing in the ears, wax buildup, runny nose, nasal congestion, bloody nose, or sore throat. Respiratory: Denies difficulty breathing, shortness of breath, cough or  sputum production.   Cardiovascular: Denies chest pain, chest tightness, palpitations or swelling in the hands or feet.  Gastrointestinal: Denies abdominal pain, bloating, constipation, diarrhea or blood in the stool.  GU: Patient reports decreased sex drive.  Denies urgency, frequency, pain with urination, burning sensation, blood in urine, odor or discharge. Musculoskeletal: Denies decrease in range of motion, difficulty with gait, muscle pain or joint pain and swelling.  Skin: Denies redness, rashes, lesions or ulcercations.  Neurological: Patient reports insomnia.  Denies dizziness, difficulty with memory, difficulty with speech or problems with balance and coordination.  Psych: Patient has a history of anxiety.  Denies depression, SI/HI.  No other specific complaints in a complete review of systems (except as listed in HPI above).  Objective:   Physical Exam  BP 116/80 (BP Location: Right Arm, Patient Position: Sitting, Cuff Size: Normal)   Pulse 82   Temp (!) 97.1 F (36.2 C) (Temporal)   Ht _0  (1.676 m)   Wt 153 lb (69.4 kg)   SpO2 100%   BMI 24.69 kg/m  Wt Readings from Last 3 Encounters:  04/12/22 153 lb (69.4 kg)  09/14/21 147 lb (66.7 kg)  06/03/21 140 lb (63.5 kg)    General: Appears her stated age, well developed, well nourished in NAD. Skin: Warm, dry and intact. HEENT: Head: normal shape and size; Eyes: sclera white, no icterus, conjunctiva pink, PERRLA and EOMs intact;  Neck:  Neck supple, trachea midline. No masses, lumps or thyromegaly present.  Cardiovascular: Normal rate and rhythm. S1,S2 noted.  No murmur, rubs or gallops noted. No JVD or BLE edema.  Pulmonary/Chest: Normal effort and positive vesicular breath sounds. No respiratory distress. No wheezes, rales or ronchi noted.  Abdomen: Normal bowel sounds. Musculoskeletal: Strength/5 BUE/BLE.  No difficulty with gait.  Neurological: Alert and oriented. Cranial nerves II-XII grossly intact. Coordination  normal.  Psychiatric: Mood and affect normal.  Mildly anxious appearing. Judgment and thought content normal.    BMET    Component Value Date/Time   NA 134 (L) 06/03/2021 1643   K 3.6 06/03/2021 1643   CL 105 06/03/2021 1643   CO2 24 06/03/2021 1643   GLUCOSE 97 06/03/2021 1643   BUN 25 (H) 06/03/2021 1643   CREATININE 0.60 06/03/2021 1643   CREATININE 0.54 03/23/2021 0844   CALCIUM 9.2 06/03/2021 1643   GFRNONAA >60 06/03/2021 1643   GFRAA >60 02/29/2016 1902    Lipid Panel     Component Value Date/Time   CHOL 166 03/23/2021 0844  TRIG 60 03/23/2021 0844   HDL 50 03/23/2021 0844   CHOLHDL 3.3 03/23/2021 0844   VLDL 13.4 03/06/2020 0838   LDLCALC 101 (H) 03/23/2021 0844    CBC    Component Value Date/Time   WBC 8.8 06/03/2021 1643   RBC 4.07 06/03/2021 1643   HGB 12.7 06/03/2021 1643   HCT 36.7 06/03/2021 1643   PLT 358 06/03/2021 1643   MCV 90.2 06/03/2021 1643   MCH 31.2 06/03/2021 1643   MCHC 34.6 06/03/2021 1643   RDW 11.9 06/03/2021 1643   LYMPHSABS 1,333 01/20/2021 0820   MONOABS 0.6 09/19/2020 1057   EOSABS 111 01/20/2021 0820   BASOSABS 39 01/20/2021 0820    Hgb A1C Lab Results  Component Value Date   HGBA1C 5.4 03/23/2021           Assessment & Plan:   Preventative Health Maintenance:  She declines flu shot today Tetanus UTD Encouraged to get her COVID-vaccine Pap smear UTD Mammogram UTD Encouraged her to consume a balanced diet and exercise regimen Advised her to see an eye doctor and dentist annually We will check CBC, c-Met, lipid, vitamin D and B12 today  RTC in 6 months, follow-up chronic conditions Webb Silversmith, NP

## 2022-04-13 LAB — LIPID PANEL
Cholesterol: 199 mg/dL (ref <200)
HDL: 52 mg/dL (ref >=50)
LDL Cholesterol (Calc): 128 mg/dL (calc) — ABNORMAL HIGH
Non-HDL Cholesterol (Calc): 147 mg/dL (calc) — ABNORMAL HIGH (ref <130)
Total CHOL/HDL Ratio: 3.8 (calc) (ref <5.0)
Triglycerides: 89 mg/dL (ref <150)

## 2022-04-13 LAB — COMPLETE METABOLIC PANEL WITH GFR
AG Ratio: 1.6 (calc) (ref 1.0–2.5)
ALT: 15 U/L (ref 6–29)
AST: 11 U/L (ref 10–30)
Albumin: 4.1 g/dL (ref 3.6–5.1)
Alkaline phosphatase (APISO): 39 U/L (ref 31–125)
BUN/Creatinine Ratio: 49 (calc) — ABNORMAL HIGH (ref 6–22)
BUN: 27 mg/dL — ABNORMAL HIGH (ref 7–25)
CO2: 24 mmol/L (ref 20–32)
Calcium: 8.9 mg/dL (ref 8.6–10.2)
Chloride: 105 mmol/L (ref 98–110)
Creat: 0.55 mg/dL (ref 0.50–0.99)
Globulin: 2.5 g/dL (calc) (ref 1.9–3.7)
Glucose, Bld: 96 mg/dL (ref 65–99)
Potassium: 4.2 mmol/L (ref 3.5–5.3)
Sodium: 138 mmol/L (ref 135–146)
Total Bilirubin: 0.3 mg/dL (ref 0.2–1.2)
Total Protein: 6.6 g/dL (ref 6.1–8.1)
eGFR: 117 mL/min/{1.73_m2} (ref >=60)

## 2022-04-13 LAB — CBC
HCT: 35.2 % (ref 35.0–45.0)
Hemoglobin: 11.9 g/dL (ref 11.7–15.5)
MCH: 31.3 pg (ref 27.0–33.0)
MCHC: 33.8 g/dL (ref 32.0–36.0)
MCV: 92.6 fL (ref 80.0–100.0)
MPV: 10.3 fL (ref 7.5–12.5)
Platelets: 334 10*3/uL (ref 140–400)
RBC: 3.8 10*6/uL (ref 3.80–5.10)
RDW: 11.7 % (ref 11.0–15.0)
WBC: 7.5 10*3/uL (ref 3.8–10.8)

## 2022-04-13 LAB — VITAMIN B12: Vitamin B-12: 372 pg/mL (ref 200–1100)

## 2022-04-13 LAB — VITAMIN D 25 HYDROXY (VIT D DEFICIENCY, FRACTURES): Vit D, 25-Hydroxy: 96 ng/mL (ref 30–100)

## 2022-04-15 ENCOUNTER — Ambulatory Visit (INDEPENDENT_AMBULATORY_CARE_PROVIDER_SITE_OTHER): Payer: 59 | Admitting: Dermatology

## 2022-04-15 DIAGNOSIS — L821 Other seborrheic keratosis: Secondary | ICD-10-CM | POA: Diagnosis not present

## 2022-04-15 DIAGNOSIS — L820 Inflamed seborrheic keratosis: Secondary | ICD-10-CM

## 2022-04-15 NOTE — Patient Instructions (Signed)
Due to recent changes in healthcare laws, you may see results of your pathology and/or laboratory studies on MyChart before the doctors have had a chance to review them. We understand that in some cases there may be results that are confusing or concerning to you. Please understand that not all results are received at the same time and often the doctors may need to interpret multiple results in order to provide you with the best plan of care or course of treatment. Therefore, we ask that you please give us 2 business days to thoroughly review all your results before contacting the office for clarification. Should we see a critical lab result, you will be contacted sooner.   If You Need Anything After Your Visit  If you have any questions or concerns for your doctor, please call our main line at [phone] and press option 4 to reach your doctor's medical assistant. If no one answers, please leave a voicemail as directed and we will return your call as soon as possible. Messages left after 4 pm will be answered the following business day.   You may also send us a message via MyChart. We typically respond to MyChart messages within 1-2 business days.  For prescription refills, please ask your pharmacy to contact our office. Our fax number is [phone].  If you have an urgent issue when the clinic is closed that cannot wait until the next business day, you can page your doctor at the number below.    Please note that while we do our best to be available for urgent issues outside of office hours, we are not available 24/7.   If you have an urgent issue and are unable to reach us, you may choose to seek medical care at your doctor's office, retail clinic, urgent care center, or emergency room.  If you have a medical emergency, please immediately call 911 or go to the emergency department.  Pager Numbers  - Dr. Kowalski: [phone]  - Dr. Moye: [phone]  - Dr. Stewart:  [phone]  In the event of inclement weather, please call our main line at [phone] for an update on the status of any delays or closures.  Dermatology Medication Tips: Please keep the boxes that topical medications come in in order to help keep track of the instructions about where and how to use these. Pharmacies typically print the medication instructions only on the boxes and not directly on the medication tubes.   If your medication is too expensive, please contact our office at [phone] option 4 or send us a message through MyChart.   We are unable to tell what your co-pay for medications will be in advance as this is different depending on your insurance coverage. However, we may be able to find a substitute medication at lower cost or fill out paperwork to get insurance to cover a needed medication.   If a prior authorization is required to get your medication covered by your insurance company, please allow us 1-2 business days to complete this process.  Drug prices often vary depending on where the prescription is filled and some pharmacies may offer cheaper prices.  The website www.goodrx.com contains coupons for medications through different pharmacies. The prices here do not account for what the cost may be with help from insurance (it may be cheaper with your insurance), but the website can give you the price if you did not use any insurance.  - You can print the associated coupon and take it with   your prescription to the pharmacy.  - You may also stop by our office during regular business hours and pick up a GoodRx coupon card.  - If you need your prescription sent electronically to a different pharmacy, notify our office through [facility] MyChart or by phone at [phone] option 4.     Si Usted Necesita Algo Despus de Su Visita  Tambin puede enviarnos un mensaje a travs de MyChart. Por lo general respondemos a los mensajes de MyChart en el transcurso de 1 a 2  das hbiles.  Para renovar recetas, por favor pida a su farmacia que se ponga en contacto con nuestra oficina. Nuestro nmero de fax es el [phone].  Si tiene un asunto urgente cuando la clnica est cerrada y que no puede esperar hasta el siguiente da hbil, puede llamar/localizar a su doctor(a) al nmero que aparece a continuacin.   Por favor, tenga en cuenta que aunque hacemos todo lo posible para estar disponibles para asuntos urgentes fuera del horario de oficina, no estamos disponibles las 24 horas del da, los 7 das de la semana.   Si tiene un problema urgente y no puede comunicarse con nosotros, puede optar por buscar atencin mdica  en el consultorio de su doctor(a), en una clnica privada, en un centro de atencin urgente o en una sala de emergencias.  Si tiene una emergencia mdica, por favor llame inmediatamente al 911 o vaya a la sala de emergencias.  Nmeros de bper  - Dr. Kowalski: [phone]  - Dra. Moye: [phone]  - Dra. Stewart: [phone]  En caso de inclemencias del tiempo, por favor llame a nuestra lnea principal al [phone] para una actualizacin sobre el estado de cualquier retraso o cierre.  Consejos para la medicacin en dermatologa: Por favor, guarde las cajas en las que vienen los medicamentos de uso tpico para ayudarle a seguir las instrucciones sobre dnde y cmo usarlos. Las farmacias generalmente imprimen las instrucciones del medicamento slo en las cajas y no directamente en los tubos del medicamento.   Si su medicamento es muy caro, por favor, pngase en contacto con nuestra oficina llamando al [phone] y presione la opcin 4 o envenos un mensaje a travs de MyChart.   No podemos decirle cul ser su copago por los medicamentos por adelantado ya que esto es diferente dependiendo de la cobertura de su seguro. Sin embargo, es posible que podamos encontrar un medicamento sustituto a menor costo o llenar un formulario para que el  seguro cubra el medicamento que se considera necesario.   Si se requiere una autorizacin previa para que su compaa de seguros cubra su medicamento, por favor permtanos de 1 a 2 das hbiles para completar este proceso.  Los precios de los medicamentos varan con frecuencia dependiendo del lugar de dnde se surte la receta y alguna farmacias pueden ofrecer precios ms baratos.  El sitio web www.goodrx.com tiene cupones para medicamentos de diferentes farmacias. Los precios aqu no tienen en cuenta lo que podra costar con la ayuda del seguro (puede ser ms barato con su seguro), pero el sitio web puede darle el precio si no utiliz ningn seguro.  - Puede imprimir el cupn correspondiente y llevarlo con su receta a la farmacia.  - Tambin puede pasar por nuestra oficina durante el horario de atencin regular y recoger una tarjeta de cupones de GoodRx.  - Si necesita que su receta se enve electrnicamente a una farmacia diferente, informe a nuestra oficina a travs de MyChart de [facility]   o por telfono llamando al [phone] y presione la opcin 4.  

## 2022-04-15 NOTE — Progress Notes (Signed)
   Follow-Up Visit   Subjective  Robin West is a 41 y.o. female who presents for the following: New skin lesion (L cheek x 2 weeks - pt is concerned and would like it checked). The patient has spots, moles and lesions to be evaluated, some may be new or changing and the patient has concerns that these could be cancer.  The following portions of the chart were reviewed this encounter and updated as appropriate:   Tobacco  Allergies  Meds  Problems  Med Hx  Surg Hx  Fam Hx     Review of Systems:  No other skin or systemic complaints except as noted in HPI or Assessment and Plan.  Objective  Well appearing patient in no apparent distress; mood and affect are within normal limits.  A focused examination was performed including the face. Relevant physical exam findings are noted in the Assessment and Plan.  L cheek x 1 Erythematous stuck-on, waxy papule or plaque   Assessment & Plan  Inflamed seborrheic keratosis L cheek x 1 Symptomatic, irritating, patient would like treated. Destruction of lesion - L cheek x 1 Complexity: simple   Destruction method: cryotherapy   Informed consent: discussed and consent obtained   Timeout:  patient name, date of birth, surgical site, and procedure verified Lesion destroyed using liquid nitrogen: Yes   Region frozen until ice ball extended beyond lesion: Yes   Outcome: patient tolerated procedure well with no complications   Post-procedure details: wound care instructions given    Seborrheic Keratoses - Stuck-on, waxy, tan-brown papules and/or plaques  - Benign-appearing - Discussed benign etiology and prognosis. - Observe - Call for any changes  Return for appointment as scheduled.  Luther Redo, CMA, am acting as scribe for Sarina Ser, MD . Documentation: I have reviewed the above documentation for accuracy and completeness, and I agree with the above.  Sarina Ser, MD

## 2022-04-26 ENCOUNTER — Encounter: Payer: Self-pay | Admitting: Dermatology

## 2022-05-11 ENCOUNTER — Ambulatory Visit (INDEPENDENT_AMBULATORY_CARE_PROVIDER_SITE_OTHER): Payer: Self-pay | Admitting: Dermatology

## 2022-05-11 DIAGNOSIS — L988 Other specified disorders of the skin and subcutaneous tissue: Secondary | ICD-10-CM

## 2022-05-11 NOTE — Patient Instructions (Signed)
Due to recent changes in healthcare laws, you may see results of your pathology and/or laboratory studies on MyChart before the doctors have had a chance to review them. We understand that in some cases there may be results that are confusing or concerning to you. Please understand that not all results are received at the same time and often the doctors may need to interpret multiple results in order to provide you with the best plan of care or course of treatment. Therefore, we ask that you please give us 2 business days to thoroughly review all your results before contacting the office for clarification. Should we see a critical lab result, you will be contacted sooner.   If You Need Anything After Your Visit  If you have any questions or concerns for your doctor, please call our main line at [phone] and press option 4 to reach your doctor's medical assistant. If no one answers, please leave a voicemail as directed and we will return your call as soon as possible. Messages left after 4 pm will be answered the following business day.   You may also send us a message via MyChart. We typically respond to MyChart messages within 1-2 business days.  For prescription refills, please ask your pharmacy to contact our office. Our fax number is [phone].  If you have an urgent issue when the clinic is closed that cannot wait until the next business day, you can page your doctor at the number below.    Please note that while we do our best to be available for urgent issues outside of office hours, we are not available 24/7.   If you have an urgent issue and are unable to reach us, you may choose to seek medical care at your doctor's office, retail clinic, urgent care center, or emergency room.  If you have a medical emergency, please immediately call 911 or go to the emergency department.  Pager Numbers  - Dr. Kowalski: [phone]  - Dr. Moye: [phone]  - Dr. Stewart:  [phone]  In the event of inclement weather, please call our main line at [phone] for an update on the status of any delays or closures.  Dermatology Medication Tips: Please keep the boxes that topical medications come in in order to help keep track of the instructions about where and how to use these. Pharmacies typically print the medication instructions only on the boxes and not directly on the medication tubes.   If your medication is too expensive, please contact our office at [phone] option 4 or send us a message through MyChart.   We are unable to tell what your co-pay for medications will be in advance as this is different depending on your insurance coverage. However, we may be able to find a substitute medication at lower cost or fill out paperwork to get insurance to cover a needed medication.   If a prior authorization is required to get your medication covered by your insurance company, please allow us 1-2 business days to complete this process.  Drug prices often vary depending on where the prescription is filled and some pharmacies may offer cheaper prices.  The website www.goodrx.com contains coupons for medications through different pharmacies. The prices here do not account for what the cost may be with help from insurance (it may be cheaper with your insurance), but the website can give you the price if you did not use any insurance.  - You can print the associated coupon and take it with   your prescription to the pharmacy.  - You may also stop by our office during regular business hours and pick up a GoodRx coupon card.  - If you need your prescription sent electronically to a different pharmacy, notify our office through [facility] MyChart or by phone at [phone] option 4.     Si Usted Necesita Algo Despus de Su Visita  Tambin puede enviarnos un mensaje a travs de MyChart. Por lo general respondemos a los mensajes de MyChart en el transcurso de 1 a 2  das hbiles.  Para renovar recetas, por favor pida a su farmacia que se ponga en contacto con nuestra oficina. Nuestro nmero de fax es el [phone].  Si tiene un asunto urgente cuando la clnica est cerrada y que no puede esperar hasta el siguiente da hbil, puede llamar/localizar a su doctor(a) al nmero que aparece a continuacin.   Por favor, tenga en cuenta que aunque hacemos todo lo posible para estar disponibles para asuntos urgentes fuera del horario de oficina, no estamos disponibles las 24 horas del da, los 7 das de la semana.   Si tiene un problema urgente y no puede comunicarse con nosotros, puede optar por buscar atencin mdica  en el consultorio de su doctor(a), en una clnica privada, en un centro de atencin urgente o en una sala de emergencias.  Si tiene una emergencia mdica, por favor llame inmediatamente al 911 o vaya a la sala de emergencias.  Nmeros de bper  - Dr. Kowalski: [phone]  - Dra. Moye: [phone]  - Dra. Stewart: [phone]  En caso de inclemencias del tiempo, por favor llame a nuestra lnea principal al [phone] para una actualizacin sobre el estado de cualquier retraso o cierre.  Consejos para la medicacin en dermatologa: Por favor, guarde las cajas en las que vienen los medicamentos de uso tpico para ayudarle a seguir las instrucciones sobre dnde y cmo usarlos. Las farmacias generalmente imprimen las instrucciones del medicamento slo en las cajas y no directamente en los tubos del medicamento.   Si su medicamento es muy caro, por favor, pngase en contacto con nuestra oficina llamando al [phone] y presione la opcin 4 o envenos un mensaje a travs de MyChart.   No podemos decirle cul ser su copago por los medicamentos por adelantado ya que esto es diferente dependiendo de la cobertura de su seguro. Sin embargo, es posible que podamos encontrar un medicamento sustituto a menor costo o llenar un formulario para que el  seguro cubra el medicamento que se considera necesario.   Si se requiere una autorizacin previa para que su compaa de seguros cubra su medicamento, por favor permtanos de 1 a 2 das hbiles para completar este proceso.  Los precios de los medicamentos varan con frecuencia dependiendo del lugar de dnde se surte la receta y alguna farmacias pueden ofrecer precios ms baratos.  El sitio web www.goodrx.com tiene cupones para medicamentos de diferentes farmacias. Los precios aqu no tienen en cuenta lo que podra costar con la ayuda del seguro (puede ser ms barato con su seguro), pero el sitio web puede darle el precio si no utiliz ningn seguro.  - Puede imprimir el cupn correspondiente y llevarlo con su receta a la farmacia.  - Tambin puede pasar por nuestra oficina durante el horario de atencin regular y recoger una tarjeta de cupones de GoodRx.  - Si necesita que su receta se enve electrnicamente a una farmacia diferente, informe a nuestra oficina a travs de MyChart de [facility]   o por telfono llamando al [phone] y presione la opcin 4.  

## 2022-05-11 NOTE — Progress Notes (Signed)
   Follow-Up Visit   Subjective  KEYONIA GLUTH is a 41 y.o. female who presents for the following: Facial Elastosis (Botox today).  The following portions of the chart were reviewed this encounter and updated as appropriate:   Tobacco  Allergies  Meds  Problems  Med Hx  Surg Hx  Fam Hx     Review of Systems:  No other skin or systemic complaints except as noted in HPI or Assessment and Plan.  Objective  Well appearing patient in no apparent distress; mood and affect are within normal limits.  A focused examination was performed including face. Relevant physical exam findings are noted in the Assessment and Plan.  Face Rhytides and volume loss.       Assessment & Plan  Elastosis of skin Face  Botox today - 45 units total  Frown Complex - 20 units Forehead - 15 units Crow's Feet - 5 units each side   Discussed Skintyte to underside chin. Advised her that Dr. Laurence Ferrari will be doing some laser in the near future and we will send Dr. Laurence Ferrari her name as a potential patient.  Botox Injection - Face Location: See attached image  Informed consent: Discussed risks (infection, pain, bleeding, bruising, swelling, allergic reaction, paralysis of nearby muscles, eyelid droop, double vision, neck weakness, difficulty breathing, headache, undesirable cosmetic result, and need for additional treatment) and benefits of the procedure, as well as the alternatives.  Informed consent was obtained.  Preparation: The area was cleansed with alcohol.  Procedure Details:  Botox was injected into the dermis with a 30-gauge needle. Pressure applied to any bleeding. Ice packs offered for swelling.  Lot Number:  [id number] Expiration:  08/2024  Total Units Injected:  45  Plan: Patient was instructed to remain upright for 4 hours. Patient was instructed to avoid massaging the face and avoid vigorous exercise for the rest of the day. Tylenol may be used for headache.  Allow 2 weeks before  returning to clinic for additional dosing as needed. Patient will call for any problems.    Return for Botox in 3-4 months.  I, Ashok Cordia, CMA, am acting as scribe for Sarina Ser, MD .  Documentation: I have reviewed the above documentation for accuracy and completeness, and I agree with the above.  Sarina Ser, MD

## 2022-05-21 ENCOUNTER — Encounter: Payer: Self-pay | Admitting: Dermatology

## 2022-05-31 ENCOUNTER — Encounter: Payer: Self-pay | Admitting: Internal Medicine

## 2022-06-03 ENCOUNTER — Ambulatory Visit (INDEPENDENT_AMBULATORY_CARE_PROVIDER_SITE_OTHER): Payer: 59 | Admitting: Internal Medicine

## 2022-06-03 ENCOUNTER — Encounter: Payer: Self-pay | Admitting: Internal Medicine

## 2022-06-03 VITALS — BP 114/76 | HR 85 | Temp 97.7°F | Wt 154.0 lb

## 2022-06-03 DIAGNOSIS — R350 Frequency of micturition: Secondary | ICD-10-CM | POA: Diagnosis not present

## 2022-06-03 DIAGNOSIS — R3989 Other symptoms and signs involving the genitourinary system: Secondary | ICD-10-CM

## 2022-06-03 LAB — POCT URINALYSIS DIPSTICK
Bilirubin, UA: NEGATIVE
Glucose, UA: NEGATIVE
Ketones, UA: NEGATIVE
Nitrite, UA: NEGATIVE
Protein, UA: NEGATIVE
Spec Grav, UA: 1.02 (ref 1.010–1.025)
Urobilinogen, UA: 0.2 E.U./dL
pH, UA: 6 (ref 5.0–8.0)

## 2022-06-03 MED ORDER — NITROFURANTOIN MONOHYD MACRO 100 MG PO CAPS: 100.0000 mg | ORAL_CAPSULE | Freq: Two times a day (BID) | ORAL | 0 refills | Status: DC

## 2022-06-03 NOTE — Patient Instructions (Signed)
Urinary Tract Infection, Adult A urinary tract infection (UTI) is an infection of any part of the urinary tract. The urinary tract includes: The kidneys. The ureters. The bladder. The urethra. These organs make, store, and get rid of pee (urine) in the body. What are the causes? This infection is caused by germs (bacteria) in your genital area. These germs grow and cause swelling (inflammation) of your urinary tract. What increases the risk? The following factors may make you more likely to develop this condition: Using a small, thin tube (catheter) to drain pee. Not being able to control when you pee or poop (incontinence). Being female. If you are female, these things can increase the risk: Using these methods to prevent pregnancy: A medicine that kills sperm (spermicide). A device that blocks sperm (diaphragm). Having low levels of a female hormone (estrogen). Being pregnant. You are more likely to develop this condition if: You have genes that add to your risk. You are sexually active. You take antibiotic medicines. You have trouble peeing because of: A prostate that is bigger than normal, if you are female. A blockage in the part of your body that drains pee from the bladder. A kidney stone. A nerve condition that affects your bladder. Not getting enough to drink. Not peeing often enough. You have other conditions, such as: Diabetes. A weak disease-fighting system (immune system). Sickle cell disease. Gout. Injury of the spine. What are the signs or symptoms? Symptoms of this condition include: Needing to pee right away. Peeing small amounts often. Pain or burning when peeing. Blood in the pee. Pee that smells bad or not like normal. Trouble peeing. Pee that is cloudy. Fluid coming from the vagina, if you are female. Pain in the belly or lower back. Other symptoms include: Vomiting. Not feeling hungry. Feeling mixed up (confused). This may be the first symptom in  older adults. Being tired and grouchy (irritable). A fever. Watery poop (diarrhea). How is this treated? Taking antibiotic medicine. Taking other medicines. Drinking enough water. In some cases, you may need to see a specialist. Follow these instructions at home:  Medicines Take over-the-counter and prescription medicines only as told by your doctor. If you were prescribed an antibiotic medicine, take it as told by your doctor. Do not stop taking it even if you start to feel better. General instructions Make sure you: Pee until your bladder is empty. Do not hold pee for a long time. Empty your bladder after sex. Wipe from front to back after peeing or pooping if you are a female. Use each tissue one time when you wipe. Drink enough fluid to keep your pee pale yellow. Keep all follow-up visits. Contact a doctor if: You do not get better after 1-2 days. Your symptoms go away and then come back. Get help right away if: You have very bad back pain. You have very bad pain in your lower belly. You have a fever. You have chills. You feeling like you will vomit or you vomit. Summary A urinary tract infection (UTI) is an infection of any part of the urinary tract. This condition is caused by germs in your genital area. There are many risk factors for a UTI. Treatment includes antibiotic medicines. Drink enough fluid to keep your pee pale yellow. This information is not intended to replace advice given to you by your health care provider. Make sure you discuss any questions you have with your health care provider. Document Revised: 02/01/2020 Document Reviewed: 02/01/2020 Elsevier Patient Education    2023 Elsevier Inc.  

## 2022-06-03 NOTE — Progress Notes (Signed)
Subjective:    Patient ID: Robin West, female    DOB: 05/02/1981, 41 y.o.   MRN: [id number]  HPI  Patient presents to clinic today with complaint of bladder pressure and urinary frequency. This started 1 month ago. She denies urgency, dysuria, blood in her urine, vaginal discharge, vaginal odor, vaginal irritation or abnormal vaginal bleeding. She denies fever, chills, vomiting or low back pain.  She did take a dose of Fosfomycin which did not seem to improve her symptoms.  She has seen GYN 3 weeks ago for the same, urine culture and urinalysis was negative. She drinks 1 cup of coffee per day. She does drink some water but she does not feel like she has been drinking enough.   Review of Systems     Past Medical History:  Diagnosis Date   Allergy    Anemia    Anxiety    Chicken pox    Dysplastic nevus 02/09/2017   Mid back spinal at bra line. Mild atypia, lateral and deep margins involved.   Dysplastic nevus 02/09/2017   Right proximal post. lat. thigh. Mild atypia, close to margin.   Frequent headaches    History of dysplastic nevus 01/31/2020   left mid buttocks, moderate   IBS (irritable bowel syndrome)     Current Outpatient Medications  Medication Sig Dispense Refill   busPIRone (BUSPAR) 10 MG tablet TAKE 1 TABLET(10 MG) BY MOUTH TWICE DAILY 180 tablet 0   ferrous sulfate 324 MG TBEC Take 324 mg by mouth.     Galcanezumab-gnlm (EMGALITY) 120 MG/ML SOSY Inject 120 mg into the skin every 28 (twenty-eight) days.     JUNEL FE 1/20 1-20 MG-MCG tablet Take 1 tablet by mouth daily.  3   Multiple Vitamin (MULTI-VITAMINS) TABS Take by mouth.     omeprazole (PRILOSEC) 40 MG capsule TAKE 1 CAPSULE BY MOUTH EVERY DAY 90 capsule 1   Polyethylene Glycol 3350 (MIRALAX PO) Take by mouth as needed.     Probiotic Product (PROBIOTIC PO) Take by mouth. BYE BYE Bloat- one daily     promethazine (PHENERGAN) 25 MG tablet Take 25 mg by mouth as needed.      SUMAtriptan (IMITREX) 100 MG  tablet Take 1 tablet by mouth as needed. For migraine may repeat in 2hrs if not better Max 2/24hr  2   SUMAtriptan 6 MG/0.5ML SOAJ as needed.   3   No current facility-administered medications for this visit.    No Known Allergies  Family History  Problem Relation Age of Onset   Hypertension Mother    Hyperlipidemia Father    Hypertension Father    Arthritis Maternal Grandmother    Stroke Maternal Grandfather    Ovarian cancer Paternal Grandmother    Ovarian cancer Paternal Aunt    Esophageal cancer Paternal Uncle    Healthy Half-Sister    Healthy Half-Sister    Healthy Half-Sister    Colon cancer Neg Hx    Stomach cancer Neg Hx    Rectal cancer Neg Hx     Social History   Socioeconomic History   Marital status: Married    Spouse name: Not on file   Number of children: 2   Years of education: Not on file   Highest education level: Not on file  Occupational History   Occupation: Sales Rep  Tobacco Use   Smoking status: Never   Smokeless tobacco: Never  Vaping Use   Vaping Use: Never used  Substance and Sexual Activity  Alcohol use: Yes    Alcohol/week: 0.0 standard drinks of alcohol    Comment: rare   Drug use: No   Sexual activity: Yes    Partners: Male    Birth control/protection: Pill  Other Topics Concern   Not on file  Social History Narrative   Not on file   Social Determinants of Health   Financial Resource Strain: Not on file  Food Insecurity: Not on file  Transportation Needs: Not on file  Physical Activity: Not on file  Stress: Not on file  Social Connections: Not on file  Intimate Partner Violence: Not on file     Constitutional: Denies fever, malaise, fatigue, headache or abrupt weight changes.  Respiratory: Denies difficulty breathing, shortness of breath, cough or sputum production.   Cardiovascular: Denies chest pain, chest tightness, palpitations or swelling in the hands or feet.  Gastrointestinal: Patient reports nausea.  Denies  abdominal pain, bloating, constipation, diarrhea or blood in the stool.  GU: Patient reports urinary frequency and bladder pressure.  Denies urgency, pain with urination, burning sensation, blood in urine, odor or discharge. Skin: Denies redness, rashes, lesions or ulcercations.   No other specific complaints in a complete review of systems (except as listed in HPI above).  Objective:   Physical Exam BP 114/76 (BP Location: Left Arm, Patient Position: Sitting, Cuff Size: Normal)   Pulse 85   Temp 97.7 F (36.5 C) (Temporal)   Wt 154 lb (69.9 kg)   SpO2 100%   BMI 24.86 kg/m   Wt Readings from Last 3 Encounters:  04/12/22 153 lb (69.4 kg)  09/14/21 147 lb (66.7 kg)  06/03/21 140 lb (63.5 kg)    General: Appears her stated age, well developed, well nourished in NAD. Cardiovascular: Normal rate. Pulmonary/Chest: Normal effort. Abdomen: Soft and mildly tender over the bladder.  No CVA tenderness noted. Musculoskeletal:  No difficulty with gait.  Neurological: Alert and oriented.    BMET    Component Value Date/Time   NA 138 04/12/2022 0856   K 4.2 04/12/2022 0856   CL 105 04/12/2022 0856   CO2 24 04/12/2022 0856   GLUCOSE 96 04/12/2022 0856   BUN 27 (H) 04/12/2022 0856   CREATININE 0.55 04/12/2022 0856   CALCIUM 8.9 04/12/2022 0856   GFRNONAA >60 06/03/2021 1643   GFRAA >60 02/29/2016 1902    Lipid Panel     Component Value Date/Time   CHOL 199 04/12/2022 0856   TRIG 89 04/12/2022 0856   HDL 52 04/12/2022 0856   CHOLHDL 3.8 04/12/2022 0856   VLDL 13.4 03/06/2020 0838   LDLCALC 128 (H) 04/12/2022 0856    CBC    Component Value Date/Time   WBC 7.5 04/12/2022 0856   RBC 3.80 04/12/2022 0856   HGB 11.9 04/12/2022 0856   HCT 35.2 04/12/2022 0856   PLT 334 04/12/2022 0856   MCV 92.6 04/12/2022 0856   MCH 31.3 04/12/2022 0856   MCHC 33.8 04/12/2022 0856   RDW 11.7 04/12/2022 0856   LYMPHSABS 1,333 01/20/2021 0820   MONOABS 0.6 09/19/2020 1057   EOSABS 111  01/20/2021 0820   BASOSABS 39 01/20/2021 0820    Hgb A1C Lab Results  Component Value Date   HGBA1C 5.4 03/23/2021            Assessment & Plan:   Urinary Frequency, Bladder Pressure:  Urinalusis: 2+ blood, 1+ leuks Will send urine culture RX for Macrobid 100 mg BID Push fluids  RTC in 5 months for follow  up chronic conditions Webb Silversmith, NP

## 2022-06-03 NOTE — Addendum Note (Signed)
Addended by: Ashley Royalty E on: 06/03/2022 02:25 PM   Modules accepted: Orders

## 2022-06-04 LAB — URINE CULTURE
MICRO NUMBER:: 14253757
Result:: NO GROWTH
SPECIMEN QUALITY:: ADEQUATE

## 2022-06-05 ENCOUNTER — Encounter: Payer: Self-pay | Admitting: Internal Medicine

## 2022-06-05 DIAGNOSIS — D259 Leiomyoma of uterus, unspecified: Secondary | ICD-10-CM

## 2022-06-05 DIAGNOSIS — R102 Pelvic and perineal pain: Secondary | ICD-10-CM

## 2022-06-07 ENCOUNTER — Telehealth: Payer: Self-pay

## 2022-06-07 DIAGNOSIS — K769 Liver disease, unspecified: Secondary | ICD-10-CM

## 2022-06-07 DIAGNOSIS — R932 Abnormal findings on diagnostic imaging of liver and biliary tract: Secondary | ICD-10-CM

## 2022-06-07 NOTE — Telephone Encounter (Signed)
MRI liver order in epic. Secure staff message sent to radiology scheduling to contact patient to set up appt. MyChart message sent to patient with imaging reminder.

## 2022-06-07 NOTE — Telephone Encounter (Signed)
-----   Message from Yevette Edwards, RN sent at 06/22/2021  4:03 PM EST ----- Regarding: MRI Repeat 1 year MRI liver with eovist contrast - liver lesion, abnormal imaging of the liver [facility]  Need to enter order

## 2022-06-07 NOTE — Telephone Encounter (Signed)
Patient reviewed and responded to MyChart message. Last read by Earlie Counts at  9:29 AM on 06/07/2022.

## 2022-06-14 ENCOUNTER — Encounter (HOSPITAL_COMMUNITY): Payer: Self-pay

## 2022-06-14 ENCOUNTER — Ambulatory Visit (HOSPITAL_COMMUNITY)
Admission: EM | Admit: 2022-06-14 | Discharge: 2022-06-14 | Disposition: A | Discharge status: Home / Self Care | Payer: 59 | Attending: Emergency Medicine | Admitting: Emergency Medicine

## 2022-06-14 ENCOUNTER — Telehealth: Payer: Self-pay

## 2022-06-14 DIAGNOSIS — D259 Leiomyoma of uterus, unspecified: Secondary | ICD-10-CM | POA: Diagnosis not present

## 2022-06-14 DIAGNOSIS — R102 Pelvic and perineal pain: Secondary | ICD-10-CM

## 2022-06-14 LAB — POCT URINALYSIS DIPSTICK, ED / UC
Bilirubin Urine: NEGATIVE
Glucose, UA: NEGATIVE mg/dL
Leukocytes,Ua: NEGATIVE
Nitrite: NEGATIVE
Protein, ur: NEGATIVE mg/dL
Specific Gravity, Urine: 1.02 (ref 1.005–1.030)
Urobilinogen, UA: 0.2 mg/dL (ref 0.0–1.0)
pH: 7.5 (ref 5.0–8.0)

## 2022-06-14 MED ORDER — KETOROLAC TROMETHAMINE 30 MG/ML IJ SOLN
INTRAMUSCULAR | Status: AC
  Filled 2022-06-14: qty 1

## 2022-06-14 MED ORDER — KETOROLAC TROMETHAMINE 60 MG/2ML IM SOLN
30.0000 mg | Freq: Once | INTRAMUSCULAR | Status: AC
  Administered 2022-06-14: 30 mg via INTRAMUSCULAR

## 2022-06-14 NOTE — ED Triage Notes (Signed)
Pt c/o lower abdomen pain radiating to bilateral sides and urinary difficulty for over a month. States was tx'd for UTI 2wks ago with little relief. States last night her lower abdominal pain was the worse and felt like she couldn't empty her bladder until today. States had nausea last night.

## 2022-06-14 NOTE — ED Provider Notes (Signed)
[place]    CSN: [id number] Arrival date & time: 06/14/22  1038     History   Chief Complaint Chief Complaint  Patient presents with   Abdominal Pain    Recently had UTI, took antibiotics (Macrobid - finished it last week). Pain was terrible last night and this morning - delayed urination, PCP or OB can't see me - both suggested Urgent Care. - Entered by patient    HPI Robin West is a 41 y.o. female.  Presents for lower abdominal pain x 1 month Nausea last night, no vomiting. Suprapubic pain worsened last night. 5/10 pain today No fevers. No vaginal discharge or bleeding. Uses OCPs  She was seen 11/30 by PCP with same symptoms. Had some blood and leuks in the urine, was treated with macrobid. Urinary symptoms improved from that visit but abdominal pain persists   History of IBS but reports this is different than digestive symptoms  Denies history of uterine abnormality, reports history of ovarian cyst  Past Medical History:  Diagnosis Date   Allergy    Anemia    Anxiety    Chicken pox    Dysplastic nevus 02/09/2017   Mid back spinal at bra line. Mild atypia, lateral and deep margins involved.   Dysplastic nevus 02/09/2017   Right proximal post. lat. thigh. Mild atypia, close to margin.   Frequent headaches    History of dysplastic nevus 01/31/2020   left mid buttocks, moderate   IBS (irritable bowel syndrome)     Patient Active Problem List   Diagnosis Date Noted   Pure hypercholesterolemia 03/23/2021   Prediabetes 03/23/2021   Iron deficiency anemia 03/23/2021   Anxiety 03/06/2020   GERD (gastroesophageal reflux disease) 06/05/2019   Primary insomnia 11/06/2018   Frequent headaches 10/22/2014   IBS (irritable bowel syndrome) 10/22/2014    Past Surgical History:  Procedure Laterality Date   FLEXIBLE SIGMOIDOSCOPY N/A 06/06/2020   Procedure: FLEXIBLE SIGMOIDOSCOPY;  Surgeon: Lavena Bullion, DO;  Location: [facility];  Service:  Gastroenterology;  Laterality: N/A;   GANGLION CYST EXCISION Left 01/08/2016   Procedure: EXCISION DORSAL GANGLION LEFT WRIST;  Surgeon: Daryll Brod, MD;  Location: [facility];  Service: Orthopedics;  Laterality: Left;   HEMORROIDECTOMY  2004   HEMOSTASIS CLIP PLACEMENT  06/06/2020   Procedure: HEMOSTASIS CLIP PLACEMENT;  Surgeon: Lavena Bullion, DO;  Location: [facility];  Service: Gastroenterology;;   TONSILLECTOMY  2010   WISDOM TOOTH EXTRACTION      OB History   No obstetric history on file.      Home Medications    Prior to Admission medications   Medication Sig Start Date End Date Taking? Authorizing Provider  busPIRone (BUSPAR) 10 MG tablet TAKE 1 TABLET(10 MG) BY MOUTH TWICE DAILY 03/29/22   Jearld Fenton, NP  ferrous sulfate 324 MG TBEC Take 324 mg by mouth.    [provider]  Galcanezumab-gnlm (EMGALITY) 120 MG/ML SOSY Inject 120 mg into the skin every 28 (twenty-eight) days.    [provider]  JUNEL FE 1/20 1-20 MG-MCG tablet Take 1 tablet by mouth daily. 05/19/17   [provider]  Multiple Vitamin (MULTI-VITAMINS) TABS Take by mouth.    [provider]  omeprazole (PRILOSEC) 40 MG capsule TAKE 1 CAPSULE BY MOUTH EVERY DAY 09/22/21   Jearld Fenton, NP  Polyethylene Glycol 3350 (MIRALAX PO) Take by mouth as needed.    [provider]  Probiotic Product (PROBIOTIC PO) Take  by mouth. BYE BYE Bloat- one daily    [provider]  promethazine (PHENERGAN) 25 MG tablet Take 25 mg by mouth as needed.  11/20/15   [provider]  SUMAtriptan (IMITREX) 100 MG tablet Take 1 tablet by mouth as needed. For migraine may repeat in 2hrs if not better Max 2/24hr 09/18/14   [provider]  SUMAtriptan 6 MG/0.5ML SOAJ as needed.  11/21/15   [provider]    Family History Family History  Problem Relation Age of Onset   Hypertension Mother    Hyperlipidemia Father    Hypertension Father     Arthritis Maternal Grandmother    Stroke Maternal Grandfather    Ovarian cancer Paternal Grandmother    Ovarian cancer Paternal Aunt    Esophageal cancer Paternal Uncle    Healthy Half-Sister    Healthy Half-Sister    Healthy Half-Sister    Colon cancer Neg Hx    Stomach cancer Neg Hx    Rectal cancer Neg Hx     Social History Social History   Tobacco Use   Smoking status: Never   Smokeless tobacco: Never  Vaping Use   Vaping Use: Never used  Substance Use Topics   Alcohol use: Yes    Alcohol/week: 0.0 standard drinks of alcohol    Comment: rare   Drug use: No     Allergies   Patient has no known allergies.   Review of Systems Review of Systems  Gastrointestinal:  Positive for abdominal pain.   Per HPI  Physical Exam Triage Vital Signs ED Triage Vitals  Enc Vitals Group     BP 06/14/22 1412 125/82     Pulse Rate 06/14/22 1412 (!) 102     Resp 06/14/22 1412 18     Temp 06/14/22 1412 99.2 F (37.3 C)     Temp Source 06/14/22 1412 Oral     SpO2 --      Weight --      Height --      Head Circumference --      Peak Flow --      Pain Score 06/14/22 1413 5     Pain Loc --      Pain Edu? --      Excl. in [place]? --    No data found.  Updated Vital Signs BP 125/82 (BP Location: Right Arm)   Pulse (!) 102   Temp 99.2 F (37.3 C) (Oral)   Resp 18   Physical Exam Vitals and nursing note reviewed.  Constitutional:      Appearance: Normal appearance.  HENT:     Mouth/Throat:     Mouth: Mucous membranes are moist.     Pharynx: Oropharynx is clear.  Eyes:     Conjunctiva/sclera: Conjunctivae normal.  Cardiovascular:     Rate and Rhythm: Normal rate and regular rhythm.     Heart sounds: Normal heart sounds.  Pulmonary:     Effort: Pulmonary effort is normal. No respiratory distress.     Breath sounds: Normal breath sounds.  Abdominal:     General: Abdomen is flat. Bowel sounds are normal.     Palpations: Abdomen is soft.     Tenderness: There is  abdominal tenderness in the suprapubic area. There is no right CVA tenderness, left CVA tenderness, guarding or rebound. Negative signs include McBurney's sign.  Musculoskeletal:        General: Normal range of motion.  Skin:    General: Skin is warm  and dry.  Neurological:     Mental Status: She is alert and oriented to person, place, and time.     UC Treatments / Results  Labs (all labs ordered are listed, but only abnormal results are displayed) Labs Reviewed  POCT URINALYSIS DIPSTICK, ED / UC - Abnormal; Notable for the following components:      Result Value   Ketones, ur TRACE (*)    Hgb urine dipstick TRACE (*)    All other components within normal limits    EKG  Radiology No results found.  Procedures Procedures   Medications Ordered in UC Medications  ketorolac (TORADOL) injection 30 mg (30 mg Intramuscular Given 06/14/22 1453)    Initial Impression / Assessment and Plan / UC Course  I have reviewed the triage vital signs and the nursing notes.  Pertinent labs & imaging results that were available during my care of the patient were reviewed by me and considered in my medical decision making (see chart for details).  On chart review she had little blood and leuks in urine sample on 11/30, culture did not grow anything. She finished course of macrobid.  Today trace hgb in urine. I do not suspect urinary tract infection. Other abdominal etiology considered at this time including nephrolithiasis or ob/gyn cause. She does not have unilateral pain associated with kidney stone and pain seems to be localized over the suprapubic area. I suspect an Ob/gyn cause  Toradol injection given today for pain. Patient reports symptoms improving.   Recommend using ibuprofen or tylenol for pain, monitoring symptoms, and going to the emergency department if anything worsens. Recommend contact ob/gyn.  Patient agrees to plan   At end of visit, patient reports ultrasound performed  by ob/gyn in June showed large fibroid and signs of adenomyosis. Discussed with patient this is likely the cause of her abdominal pain, may have grown or spread since then, and I recommend gyn follow up. Provided clinic in area for follow up or she can see her usual gyn.   Final Clinical Impressions(s) / UC Diagnoses   Final diagnoses:  Suprapubic pain  Uterine leiomyoma, unspecified location     Discharge Instructions      I suspect some sort of ob/gyn cause, maybe related to your fibroid or adenomyosis   I recommend using tylenol or ibuprofen for pain control for the next few days. Increase fluid intake as well.  Please call your ob/gyn to schedule appointment for further evaluation.  Please go to the emergency department if at any point symptoms worsen.     ED Prescriptions   None    PDMP not reviewed this encounter.   Jaeanna Mccomber, [place], [place] 06/14/22 [phone]

## 2022-06-14 NOTE — Discharge Instructions (Addendum)
I suspect some sort of ob/gyn cause, maybe related to your fibroid or adenomyosis   I recommend using tylenol or ibuprofen for pain control for the next few days. Increase fluid intake as well.  Please call your ob/gyn to schedule appointment for further evaluation.  Please go to the emergency department if at any point symptoms worsen.

## 2022-06-14 NOTE — Telephone Encounter (Signed)
Noted  

## 2022-06-14 NOTE — Telephone Encounter (Signed)
-----   Message from [place], [place] sent at 06/14/2022 11:48 AM EST ----- Regarding: RE: MRI liver appt at [facility]..  .06/14/22- pt stated that she would contact the office back at a later date to have her MRI appt sched.Ellison Hughs    ----- Message ----- From: Belva Chimes H Sent: 06/11/2022   5:00 PM EST To: Maryann Conners, NT Subject: FW: MRI liver appt at [facility]                      ----- Message ----- From: Yevette Edwards, RN Sent: 06/07/2022   9:29 AM EST To: April H Pait; Roosvelt Maser Subject: MRI liver appt at [facility]                         Please contact patient to schedule MRI liver at [facility]. The order is in epic. Thanks

## 2022-06-15 ENCOUNTER — Inpatient Hospital Stay: Admission: RE | Admit: 2022-06-15 | Payer: 59 | Source: Ambulatory Visit

## 2022-06-18 ENCOUNTER — Ambulatory Visit
Admission: RE | Admit: 2022-06-18 | Discharge: 2022-06-18 | Disposition: A | Discharge status: Home / Self Care | Payer: 59 | Source: Ambulatory Visit | Attending: Internal Medicine | Admitting: Internal Medicine

## 2022-06-18 DIAGNOSIS — D259 Leiomyoma of uterus, unspecified: Secondary | ICD-10-CM

## 2022-06-18 DIAGNOSIS — R102 Pelvic and perineal pain: Secondary | ICD-10-CM | POA: Diagnosis present

## 2022-06-22 ENCOUNTER — Other Ambulatory Visit: Payer: Self-pay | Admitting: Internal Medicine

## 2022-06-22 NOTE — Telephone Encounter (Signed)
Requested Prescriptions  Pending Prescriptions Disp Refills   omeprazole (PRILOSEC) 40 MG capsule [Pharmacy Med Name: OMEPRAZOLE '40MG'$  CAPSULES] 90 capsule 1    Sig: TAKE 1 CAPSULE BY MOUTH EVERY DAY     Gastroenterology: Proton Pump Inhibitors Passed - 06/22/2022  3:31 AM      Passed - Valid encounter within last 12 months    Recent Outpatient Visits           2 weeks ago Urinary frequency   [facility] [place], Coralie Keens, NP   2 months ago Encounter for general adult medical examination with abnormal findings   [facility] [place], Coralie Keens, NP   9 months ago Acute right-sided low back pain with right-sided sciatica   [facility] [place], Coralie Keens, NP   1 year ago Encounter for general adult medical examination with abnormal findings   [facility] [place], Coralie Keens, NP       Future Appointments             In 3 months Baity, Coralie Keens, NP [facility], [place]   In 8 months Ralene Bathe, MD [place]

## 2022-06-23 ENCOUNTER — Ambulatory Visit: Payer: 59 | Admitting: Obstetrics & Gynecology

## 2022-06-23 ENCOUNTER — Encounter: Payer: Self-pay | Admitting: Obstetrics & Gynecology

## 2022-06-23 VITALS — BP 110/80 | HR 82 | Ht 65.0 in | Wt 152.0 lb

## 2022-06-23 DIAGNOSIS — D219 Benign neoplasm of connective and other soft tissue, unspecified: Secondary | ICD-10-CM

## 2022-06-23 DIAGNOSIS — R102 Pelvic and perineal pain: Secondary | ICD-10-CM

## 2022-06-23 DIAGNOSIS — Z3041 Encounter for surveillance of contraceptive pills: Secondary | ICD-10-CM

## 2022-06-23 NOTE — Progress Notes (Signed)
    Robin West 1980-08-11 [id number]        41 y.o.  G2P2L2 Married  RP: Pelvic cramping pain worsening x 04/2022  HPI: Treated for an acute cystitis 2 weeks ago.  The UTI Sxs went away, but the cramping pelvic pain remained.  A repeat U/A was negative for WBC and Bacteria on 06/14/22.  On continuous BCPs for cyclic migraines.  No BTB.  Per previous Editor, commissioning, probable Adenomyosis/Fibroids a year ago.  Pelvic US done at [facility] on 06/18/22 showed 2 probably SM Fibroids.  No sexually active because of the pain currently, but had some discomfort with deep penetration in October and November.  Currently, the pain is less severe and patient is taking Acetaminophen/Ibuprofen to control it.  Declines STI screen.  No fever.   OB History  Gravida Para Term Preterm AB Living  2       0 2  SAB IAB Ectopic Multiple Live Births  0   0        # Outcome Date GA Lbr Len/2nd Weight Sex Delivery Anes PTL Lv  2 Gravida           1 Gravida             Past medical history,surgical history, problem list, medications, allergies, family history and social history were all reviewed and documented in the EPIC chart.   Directed ROS with pertinent positives and negatives documented in the history of present illness/assessment and plan.  Exam:  Vitals:   06/23/22 1023  BP: 110/80  Pulse: 82  Weight: 152 lb (68.9 kg)  Height: '5\' 5"'$  (1.651 m)   General appearance:  Normal  Abdomen: Normal, soft, NT, not distended, no mass.  Gynecologic exam: Vulva normal.  Bimanual exam:  Uterus AV, wnl for volume, mobile, mildly tender.  Cervix feels normal.  No adnexal mass, NT.  No blood, no discharge.   Assessment/Plan:  41 y.o. G2P0002   1. Pelvic pain in female  Treated for an acute cystitis 2 weeks ago.  The UTI Sxs went away, but the cramping pelvic pain remained.  A repeat U/A was negative for WBC and Bacteria on 06/14/22.  On continuous BCPs for cyclic migraines.  No BTB.  Per previous Editor, commissioning,  probable Adenomyosis/Fibroids a year ago.  Pelvic US done at [facility] on 06/18/22 showed 2 probably SM Fibroids.  No sexually active because of the pain currently, but had some discomfort with deep penetration in October and November.  Currently, the pain is less severe and patient is taking Acetaminophen/Ibuprofen to control it.  Declines STI screen.  No fever.  Gyn exam wnl, but mildly tender uterus.  Will f/u with a Sonohysto to further investigate.  Continue on continuous BCPs and Acetaminophen/Ibuprofen PRN.  Precautions discussed. - [name] Sonohysterogram; Future  2. Fibroids Will evaluate for SM Fibroids with Sonohysto.  Will also verify for the possibility of Adenomyosis. - [name] Sonohysterogram; Future  3. Encounter for surveillance of contraceptive pills  Continue with BCPs continuously.  Princess Bruins MD, 11:14 AM 06/23/2022

## 2022-06-30 ENCOUNTER — Ambulatory Visit: Payer: 59 | Admitting: Family Medicine

## 2022-06-30 ENCOUNTER — Ambulatory Visit: Payer: 59 | Admitting: Internal Medicine

## 2022-06-30 ENCOUNTER — Encounter: Payer: Self-pay | Admitting: Internal Medicine

## 2022-06-30 ENCOUNTER — Ambulatory Visit: Payer: Self-pay | Admitting: *Deleted

## 2022-06-30 VITALS — BP 119/76 | HR 118 | Temp 100.5°F | Resp 18 | Ht 65.0 in | Wt 154.8 lb

## 2022-06-30 DIAGNOSIS — R52 Pain, unspecified: Secondary | ICD-10-CM | POA: Diagnosis not present

## 2022-06-30 DIAGNOSIS — J101 Influenza due to other identified influenza virus with other respiratory manifestations: Secondary | ICD-10-CM

## 2022-06-30 LAB — POCT INFLUENZA A/B
Influenza A, POC: POSITIVE — AB
Influenza B, POC: NEGATIVE

## 2022-06-30 LAB — POC COVID19 BINAXNOW: SARS Coronavirus 2 Ag: NEGATIVE

## 2022-06-30 MED ORDER — PROMETHAZINE-DM 6.25-15 MG/5ML PO SYRP: 5.0000 mL | ORAL_SOLUTION | Freq: Four times a day (QID) | ORAL | 0 refills | Status: DC | PRN

## 2022-06-30 MED ORDER — OSELTAMIVIR PHOSPHATE 75 MG PO CAPS: 75.0000 mg | ORAL_CAPSULE | Freq: Two times a day (BID) | ORAL | 0 refills | Status: DC

## 2022-06-30 NOTE — Telephone Encounter (Signed)
Reason for Disposition  Lots of coughing  Answer Assessment - Initial Assessment Questions 1. LOCATION: "Where does it hurt?"      Started Christmas Eve scratchy throat.    I'm having chills, coughing up yellow-white.    My chest hurts from coughing.   2. ONSET: "When did the sinus pain start?"  (e.g., hours, days)      Christmas Eve   3. SEVERITY: "How bad is the pain?"   (Scale 1-10; mild, moderate or severe)   - MILD (1-3): doesn't interfere with normal activities    - MODERATE (4-7): interferes with normal activities (e.g., work or school) or awakens from sleep   - SEVERE (8-10): excruciating pain and patient unable to do any normal activities        Moderate  4. RECURRENT SYMPTOM: "Have you ever had sinus problems before?" If Yes, ask: "When was the last time?" and "What happened that time?"      Not asked 5. NASAL CONGESTION: "Is the nose blocked?" If Yes, ask: "Can you open it or must you breathe through your mouth?"     Yes 6. NASAL DISCHARGE: "Do you have discharge from your nose?" If so ask, "What color?"     Yellow mucus 7. FEVER: "Do you have a fever?" If Yes, ask: "What is it, how was it measured, and when did it start?"      No but having chills and some body aches 8. OTHER SYMPTOMS: "Do you have any other symptoms?" (e.g., sore throat, cough, earache, difficulty breathing)     Scratchy throat, cough, chills, chest pain from coughing so much 9. PREGNANCY: "Is there any chance you are pregnant?" "When was your last menstrual period?"     Not asked  Protocols used: Sinus Pain or Congestion-A-AH

## 2022-06-30 NOTE — Patient Instructions (Signed)

## 2022-06-30 NOTE — Telephone Encounter (Signed)
  Chief Complaint: scratchy throat, nasal congestion, chills, chest pain from coughing so much, body aches and chills. Symptoms:  See above Frequency: Started Christmas Eve Pertinent Negatives: Patient denies fever Disposition: '[]'$ ED /'[]'$ Urgent Care (no appt availability in office) / '[x]'$ Appointment(In office/virtual)/ '[]'$  [facility]/ '[]'$ Home Care/ '[]'$ Refused Recommended Disposition /'[]'$ [facility]/ '[]'$  Follow-up with PCP Additional Notes: Appt made for today at 1:40.

## 2022-06-30 NOTE — Progress Notes (Signed)
Subjective:    Patient ID: Robin West, female    DOB: 1981-01-20, 41 y.o.   MRN: [id number]  HPI  Pt presents to the clinic today with c/o headache,  nasal congestion,sore throat, cough, nausea, vomiting and body aches. This started 2 days ago. The headache is located in the top of her head. She describes the pain as pressure. She is blowing yellow mucous out of her nose. She denies difficulty swallowing. The cough is productive of yellow mucous. She denies runny nose, ear pain, shortness of breath or diarrhea. She has tried Mucinex D, Elderberry, Tylenol and Ibuprofen OTC without any relief of symptoms. She has not had sick contacts that she is aware of.   Review of Systems  Past Medical History:  Diagnosis Date   Allergy    Anemia    Anxiety    Chicken pox    Dysplastic nevus 02/09/2017   Mid back spinal at bra line. Mild atypia, lateral and deep margins involved.   Dysplastic nevus 02/09/2017   Right proximal post. lat. thigh. Mild atypia, close to margin.   Frequent headaches    History of dysplastic nevus 01/31/2020   left mid buttocks, moderate   IBS (irritable bowel syndrome)     Current Outpatient Medications  Medication Sig Dispense Refill   busPIRone (BUSPAR) 10 MG tablet TAKE 1 TABLET(10 MG) BY MOUTH TWICE DAILY 180 tablet 0   ferrous sulfate 324 MG TBEC Take 324 mg by mouth.     Galcanezumab-gnlm (EMGALITY) 120 MG/ML SOSY Inject 120 mg into the skin every 28 (twenty-eight) days.     JUNEL FE 1/20 1-20 MG-MCG tablet Take 1 tablet by mouth daily.  3   Multiple Vitamin (MULTI-VITAMINS) TABS Take by mouth.     omeprazole (PRILOSEC) 40 MG capsule TAKE 1 CAPSULE BY MOUTH EVERY DAY 90 capsule 1   Probiotic Product (PROBIOTIC PO) Take by mouth. BYE BYE Bloat- one daily     promethazine (PHENERGAN) 25 MG tablet Take 25 mg by mouth as needed.      SUMAtriptan (IMITREX) 100 MG tablet Take 1 tablet by mouth as needed. For migraine may repeat in 2hrs if not better Max  2/24hr  2   SUMAtriptan 6 MG/0.5ML SOAJ as needed.   3   No current facility-administered medications for this visit.    No Known Allergies  Family History  Problem Relation Age of Onset   Hypertension Mother    Hyperlipidemia Father    Hypertension Father    Arthritis Maternal Grandmother    Stroke Maternal Grandfather    Ovarian cancer Paternal Grandmother    Ovarian cancer Paternal Aunt    Esophageal cancer Paternal Uncle    Healthy Half-Sister    Healthy Half-Sister    Healthy Half-Sister    Colon cancer Neg Hx    Stomach cancer Neg Hx    Rectal cancer Neg Hx     Social History   Socioeconomic History   Marital status: Married    Spouse name: Not on file   Number of children: 2   Years of education: Not on file   Highest education level: Not on file  Occupational History   Occupation: Press photographer Rep  Tobacco Use   Smoking status: Never   Smokeless tobacco: Never  Vaping Use   Vaping Use: Never used  Substance and Sexual Activity   Alcohol use: Yes    Alcohol/week: 0.0 standard drinks of alcohol    Comment: rare   Drug  use: No   Sexual activity: Yes    Partners: Male    Birth control/protection: Pill  Other Topics Concern   Not on file  Social History Narrative   Not on file   Social Determinants of Health   Financial Resource Strain: Not on file  Food Insecurity: Not on file  Transportation Needs: Not on file  Physical Activity: Not on file  Stress: Not on file  Social Connections: Not on file  Intimate Partner Violence: Not on file     Constitutional: Denies fever, malaise, fatigue, headache or abrupt weight changes.  HEENT: Pt reports nasal congestion and sore throat. Denies eye pain, eye redness, ear pain, ringing in the ears, wax buildup, runny nose, bloody nose. Respiratory: Pt reports cough. Denies difficulty breathing, shortness of breath, or sputum production.   Cardiovascular: Denies chest pain, chest tightness, palpitations or swelling in  the hands or feet.  Gastrointestinal: Pt reports nausea and vomiting. Denies abdominal pain, bloating, constipation, diarrhea or blood in the stool.  Musculoskeletal: Pt reports body aches. Denies decrease in range of motion, difficulty with gait, or joint pain and swelling.  Skin: Denies redness, rashes, lesions or ulcercations.  Neurological: Denies dizziness, difficulty with memory, difficulty with speech or problems with balance and coordination.  No other specific complaints in a complete review of systems (except as listed in HPI above).     Objective:   Physical Exam BP 119/76 (BP Location: Right Arm, Patient Position: Sitting, Cuff Size: Normal)   Pulse (!) 118   Temp (!) 100.5 F (38.1 C) (Oral)   Resp 18   Ht '5\' 5"'$  (1.651 m)   Wt 154 lb 12.8 oz (70.2 kg)   LMP  (LMP Unknown) Comment: takes birth control, cont  SpO2 99%   BMI 25.76 kg/m   Wt Readings from Last 3 Encounters:  06/23/22 152 lb (68.9 kg)  06/03/22 154 lb (69.9 kg)  04/12/22 153 lb (69.4 kg)    General: Appears her stated age, appears unwell but in NAD. Skin: Warm, dry and intact. No rashes noted. HEENT: Head: normal shape and size; Eyes: sclera white, no icterus, conjunctiva pink, PERRLA and EOMs intact; Throat/Mouth: Teeth present, mucosa pink and moist, +PND, no exudate, lesions or ulcerations noted.  Neck:  No adenopathy noted. Cardiovascular: Tachycardic with normal rhythm. S1,S2 noted.  No murmur, rubs or gallops noted.  Pulmonary/Chest: Normal effort and positive vesicular breath sounds. No respiratory distress. No wheezes, rales or ronchi noted.  Abdomen: Normal bowel sounds.  Musculoskeletal:  No difficulty with gait.  Neurological: Alert and oriented.   BMET    Component Value Date/Time   NA 138 04/12/2022 0856   K 4.2 04/12/2022 0856   CL 105 04/12/2022 0856   CO2 24 04/12/2022 0856   GLUCOSE 96 04/12/2022 0856   BUN 27 (H) 04/12/2022 0856   CREATININE 0.55 04/12/2022 0856   CALCIUM 8.9  04/12/2022 0856   GFRNONAA >60 06/03/2021 1643   GFRAA >60 02/29/2016 1902    Lipid Panel     Component Value Date/Time   CHOL 199 04/12/2022 0856   TRIG 89 04/12/2022 0856   HDL 52 04/12/2022 0856   CHOLHDL 3.8 04/12/2022 0856   VLDL 13.4 03/06/2020 0838   LDLCALC 128 (H) 04/12/2022 0856    CBC    Component Value Date/Time   WBC 7.5 04/12/2022 0856   RBC 3.80 04/12/2022 0856   HGB 11.9 04/12/2022 0856   HCT 35.2 04/12/2022 0856   PLT 334  04/12/2022 0856   MCV 92.6 04/12/2022 0856   MCH 31.3 04/12/2022 0856   MCHC 33.8 04/12/2022 0856   RDW 11.7 04/12/2022 0856   LYMPHSABS 1,333 01/20/2021 0820   MONOABS 0.6 09/19/2020 1057   EOSABS 111 01/20/2021 0820   BASOSABS 39 01/20/2021 0820    Hgb A1C Lab Results  Component Value Date   HGBA1C 5.4 03/23/2021             Assessment & Plan:   Fever, Body Aches, Influenza A:  Encourage rest and fluid Can take Ibuprofen or Tylenol OTC as needed for fever body aches Rx for Promethazine DM for nausea and cough-sedation caution given RX for Tamiflu 75 MG BID x 5 days  RTC in 4 months, follow up chronic conditions Webb Silversmith, NP

## 2022-07-07 ENCOUNTER — Encounter: Payer: Self-pay | Admitting: Internal Medicine

## 2022-07-07 MED ORDER — BENZONATATE 200 MG PO CAPS: 200.0000 mg | ORAL_CAPSULE | Freq: Two times a day (BID) | ORAL | 0 refills | Status: DC | PRN

## 2022-07-09 MED ORDER — PREDNISONE 10 MG PO TABS: ORAL_TABLET | ORAL | 0 refills | Status: DC

## 2022-07-09 NOTE — Addendum Note (Signed)
Addended by: Jearld Fenton on: 07/09/2022 09:00 AM   Modules accepted: Orders

## 2022-07-29 ENCOUNTER — Ambulatory Visit (INDEPENDENT_AMBULATORY_CARE_PROVIDER_SITE_OTHER): Payer: 59 | Admitting: Obstetrics & Gynecology

## 2022-07-29 ENCOUNTER — Other Ambulatory Visit: Payer: Self-pay | Admitting: Obstetrics & Gynecology

## 2022-07-29 ENCOUNTER — Encounter: Payer: Self-pay | Admitting: Obstetrics & Gynecology

## 2022-07-29 ENCOUNTER — Ambulatory Visit (INDEPENDENT_AMBULATORY_CARE_PROVIDER_SITE_OTHER): Payer: 59

## 2022-07-29 VITALS — BP 100/80 | HR 83

## 2022-07-29 DIAGNOSIS — D219 Benign neoplasm of connective and other soft tissue, unspecified: Secondary | ICD-10-CM

## 2022-07-29 DIAGNOSIS — R102 Pelvic and perineal pain: Secondary | ICD-10-CM

## 2022-07-29 DIAGNOSIS — Z3041 Encounter for surveillance of contraceptive pills: Secondary | ICD-10-CM

## 2022-07-29 DIAGNOSIS — K582 Mixed irritable bowel syndrome: Secondary | ICD-10-CM

## 2022-07-29 NOTE — Progress Notes (Signed)
    Robin West 1980/10/19 [id number]        42 y.o.  G2P0002   RP: Pelvic pain/Fibroids for Sonohysto  HPI: On continuous BCPs for cyclic migraines. No BTB. Pelvic US done at [facility] on 06/18/22. Currently, the pain is less severe and patient is taking Acetaminophen and Ibuprofen to control it.  IBS with mainly constipation.  Recent episode of Left pelvic pain at night, no vaginal bleeding at that time.    OB History  Gravida Para Term Preterm AB Living  2       0 2  SAB IAB Ectopic Multiple Live Births  0   0        # Outcome Date GA Lbr Len/2nd Weight Sex Delivery Anes PTL Lv  2 Gravida           1 Gravida             Past medical history,surgical history, problem list, medications, allergies, family history and social history were all reviewed and documented in the EPIC chart.   Directed ROS with pertinent positives and negatives documented in the history of present illness/assessment and plan.  Exam:  There were no vitals filed for this visit. General appearance:  Normal  Pelvic US today: T/V images.  Anteverted uterus normal in size and shape with 3 small intramural fibroids measured at 2.07 cm, 0.7 cm and 1.24 cm.  The overall uterine size is measured at 7.96 x 5.07 x 4.59 cm.  The endometrial lining is thin and symmetrical measured at 3.45 mm with no mass or thickening or abnormal blood flow seen.  Both ovaries are small with sparse follicles and normal perfusion.  No adnexal mass.  No free fluid in the pelvis.     Assessment/Plan:  42 y.o. G2P0002   1. Pelvic pain in female On continuous BCPs for cyclic migraines. No BTB. Pelvic US done at [facility] on 06/18/22. Currently, the pain is less severe and patient is taking Acetaminophen/Ibuprofen to control it.  IBS with mainly constipation.  Recent episode of Left pelvic pain at night, no vaginal bleeding at that time.  Pelvic US reviewed thoroughly with patient.  Small IM fibroids, no SM fibroid and no evidence of significant  adenomyosis.  Thin normal endometrium line.  Normal bilateral ovaries.  Patient reassured.  Left pelvic pain is more likely d/t IBS.  2. Irritable bowel syndrome with both constipation and diarrhea Left intermittent pelvic pain.  Tendency for constipation.  Counseling on dietary measures to combat constipation done.  3. Fibroids 3 small IM Fibroids.  Reassured.  4. Encounter for surveillance of contraceptive pills  Will continue on continuous BCPs.  Schedule Annual Gyn exam.  Princess Bruins MD, 8:20 AM 07/29/2022

## 2022-08-10 ENCOUNTER — Ambulatory Visit: Payer: 59 | Admitting: Dermatology

## 2022-08-16 ENCOUNTER — Encounter: Payer: Self-pay | Admitting: Internal Medicine

## 2022-08-24 ENCOUNTER — Other Ambulatory Visit: Payer: Self-pay | Admitting: Internal Medicine

## 2022-08-25 NOTE — Telephone Encounter (Signed)
Requested Prescriptions  Pending Prescriptions Disp Refills   busPIRone (BUSPAR) 10 MG tablet [Pharmacy Med Name: BUSPIRONE 10MG TABLETS] 180 tablet 0    Sig: TAKE 1 TABLET(10 MG) BY MOUTH TWICE DAILY     Psychiatry: Anxiolytics/Hypnotics - Non-controlled Passed - 08/24/2022  6:37 AM      Passed - Valid encounter within last 12 months    Recent Outpatient Visits           1 month ago Body aches   [facility] [place], Coralie Keens, NP   2 months ago Urinary frequency   [facility] [place], Coralie Keens, NP   4 months ago Encounter for general adult medical examination with abnormal findings   [facility] [place], Coralie Keens, NP   11 months ago Acute right-sided low back pain with right-sided sciatica   [facility] [place], Coralie Keens, NP   1 year ago Encounter for general adult medical examination with abnormal findings   [facility] [place], Coralie Keens, NP       Future Appointments             In 1 month Baity, Coralie Keens, NP [facility] [place]   In 6 months Ralene Bathe, MD [place]

## 2022-10-05 ENCOUNTER — Ambulatory Visit: Payer: 59 | Admitting: Dermatology

## 2022-10-12 ENCOUNTER — Encounter: Payer: Self-pay | Admitting: Internal Medicine

## 2022-10-12 ENCOUNTER — Ambulatory Visit: Payer: 59 | Admitting: Internal Medicine

## 2022-10-12 VITALS — BP 104/62 | HR 86 | Temp 96.9°F | Wt 156.0 lb

## 2022-10-12 DIAGNOSIS — K581 Irritable bowel syndrome with constipation: Secondary | ICD-10-CM

## 2022-10-12 DIAGNOSIS — Z6825 Body mass index (BMI) 25.0-25.9, adult: Secondary | ICD-10-CM

## 2022-10-12 DIAGNOSIS — F5101 Primary insomnia: Secondary | ICD-10-CM

## 2022-10-12 DIAGNOSIS — K219 Gastro-esophageal reflux disease without esophagitis: Secondary | ICD-10-CM

## 2022-10-12 DIAGNOSIS — E7800 Pure hypercholesterolemia, unspecified: Secondary | ICD-10-CM | POA: Diagnosis not present

## 2022-10-12 DIAGNOSIS — D500 Iron deficiency anemia secondary to blood loss (chronic): Secondary | ICD-10-CM

## 2022-10-12 DIAGNOSIS — E663 Overweight: Secondary | ICD-10-CM | POA: Insufficient documentation

## 2022-10-12 DIAGNOSIS — R519 Headache, unspecified: Secondary | ICD-10-CM

## 2022-10-12 DIAGNOSIS — F419 Anxiety disorder, unspecified: Secondary | ICD-10-CM

## 2022-10-12 DIAGNOSIS — R7303 Prediabetes: Secondary | ICD-10-CM | POA: Diagnosis not present

## 2022-10-12 NOTE — Assessment & Plan Note (Signed)
Encouraged high-fiber diet and adequate water intake Okay to continue MiraLAX

## 2022-10-12 NOTE — Assessment & Plan Note (Signed)
Continue omeprazole 

## 2022-10-12 NOTE — Assessment & Plan Note (Signed)
Okay to continue magnesium OTC

## 2022-10-12 NOTE — Assessment & Plan Note (Signed)
Continue OCPs, Emgality, Imitrex and Phenergan She will continue to follow with neurology

## 2022-10-12 NOTE — Assessment & Plan Note (Signed)
CBC today Continue oral iron 

## 2022-10-12 NOTE — Assessment & Plan Note (Signed)
C-Met and lipid profile today Encouraged her to consume low-fat diet 

## 2022-10-12 NOTE — Assessment & Plan Note (Signed)
Encourage diet and exercise for weight loss 

## 2022-10-12 NOTE — Patient Instructions (Signed)

## 2022-10-12 NOTE — Assessment & Plan Note (Signed)
A1c today Encourage low-carb diet and exercise for weight loss 

## 2022-10-12 NOTE — Progress Notes (Signed)
Subjective:    Patient ID: Robin West, female    DOB: 1981-03-29, 42 y.o.   MRN: [id number]  HPI  Patient presents to clinic today for 53-month follow-up of chronic conditions.  IBS: She reports mainly constipation.  She manages this with diet and Mirliaz.  Colonoscopy from 04/2020 reviewed.  She follows with GI.  Frequent Headaches: These occur around her menstrual cycle.  She is taking OCPs, Emgality, Imitrex and Phenergan as prescribed.  She follows with neurology.  Insomnia: She is not currently having any issues falling or staying asleep.  She is taking Magnesium OTC with good results.  There is no sleep study on file.  Anxiety: Chronic, managed on Buspirone.  She is seeing a therapist.  She denies depression, SI/HI.  GERD: She is not sure what triggers this.  She denies breakthrough on Omeprazole.  Upper GI from 04/2020 reviewed.  HLD: Her last LDL was 128, triglycerides 89, 04/2022.  She is not taking any cholesterol-lowering medication at this time.  She tries to consume a low-fat diet.  Prediabetes: Her last A1c was 5.4%, 03/2019.  She is not taking any oral diabetic medication at this time.  She does not check her sugars.  Iron Deficiency Anemia: Her last H/H was 11.7/34.6, 06/2022.  She is taking oral Iron as prescribed.  She does not follow with hematology.  Review of Systems     Past Medical History:  Diagnosis Date   Allergy    Anemia    Anxiety    Chicken pox    Dysplastic nevus 02/09/2017   Mid back spinal at bra line. Mild atypia, lateral and deep margins involved.   Dysplastic nevus 02/09/2017   Right proximal post. lat. thigh. Mild atypia, close to margin.   Frequent headaches    History of dysplastic nevus 01/31/2020   left mid buttocks, moderate   IBS (irritable bowel syndrome)     Current Outpatient Medications  Medication Sig Dispense Refill   acetaminophen (TYLENOL) 500 MG tablet Take 1,000 mg by mouth every 6 (six) hours as needed.      busPIRone (BUSPAR) 10 MG tablet TAKE 1 TABLET(10 MG) BY MOUTH TWICE DAILY 180 tablet 0   ferrous sulfate 324 MG TBEC Take 324 mg by mouth.     Galcanezumab-gnlm (EMGALITY) 120 MG/ML SOSY Inject 120 mg into the skin every 28 (twenty-eight) days.     ibuprofen (ADVIL) 200 MG tablet Take 200 mg by mouth every 6 (six) hours as needed.     JUNEL FE 1/20 1-20 MG-MCG tablet Take 1 tablet by mouth daily.  3   Multiple Vitamin (MULTI-VITAMINS) TABS Take by mouth.     omeprazole (PRILOSEC) 40 MG capsule TAKE 1 CAPSULE BY MOUTH EVERY DAY 90 capsule 1   Probiotic Product (PROBIOTIC PO) Take by mouth. BYE BYE Bloat- one daily     promethazine (PHENERGAN) 25 MG tablet Take 25 mg by mouth as needed.      SUMAtriptan (IMITREX) 100 MG tablet Take 1 tablet by mouth as needed. For migraine may repeat in 2hrs if not better Max 2/24hr  2   SUMAtriptan 6 MG/0.5ML SOAJ as needed.   3   No current facility-administered medications for this visit.    No Known Allergies  Family History  Problem Relation Age of Onset   Hypertension Mother    Hyperlipidemia Father    Hypertension Father    Arthritis Maternal Grandmother    Stroke Maternal Grandfather    Ovarian cancer  Paternal Grandmother    Ovarian cancer Paternal Aunt    Esophageal cancer Paternal Uncle    Healthy Half-Sister    Healthy Half-Sister    Healthy Half-Sister    Colon cancer Neg Hx    Stomach cancer Neg Hx    Rectal cancer Neg Hx     Social History   Socioeconomic History   Marital status: Married    Spouse name: Not on file   Number of children: 2   Years of education: Not on file   Highest education level: Not on file  Occupational History   Occupation: Airline pilotales Rep  Tobacco Use   Smoking status: Never   Smokeless tobacco: Never  Vaping Use   Vaping Use: Never used  Substance and Sexual Activity   Alcohol use: Not Currently   Drug use: No   Sexual activity: Yes    Partners: Male    Birth control/protection: OCP  Other Topics  Concern   Not on file  Social History Narrative   Not on file   Social Determinants of Health   Financial Resource Strain: Not on file  Food Insecurity: Not on file  Transportation Needs: Not on file  Physical Activity: Not on file  Stress: Not on file  Social Connections: Not on file  Intimate Partner Violence: Not on file     Constitutional: Patient reports intermittent headaches.  Denies fever, malaise, fatigue, or abrupt weight changes.  HEENT: Denies eye pain, eye redness, ear pain, ringing in the ears, wax buildup, runny nose, nasal congestion, bloody nose, or sore throat. Respiratory: Denies difficulty breathing, shortness of breath, cough or sputum production.   Cardiovascular: Denies chest pain, chest tightness, palpitations or swelling in the hands or feet.  Gastrointestinal: Denies abdominal pain, bloating, constipation, diarrhea or blood in the stool.  GU: Denies urgency, frequency, pain with urination, burning sensation, blood in urine, odor or discharge. Musculoskeletal: Denies decrease in range of motion, difficulty with gait, muscle pain or joint pain and swelling.  Skin: Denies redness, rashes, lesions or ulcercations.  Neurological: Patient has a history of insomnia.  Denies dizziness, difficulty with memory, difficulty with speech or problems with balance and coordination.  Psych: Patient has a history of anxiety.  Denies depression, SI/HI.  No other specific complaints in a complete review of systems (except as listed in HPI above).  Objective:   Physical Exam   BP 104/62 (BP Location: Right Arm, Patient Position: Sitting, Cuff Size: Normal)   Pulse 86   Temp (!) 96.9 F (36.1 C) (Temporal)   Wt 156 lb (70.8 kg)   SpO2 100%   BMI 25.96 kg/m   Wt Readings from Last 3 Encounters:  06/30/22 154 lb 12.8 oz (70.2 kg)  06/23/22 152 lb (68.9 kg)  06/03/22 154 lb (69.9 kg)    General: Appears her stated age, overweight, in NAD. Skin: Warm, dry and intact.   Cardiovascular: Normal rate and rhythm. S1,S2 noted.  No murmur, rubs or gallops noted. No JVD or BLE edema.  Pulmonary/Chest: Normal effort and positive vesicular breath sounds. No respiratory distress. No wheezes, rales or ronchi noted.  Abdomen: Normal bowel sounds.  Musculoskeletal: No difficulty with gait.  Neurological: Alert and oriented. Coordination normal.  Psychiatric: Mood and affect normal. Behavior is normal. Judgment and thought content normal.     BMET    Component Value Date/Time   NA 138 04/12/2022 0856   K 4.2 04/12/2022 0856   CL 105 04/12/2022 0856   CO2 24  04/12/2022 0856   GLUCOSE 96 04/12/2022 0856   BUN 27 (H) 04/12/2022 0856   CREATININE 0.55 04/12/2022 0856   CALCIUM 8.9 04/12/2022 0856   GFRNONAA >60 06/03/2021 1643   GFRAA >60 02/29/2016 1902    Lipid Panel     Component Value Date/Time   CHOL 199 04/12/2022 0856   TRIG 89 04/12/2022 0856   HDL 52 04/12/2022 0856   CHOLHDL 3.8 04/12/2022 0856   VLDL 13.4 03/06/2020 0838   LDLCALC 128 (H) 04/12/2022 0856    CBC    Component Value Date/Time   WBC 7.5 04/12/2022 0856   RBC 3.80 04/12/2022 0856   HGB 11.9 04/12/2022 0856   HCT 35.2 04/12/2022 0856   PLT 334 04/12/2022 0856   MCV 92.6 04/12/2022 0856   MCH 31.3 04/12/2022 0856   MCHC 33.8 04/12/2022 0856   RDW 11.7 04/12/2022 0856   LYMPHSABS 1,333 01/20/2021 0820   MONOABS 0.6 09/19/2020 1057   EOSABS 111 01/20/2021 0820   BASOSABS 39 01/20/2021 0820    Hgb A1C Lab Results  Component Value Date   HGBA1C 5.4 03/23/2021           Assessment & Plan:     RTC in 6 months for annual exam Nicki Reaper, NP

## 2022-10-12 NOTE — Assessment & Plan Note (Signed)
Stable on current dose of buspirone She will continue to meet with her therapist Support offered

## 2022-10-13 LAB — COMPLETE METABOLIC PANEL WITH GFR
AG Ratio: 1.6 (calc) (ref 1.0–2.5)
ALT: 19 U/L (ref 6–29)
AST: 16 U/L (ref 10–30)
Albumin: 3.9 g/dL (ref 3.6–5.1)
Alkaline phosphatase (APISO): 42 U/L (ref 31–125)
BUN/Creatinine Ratio: 42 (calc) — ABNORMAL HIGH (ref 6–22)
BUN: 28 mg/dL — ABNORMAL HIGH (ref 7–25)
CO2: 23 mmol/L (ref 20–32)
Calcium: 9 mg/dL (ref 8.6–10.2)
Chloride: 107 mmol/L (ref 98–110)
Creat: 0.67 mg/dL (ref 0.50–0.99)
Globulin: 2.4 g/dL (calc) (ref 1.9–3.7)
Glucose, Bld: 94 mg/dL (ref 65–139)
Potassium: 4.5 mmol/L (ref 3.5–5.3)
Sodium: 139 mmol/L (ref 135–146)
Total Bilirubin: 0.2 mg/dL (ref 0.2–1.2)
Total Protein: 6.3 g/dL (ref 6.1–8.1)
eGFR: 111 mL/min/{1.73_m2} (ref >=60)

## 2022-10-13 LAB — LIPID PANEL
Cholesterol: 188 mg/dL (ref <200)
HDL: 48 mg/dL — ABNORMAL LOW (ref >=50)
LDL Cholesterol (Calc): 121 mg/dL (calc) — ABNORMAL HIGH
Non-HDL Cholesterol (Calc): 140 mg/dL (calc) — ABNORMAL HIGH (ref <130)
Total CHOL/HDL Ratio: 3.9 (calc) (ref <5.0)
Triglycerides: 87 mg/dL (ref <150)

## 2022-10-13 LAB — CBC
HCT: 34.4 % — ABNORMAL LOW (ref 35.0–45.0)
Hemoglobin: 11.5 g/dL — ABNORMAL LOW (ref 11.7–15.5)
MCH: 30.7 pg (ref 27.0–33.0)
MCHC: 33.4 g/dL (ref 32.0–36.0)
MCV: 92 fL (ref 80.0–100.0)
MPV: 10.6 fL (ref 7.5–12.5)
Platelets: 334 10*3/uL (ref 140–400)
RBC: 3.74 10*6/uL — ABNORMAL LOW (ref 3.80–5.10)
RDW: 11.9 % (ref 11.0–15.0)
WBC: 7.2 10*3/uL (ref 3.8–10.8)

## 2022-10-13 LAB — HEMOGLOBIN A1C
Hgb A1c MFr Bld: 5.7 % of total Hgb — ABNORMAL HIGH (ref <5.7)
Mean Plasma Glucose: 117 mg/dL
eAG (mmol/L): 6.5 mmol/L

## 2022-10-15 ENCOUNTER — Encounter: Payer: Self-pay | Admitting: Internal Medicine

## 2022-12-22 ENCOUNTER — Other Ambulatory Visit: Payer: Self-pay | Admitting: Internal Medicine

## 2022-12-22 NOTE — Telephone Encounter (Signed)
Requested Prescriptions  Pending Prescriptions Disp Refills   omeprazole (PRILOSEC) 40 MG capsule [Pharmacy Med Name: OMEPRAZOLE 40MG  CAPSULES] 90 capsule 1    Sig: TAKE 1 CAPSULE BY MOUTH EVERY DAY     Gastroenterology: Proton Pump Inhibitors Passed - 12/22/2022  3:39 AM      Passed - Valid encounter within last 12 months    Recent Outpatient Visits           2 months ago Prediabetes   [place] [facility] [place], Salvadore Oxford, NP   5 months ago Body aches   [place] [facility] [place], Salvadore Oxford, NP   6 months ago Urinary frequency   [place] [facility] [place], Salvadore Oxford, NP   8 months ago Encounter for general adult medical examination with abnormal findings   [place] [facility] [place], Salvadore Oxford, NP   1 year ago Acute right-sided low back pain with right-sided sciatica   [place] [facility] [place], Salvadore Oxford, NP       Future Appointments             In 2 months Deirdre Evener, MD [facility]   In 3 months [place], Salvadore Oxford, NP  [facility], [facility]

## 2023-01-25 ENCOUNTER — Other Ambulatory Visit: Payer: Self-pay | Admitting: Internal Medicine

## 2023-01-26 NOTE — Telephone Encounter (Signed)
Requested Prescriptions  Pending Prescriptions Disp Refills   busPIRone (BUSPAR) 10 MG tablet [Pharmacy Med Name: BUSPIRONE 10MG  TABLETS] 180 tablet 0    Sig: TAKE 1 TABLET(10 MG) BY MOUTH TWICE DAILY     Psychiatry: Anxiolytics/Hypnotics - Non-controlled Passed - 01/25/2023 12:35 PM      Passed - Valid encounter within last 12 months    Recent Outpatient Visits           3 months ago Prediabetes   [place] [facility] [place], Salvadore Oxford, NP   7 months ago Body aches   [place] [facility] [place], Salvadore Oxford, NP   7 months ago Urinary frequency   [place] [facility] [place], Salvadore Oxford, NP   9 months ago Encounter for general adult medical examination with abnormal findings   [place] [facility] [place], Salvadore Oxford, NP   1 year ago Acute right-sided low back pain with right-sided sciatica   [place] [facility] [place], Salvadore Oxford, NP       Future Appointments             In 1 month Deirdre Evener, MD [facility]   In 2 months [place], Salvadore Oxford, NP  [facility]

## 2023-02-23 ENCOUNTER — Encounter: Payer: Self-pay | Admitting: Internal Medicine

## 2023-02-25 ENCOUNTER — Ambulatory Visit: Payer: 59 | Admitting: Internal Medicine

## 2023-03-08 ENCOUNTER — Other Ambulatory Visit: Payer: Self-pay | Admitting: Internal Medicine

## 2023-03-08 DIAGNOSIS — Z1231 Encounter for screening mammogram for malignant neoplasm of breast: Secondary | ICD-10-CM

## 2023-03-10 ENCOUNTER — Ambulatory Visit: Payer: 59 | Admitting: Dermatology

## 2023-03-10 ENCOUNTER — Encounter: Payer: Self-pay | Admitting: Dermatology

## 2023-03-10 ENCOUNTER — Ambulatory Visit (INDEPENDENT_AMBULATORY_CARE_PROVIDER_SITE_OTHER): Payer: 59 | Admitting: Dermatology

## 2023-03-10 VITALS — BP 110/73 | HR 83

## 2023-03-10 DIAGNOSIS — L814 Other melanin hyperpigmentation: Secondary | ICD-10-CM

## 2023-03-10 DIAGNOSIS — D229 Melanocytic nevi, unspecified: Secondary | ICD-10-CM

## 2023-03-10 DIAGNOSIS — W908XXA Exposure to other nonionizing radiation, initial encounter: Secondary | ICD-10-CM

## 2023-03-10 DIAGNOSIS — Z86018 Personal history of other benign neoplasm: Secondary | ICD-10-CM

## 2023-03-10 DIAGNOSIS — D1801 Hemangioma of skin and subcutaneous tissue: Secondary | ICD-10-CM

## 2023-03-10 DIAGNOSIS — Z1283 Encounter for screening for malignant neoplasm of skin: Secondary | ICD-10-CM

## 2023-03-10 DIAGNOSIS — L821 Other seborrheic keratosis: Secondary | ICD-10-CM

## 2023-03-10 DIAGNOSIS — L578 Other skin changes due to chronic exposure to nonionizing radiation: Secondary | ICD-10-CM

## 2023-03-10 NOTE — Progress Notes (Signed)
   Follow-Up Visit   Subjective  Robin West is a 42 y.o. female who presents for the following: Skin Cancer Screening and Full Body Skin Exam  The patient presents for Total-Body Skin Exam (TBSE) for skin cancer screening and mole check. The patient has spots, moles and lesions to be evaluated, some may be new or changing and the patient may have concern these could be cancer.   The following portions of the chart were reviewed this encounter and updated as appropriate: medications, allergies, medical history  Review of Systems:  No other skin or systemic complaints except as noted in HPI or Assessment and Plan.  Objective  Well appearing patient in no apparent distress; mood and affect are within normal limits.  A full examination was performed including scalp, head, eyes, ears, nose, lips, neck, chest, axillae, abdomen, back, buttocks, bilateral upper extremities, bilateral lower extremities, hands, feet, fingers, toes, fingernails, and toenails. All findings within normal limits unless otherwise noted below.   Relevant physical exam findings are noted in the Assessment and Plan.    Assessment & Plan   SKIN CANCER SCREENING PERFORMED TODAY.  ACTINIC DAMAGE - Chronic condition, secondary to cumulative UV/sun exposure - diffuse scaly erythematous macules with underlying dyspigmentation - Recommend daily broad spectrum sunscreen SPF 30+ to sun-exposed areas, reapply every 2 hours as needed.  - Staying in the shade or wearing long sleeves, sun glasses (UVA+UVB protection) and wide brim hats (4-inch brim around the entire circumference of the hat) are also recommended for sun protection.  - Call for new or changing lesions.  LENTIGINES, SEBORRHEIC KERATOSES, HEMANGIOMAS - Benign normal skin lesions - Benign-appearing - Call for any changes  MELANOCYTIC NEVI - Tan-brown and/or pink-flesh-colored symmetric macules and papules - Benign appearing on exam today - Observation -  Call clinic for new or changing moles - Recommend daily use of broad spectrum spf 30+ sunscreen to sun-exposed areas.   HISTORY OF DYSPLASTIC NEVUS No evidence of recurrence today Recommend regular full body skin exams Recommend daily broad spectrum sunscreen SPF 30+ to sun-exposed areas, reapply every 2 hours as needed.  Call if any new or changing lesions are noted between office visits  Return in about 1 year (around 03/09/2024) for TBSE.  Maylene Roes, CMA, am acting as scribe for Armida Sans, MD .   Documentation: I have reviewed the above documentation for accuracy and completeness, and I agree with the above.  Armida Sans, MD

## 2023-03-10 NOTE — Patient Instructions (Addendum)
Mole A mole is a colored (pigmented) growth on the skin. Moles are very common. They are usually harmless, but some moles can become cancerous over time. What are the causes? Moles are caused when pigmented skin cells grow together in clusters instead of spreading out in the skin as they normally do. The reason why the skin cells grow together in clusters is not known. What increases the risk? You are more likely to develop a mole if: You have family members who have moles. You are fair skinned. You have red or blond hair. You are often outdoors and exposed to the sun. You received phototherapy when you were a newborn baby. You are female. What are the signs or symptoms? A mole may occur anywhere on your skin. A mole may be: [place] or another color. Although moles are most often brown, they can also be tan, black, red, pink, blue, skin-toned, or colorless. Flat or raised. Smooth or wrinkled. Round in shape. How is this diagnosed? A mole is diagnosed with a skin exam. If your health care provider thinks a mole may be cancerous, all or part of the mole will be removed for testing (biopsy). How is this treated? Most moles are noncancerous (benign) and do not require treatment. If a mole is found to be cancerous, it will be removed. You may also choose to have a mole removed if it is causing pain or if you do not like the way it looks. Follow these instructions at home: General instructions  Every month, look for new moles and check your existing moles for changes. This is important because a change in a mole can mean that the mole has become cancerous. ABCDE changes in a mole indicate that you should be evaluated by your health care provider. ABCDE stands for: Asymmetry. This means the mole has an irregular shape. It is not round or oval. Border. This means the mole has an irregular or bumpy border. Color. This means the mole has multiple colors in it, including brown, black, blue, red, or  tan. Note that it is normal for moles to get darker when a woman is pregnant or takes birth control pills. Diameter. This means the mole is more than 0.2 inches (6 mm) across. Evolving. This refers to any unusual changes or symptoms in the mole, such as pain, itching, stinging, sensitivity, or bleeding. If you have a large number of moles, see a skin doctor (dermatologist) at least one time every year for a full-body skin check. Lifestyle  When you are outdoors, wear sunscreen with SPF 30 (sun protection factor 30) or higher. Use an adequate amount of sunscreen to cover exposed areas of skin. Put it on 30 minutes before you go out. Reapply it every 2 hours or anytime you come out of the water. When you are out in the sun, wear a broad-brimmed hat and clothing that covers your arms and legs. Wear wraparound sunglasses. Contact a health care provider if: The size, shape, borders, or color of your mole changes. Your mole, or the skin near the mole, becomes painful, sore, red, or swollen. Your mole: Develops more than one color. Itches or bleeds. Becomes scaly, sheds skin, or oozes fluid. Becomes flat or develops raised areas. Becomes hard or soft. You develop a new mole. Summary A mole is a colored (pigmented) growth on the skin. Moles are very common. They are usually harmless, but some moles can become cancerous over time. Every month, look for new moles and check your  existing moles for changes. This is important because a change in a mole can mean that the mole has become cancerous. If you have a large number of moles, see a skin doctor (dermatologist) at least one time every year for a full-body skin check. When you are outdoors, wear sunscreen with SPF 30 (sun protection factor 30) or higher. Reapply it every 2 hours or anytime you come out of the water. Contact a health care provider if you notice changes in a mole or if you develop a new mole. This information is not intended to replace  advice given to you by your health care provider. Make sure you discuss any questions you have with your health care provider. Document Revised: 03/12/2021 Document Reviewed: 03/12/2021 Elsevier Patient Education  2024 [employer].     Due to recent changes in healthcare laws, you may see results of your pathology and/or laboratory studies on MyChart before the doctors have had a chance to review them. We understand that in some cases there may be results that are confusing or concerning to you. Please understand that not all results are received at the same time and often the doctors may need to interpret multiple results in order to provide you with the best plan of care or course of treatment. Therefore, we ask that you please give [name] 2 business days to thoroughly review all your results before contacting the office for clarification. Should we see a critical lab result, you will be contacted sooner.   If You Need Anything After Your Visit  If you have any questions or concerns for your doctor, please call our main line at [phone] and press option 4 to reach your doctor's medical assistant. If no one answers, please leave a voicemail as directed and we will return your call as soon as possible. Messages left after 4 pm will be answered the following business day.   You may also send [name] a message via MyChart. We typically respond to MyChart messages within 1-2 business days.  For prescription refills, please ask your pharmacy to contact our office. Our fax number is [phone].  If you have an urgent issue when the clinic is closed that cannot wait until the next business day, you can page your doctor at the number below.    Please note that while we do our best to be available for urgent issues outside of office hours, we are not available 24/7.   If you have an urgent issue and are unable to reach [name], you may choose to seek medical care at your doctor's office, retail clinic,  urgent care center, or emergency room.  If you have a medical emergency, please immediately call 911 or go to the emergency department.  Pager Numbers  - Dr. Gwen Pounds: [phone]  - Dr. Roseanne Reno: [phone]  - Dr. Katrinka Blazing: [phone]   In the event of inclement weather, please call our main line at [phone] for an update on the status of any delays or closures.  Dermatology Medication Tips: Please keep the boxes that topical medications come in in order to help keep track of the instructions about where and how to use these. Pharmacies typically print the medication instructions only on the boxes and not directly on the medication tubes.   If your medication is too expensive, please contact our office at [phone] option 4 or send [name] a message through MyChart.   We are unable to tell what your co-pay for medications will be in advance as  this is different depending on your insurance coverage. However, we may be able to find a substitute medication at lower cost or fill out paperwork to get insurance to cover a needed medication.   If a prior authorization is required to get your medication covered by your insurance company, please allow [name] 1-2 business days to complete this process.  Drug prices often vary depending on where the prescription is filled and some pharmacies may offer cheaper prices.  The website www.goodrx.com contains coupons for medications through different pharmacies. The prices here do not account for what the cost may be with help from insurance (it may be cheaper with your insurance), but the website can give you the price if you did not use any insurance.  - You can print the associated coupon and take it with your prescription to the pharmacy.  - You may also stop by our office during regular business hours and pick up a GoodRx coupon card.  - If you need your prescription sent electronically to a different pharmacy, notify our office through [facility] or by phone at [phone] option 4.     Si Usted Necesita Algo Despus de Su Visita  Tambin puede enviarnos un mensaje a travs de Clinical cytogeneticist. Por lo general respondemos a los mensajes de MyChart en el transcurso de 1 a 2 das hbiles.  Para renovar recetas, por favor pida a su farmacia que se ponga en contacto con nuestra oficina. Annie Sable de fax es [place] [phone].  Si tiene un asunto urgente cuando la clnica est cerrada y que no puede esperar hasta el siguiente da hbil, puede llamar/localizar a su doctor(a) al nmero que aparece a continuacin.   Por favor, tenga en cuenta que aunque hacemos todo lo posible para estar disponibles para asuntos urgentes fuera del horario de [place], no estamos disponibles las 24 horas del da, los 7 [address]  [address] de la [place].   Si tiene un problema urgente y no puede comunicarse con nosotros, puede optar por buscar atencin mdica  en el consultorio de su doctor(a), en una clnica privada, en un centro de atencin urgente o en una sala de emergencias.  Si tiene Engineer, drilling, por favor llame inmediatamente al 911 o vaya a la sala de emergencias.  Nmeros de bper  - Dr. Gwen Pounds: [phone]  - Dra. Roseanne Reno: [phone]  - Dr. Katrinka Blazing: [phone]   En caso de inclemencias del tiempo, por favor llame a Lacy Duverney principal al [phone] para una actualizacin sobre el [place] de cualquier retraso o cierre.  Consejos para la medicacin en dermatologa: Por favor, guarde las cajas en las que vienen los medicamentos de uso tpico para ayudarle a seguir las instrucciones sobre dnde y cmo usarlos. Las farmacias generalmente imprimen las instrucciones del medicamento slo en las cajas y no directamente en los tubos del [place].   Si su medicamento es muy caro, por favor, pngase en contacto con Rolm Gala llamando al [phone] y presione la opcin 4 o envenos un mensaje a travs de Clinical cytogeneticist.   No podemos decirle cul  ser su copago por los medicamentos por adelantado ya que esto es diferente dependiendo de la cobertura de su seguro. Sin embargo, es posible que podamos encontrar un medicamento sustituto a Audiological scientist un formulario para que el seguro cubra el medicamento que se considera necesario.   Si se requiere una autorizacin previa para que su compaa de seguros [place] su medicamento, por favor permtanos de 1 a 2 [address]  [address]  hbiles para completar este proceso.  Los precios de los medicamentos varan con frecuencia dependiendo del Environmental consultant de dnde se surte la receta y alguna farmacias pueden ofrecer precios ms baratos.  El sitio web www.goodrx.com tiene cupones para medicamentos de Health and safety inspector. Los precios aqu no tienen en cuenta lo que podra costar con la ayuda del seguro (puede ser ms barato con su seguro), pero el sitio web puede darle el precio si no utiliz Tourist information centre manager.  - Puede imprimir el cupn correspondiente y llevarlo con su receta a la farmacia.  - Tambin puede pasar por nuestra oficina durante el horario de atencin regular y Education officer, museum una tarjeta de cupones de GoodRx.  - Si necesita que su receta se enve electrnicamente a una farmacia diferente, informe a nuestra oficina a travs de MyChart de [place] o por telfono llamando al [phone] y presione la opcin 4.

## 2023-03-21 ENCOUNTER — Other Ambulatory Visit: Payer: Self-pay | Admitting: Internal Medicine

## 2023-03-22 NOTE — Telephone Encounter (Signed)
Requested Prescriptions  Pending Prescriptions Disp Refills   omeprazole (PRILOSEC) 40 MG capsule [Pharmacy Med Name: OMEPRAZOLE 40MG  CAPSULES] 90 capsule 0    Sig: TAKE 1 CAPSULE BY MOUTH EVERY DAY     Gastroenterology: Proton Pump Inhibitors Passed - 03/21/2023  3:42 AM      Passed - Valid encounter within last 12 months    Recent Outpatient Visits           5 months ago Prediabetes   [facility] [place], Salvadore Oxford, NP   8 months ago Body aches   [place] [facility] [place], Salvadore Oxford, NP   9 months ago Urinary frequency   [place] [facility] [place], Salvadore Oxford, NP   11 months ago Encounter for general adult medical examination with abnormal findings   [place] [facility] [place], [place], NP   1 year ago Acute right-sided low back pain with right-sided sciatica   [place] [facility] [place], Salvadore Oxford, NP       Future Appointments             In 3 weeks [place], Salv[place]ore Oxford, NP [place] [facility] [place]   In 1 year Deirdre Evener, MD [facility] [place]

## 2023-04-07 ENCOUNTER — Ambulatory Visit
Admission: RE | Admit: 2023-04-07 | Discharge: 2023-04-07 | Disposition: A | Discharge status: Home / Self Care | Payer: 59 | Source: Ambulatory Visit

## 2023-04-07 DIAGNOSIS — Z1231 Encounter for screening mammogram for malignant neoplasm of breast: Secondary | ICD-10-CM

## 2023-04-14 ENCOUNTER — Encounter: Payer: 59 | Admitting: Internal Medicine

## 2023-04-27 ENCOUNTER — Ambulatory Visit (INDEPENDENT_AMBULATORY_CARE_PROVIDER_SITE_OTHER): Payer: 59 | Admitting: Internal Medicine

## 2023-04-27 ENCOUNTER — Encounter: Payer: Self-pay | Admitting: Internal Medicine

## 2023-04-27 VITALS — BP 112/70 | HR 78 | Ht 65.0 in | Wt 156.0 lb

## 2023-04-27 DIAGNOSIS — R7303 Prediabetes: Secondary | ICD-10-CM | POA: Diagnosis not present

## 2023-04-27 DIAGNOSIS — E663 Overweight: Secondary | ICD-10-CM

## 2023-04-27 DIAGNOSIS — E7800 Pure hypercholesterolemia, unspecified: Secondary | ICD-10-CM

## 2023-04-27 DIAGNOSIS — Z6825 Body mass index (BMI) 25.0-25.9, adult: Secondary | ICD-10-CM

## 2023-04-27 DIAGNOSIS — Z0001 Encounter for general adult medical examination with abnormal findings: Secondary | ICD-10-CM

## 2023-04-27 NOTE — Progress Notes (Signed)
Subjective:    Patient ID: Robin West, female    DOB: 1981/06/18, 42 y.o.   MRN: [id number]  HPI  Patient presents to clinic today for her annual exam.  Flu: 02/2019 Tetanus: 11/2012 Covid: Never Pap smear: 03/2021 Mammogram: 04/2023 Vision screening: annually Dentist: biannually  Diet: She does eat meat. She consumes fruits and veggies. She does eat some fried food. She drinks mostly coffee and water. Exercise: Walking  Review of Systems     Past Medical History:  Diagnosis Date   Allergy    Anemia    Anxiety    Chicken pox    Dysplastic nevus 02/09/2017   Mid back spinal at bra line. Mild atypia, lateral and deep margins involved.   Dysplastic nevus 02/09/2017   Right proximal post. lat. thigh. Mild atypia, close to margin.   Frequent headaches    History of dysplastic nevus 01/31/2020   left mid buttocks, moderate   IBS (irritable bowel syndrome)     Current Outpatient Medications  Medication Sig Dispense Refill   acetaminophen (TYLENOL) 500 MG tablet Take 1,000 mg by mouth every 6 (six) hours as needed.     busPIRone (BUSPAR) 10 MG tablet TAKE 1 TABLET(10 MG) BY MOUTH TWICE DAILY 180 tablet 0   ferrous sulfate 324 MG TBEC Take 324 mg by mouth.     Galcanezumab-gnlm (EMGALITY) 120 MG/ML SOSY Inject 120 mg into the skin every 28 (twenty-eight) days.     ibuprofen (ADVIL) 200 MG tablet Take 200 mg by mouth every 6 (six) hours as needed.     JUNEL FE 1/20 1-20 MG-MCG tablet Take 1 tablet by mouth daily.  3   Multiple Vitamin (MULTI-VITAMINS) TABS Take by mouth.     omeprazole (PRILOSEC) 40 MG capsule TAKE 1 CAPSULE BY MOUTH EVERY DAY 90 capsule 0   Probiotic Product (PROBIOTIC PO) Take by mouth. BYE BYE Bloat- one daily     promethazine (PHENERGAN) 25 MG tablet Take 25 mg by mouth as needed.      SUMAtriptan (IMITREX) 100 MG tablet Take 1 tablet by mouth as needed. For migraine may repeat in 2hrs if not better Max 2/24hr  2   SUMAtriptan 6 MG/0.5ML SOAJ as  needed.   3   No current facility-administered medications for this visit.    No Known Allergies  Family History  Problem Relation Age of Onset   Hypertension Mother    Hyperlipidemia Father    Hypertension Father    Arthritis Maternal Grandmother    Stroke Maternal Grandfather    Ovarian cancer Paternal Grandmother    Ovarian cancer Paternal Aunt    Esophageal cancer Paternal Uncle    Healthy Half-Sister    Healthy Half-Sister    Healthy Half-Sister    Colon cancer Neg Hx    Stomach cancer Neg Hx    Rectal cancer Neg Hx     Social History   Socioeconomic History   Marital status: Married    Spouse name: Not on file   Number of children: 2   Years of education: Not on file   Highest education level: Not on file  Occupational History   Occupation: Airline pilot Rep  Tobacco Use   Smoking status: Never   Smokeless tobacco: Never  Vaping Use   Vaping status: Never Used  Substance and Sexual Activity   Alcohol use: Not Currently   Drug use: No   Sexual activity: Yes    Partners: Male    Birth control/protection:  OCP  Other Topics Concern   Not on file  Social History Narrative   Not on file   Social Determinants of Health   Financial Resource Strain: Not on file  Food Insecurity: Not on file  Transportation Needs: Not on file  Physical Activity: Not on file  Stress: Not on file  Social Connections: Not on file  Intimate Partner Violence: Not At Risk (05/11/2022)   Received [facility], [facility]   Humiliation, Afraid, Rape, and Kick questionnaire    Fear of Current or Ex-Partner: No    Emotionally Abused: No    Physically Abused: No    Sexually Abused: No     Constitutional: Patient reports intermittent headaches.  Denies fever, malaise, fatigue, or abrupt weight changes.  HEENT: Denies eye pain, eye redness, ear pain, ringing in the ears, wax buildup, runny nose, nasal congestion, bloody nose, or sore throat. Respiratory: Denies difficulty  breathing, shortness of breath, cough or sputum production.   Cardiovascular: Denies chest pain, chest tightness, palpitations or swelling in the hands or feet.  Gastrointestinal: Patient reports intermittent constipation.  Denies abdominal pain, bloating, diarrhea or blood in the stool.  GU: Denies urgency, frequency, pain with urination, burning sensation, blood in urine, odor or discharge. Musculoskeletal: Denies decrease in range of motion, difficulty with gait, muscle pain or joint pain and swelling.  Skin: Denies redness, rashes, lesions or ulcercations.  Neurological: Patient reports insomnia.  Denies dizziness, difficulty with memory, difficulty with speech or problems with balance and coordination.  Psych: Patient has a history of anxiety.  Denies depression, SI/HI.  No other specific complaints in a complete review of systems (except as listed in HPI above).  Objective:   Physical Exam  BP 112/70   Pulse 78   Ht 5\' 5"  (1.651 m)   Wt 156 lb (70.8 kg)   SpO2 99%   BMI 25.96 kg/m   Wt Readings from Last 3 Encounters:  10/12/22 156 lb (70.8 kg)  06/30/22 154 lb 12.8 oz (70.2 kg)  06/23/22 152 lb (68.9 kg)    General: Appears her stated age, overweight, in NAD. Skin: Warm, dry and intact.  HEENT: Head: normal shape and size; Eyes: sclera white, no icterus, conjunctiva pink, PERRLA and EOMs intact;  Neck:  Neck supple, trachea midline. No masses, lumps or thyromegaly present.  Cardiovascular: Normal rate and rhythm. S1,S2 noted.  No murmur, rubs or gallops noted. No JVD or BLE edema.  Pulmonary/Chest: Normal effort and positive vesicular breath sounds. No respiratory distress. No wheezes, rales or ronchi noted.  Abdomen: Soft and nontender. Normal bowel sounds.  Musculoskeletal: Strength 5/5 BUE/BLE. No difficulty with gait.  Neurological: Alert and oriented. Cranial nerves II-XII grossly intact. Coordination normal.  Psychiatric: Mood and affect normal. Behavior is normal.  Judgment and thought content normal.   BMET    Component Value Date/Time   NA 139 10/12/2022 0831   K 4.5 10/12/2022 0831   CL 107 10/12/2022 0831   CO2 23 10/12/2022 0831   GLUCOSE 94 10/12/2022 0831   BUN 28 (H) 10/12/2022 0831   CREATININE 0.67 10/12/2022 0831   CALCIUM 9.0 10/12/2022 0831   GFRNONAA >60 06/03/2021 1643   GFRAA >60 02/29/2016 1902    Lipid Panel     Component Value Date/Time   CHOL 188 10/12/2022 0831   TRIG 87 10/12/2022 0831   HDL 48 (L) 10/12/2022 0831   CHOLHDL 3.9 10/12/2022 0831   VLDL 13.4 03/06/2020 5409  LDLCALC 121 (H) 10/12/2022 0831    CBC    Component Value Date/Time   WBC 7.2 10/12/2022 0831   RBC 3.74 (L) 10/12/2022 0831   HGB 11.5 (L) 10/12/2022 0831   HCT 34.4 (L) 10/12/2022 0831   PLT 334 10/12/2022 0831   MCV 92.0 10/12/2022 0831   MCH 30.7 10/12/2022 0831   MCHC 33.4 10/12/2022 0831   RDW 11.9 10/12/2022 0831   LYMPHSABS 1,333 01/20/2021 0820   MONOABS 0.6 09/19/2020 1057   EOSABS 111 01/20/2021 0820   BASOSABS 39 01/20/2021 0820    Hgb A1C Lab Results  Component Value Date   HGBA1C 5.7 (H) 10/12/2022            Assessment & Plan:   Preventative health maintenance:  Flu shot declined Tetanus declined Encouraged her to get her COVID-vaccine Pap smear UTD Mammogram UTD Encouraged her to consume a balanced diet and exercise regimen Advised her to see an eye doctor and dentist annually We will check CBC, c-Met, lipid, A1c today  RTC in 6 months, follow-up chronic conditions Nicki Reaper, NP

## 2023-04-27 NOTE — Assessment & Plan Note (Signed)
Encouraged diet and exercise for weight loss ?

## 2023-04-27 NOTE — Patient Instructions (Signed)

## 2023-04-28 LAB — COMPLETE METABOLIC PANEL WITH GFR
AG Ratio: 1.6 (calc) (ref 1.0–2.5)
ALT: 16 U/L (ref 6–29)
AST: 13 U/L (ref 10–30)
Albumin: 3.9 g/dL (ref 3.6–5.1)
Alkaline phosphatase (APISO): 43 U/L (ref 31–125)
BUN: 21 mg/dL (ref 7–25)
CO2: 23 mmol/L (ref 20–32)
Calcium: 8.8 mg/dL (ref 8.6–10.2)
Chloride: 106 mmol/L (ref 98–110)
Creat: 0.59 mg/dL (ref 0.50–0.99)
Globulin: 2.5 g/dL (ref 1.9–3.7)
Glucose, Bld: 95 mg/dL (ref 65–99)
Potassium: 4.6 mmol/L (ref 3.5–5.3)
Sodium: 138 mmol/L (ref 135–146)
Total Bilirubin: 0.3 mg/dL (ref 0.2–1.2)
Total Protein: 6.4 g/dL (ref 6.1–8.1)
eGFR: 114 mL/min/{1.73_m2} (ref >=60)

## 2023-04-28 LAB — LIPID PANEL
Cholesterol: 178 mg/dL (ref <200)
HDL: 55 mg/dL (ref >=50)
LDL Cholesterol (Calc): 108 mg/dL — ABNORMAL HIGH
Non-HDL Cholesterol (Calc): 123 mg/dL (ref <130)
Total CHOL/HDL Ratio: 3.2 (calc) (ref <5.0)
Triglycerides: 63 mg/dL (ref <150)

## 2023-04-28 LAB — CBC
HCT: 35.4 % (ref 35.0–45.0)
Hemoglobin: 11.7 g/dL (ref 11.7–15.5)
MCH: 30.8 pg (ref 27.0–33.0)
MCHC: 33.1 g/dL (ref 32.0–36.0)
MCV: 93.2 fL (ref 80.0–100.0)
MPV: 10.1 fL (ref 7.5–12.5)
Platelets: 341 10*3/uL (ref 140–400)
RBC: 3.8 10*6/uL (ref 3.80–5.10)
RDW: 11.8 % (ref 11.0–15.0)
WBC: 6.5 10*3/uL (ref 3.8–10.8)

## 2023-04-28 LAB — HEMOGLOBIN A1C
Hgb A1c MFr Bld: 5.5 %{Hb} (ref <5.7)
Mean Plasma Glucose: 111 mg/dL
eAG (mmol/L): 6.2 mmol/L

## 2023-05-16 ENCOUNTER — Ambulatory Visit: Payer: 59 | Admitting: Obstetrics & Gynecology

## 2023-05-20 ENCOUNTER — Encounter: Payer: Self-pay | Admitting: Internal Medicine

## 2023-05-20 NOTE — Telephone Encounter (Signed)
She would need to be evaluated first. She may want to reach out to her neurologist if they will send it in for her without an appt.

## 2023-07-18 ENCOUNTER — Other Ambulatory Visit: Payer: Self-pay | Admitting: Internal Medicine

## 2023-07-19 NOTE — Telephone Encounter (Signed)
 Requested Prescriptions  Pending Prescriptions Disp Refills   busPIRone  (BUSPAR ) 10 MG tablet [Pharmacy Med Name: BUSPIRONE  10MG  TABLETS] 180 tablet 0    Sig: TAKE 1 TABLET(10 MG) BY MOUTH TWICE DAILY     Psychiatry: Anxiolytics/Hypnotics - Non-controlled Passed - 07/19/2023  1:34 PM      Passed - Valid encounter within last 12 months    Recent Outpatient Visits           2 months ago Encounter for general adult medical examination with abnormal findings   [place] [facility] [place], Angeline ORN, NP   9 months ago Prediabetes   [place] [facility] [place], Angeline ORN, NP   1 year ago Body aches   [place] [facility] [place], Angeline ORN, NP   1 year ago Urinary frequency   [place] [facility] [place], Angeline ORN, NP   1 year ago Encounter for general adult medical examination with abnormal findings   [place] [facility] [place], Angeline ORN, NP       Future Appointments             In 3 months Baity, Angeline ORN, NP [place] [facility]   In 8 months Hester Alm BROCKS, MD [facility]

## 2023-09-12 ENCOUNTER — Encounter: Payer: Self-pay | Admitting: Internal Medicine

## 2023-09-13 MED ORDER — CYCLOBENZAPRINE HCL 5 MG PO TABS: 5.0000 mg | ORAL_TABLET | Freq: Three times a day (TID) | ORAL | 0 refills | Status: AC | PRN

## 2023-10-27 ENCOUNTER — Ambulatory Visit: Payer: Self-pay | Admitting: Internal Medicine

## 2023-10-27 VITALS — BP 114/72 | Ht 65.0 in | Wt 164.4 lb

## 2023-10-27 DIAGNOSIS — D500 Iron deficiency anemia secondary to blood loss (chronic): Secondary | ICD-10-CM

## 2023-10-27 DIAGNOSIS — K219 Gastro-esophageal reflux disease without esophagitis: Secondary | ICD-10-CM

## 2023-10-27 DIAGNOSIS — E663 Overweight: Secondary | ICD-10-CM

## 2023-10-27 DIAGNOSIS — F5101 Primary insomnia: Secondary | ICD-10-CM

## 2023-10-27 DIAGNOSIS — F419 Anxiety disorder, unspecified: Secondary | ICD-10-CM

## 2023-10-27 DIAGNOSIS — R7303 Prediabetes: Secondary | ICD-10-CM | POA: Diagnosis not present

## 2023-10-27 DIAGNOSIS — E7800 Pure hypercholesterolemia, unspecified: Secondary | ICD-10-CM | POA: Diagnosis not present

## 2023-10-27 DIAGNOSIS — Z6827 Body mass index (BMI) 27.0-27.9, adult: Secondary | ICD-10-CM

## 2023-10-27 DIAGNOSIS — R519 Headache, unspecified: Secondary | ICD-10-CM

## 2023-10-27 DIAGNOSIS — K581 Irritable bowel syndrome with constipation: Secondary | ICD-10-CM

## 2023-10-27 MED ORDER — BUSPIRONE HCL 10 MG PO TABS: ORAL_TABLET | ORAL | 1 refills | Status: DC

## 2023-10-27 MED ORDER — TRAZODONE HCL 50 MG PO TABS: 50.0000 mg | ORAL_TABLET | Freq: Every day | ORAL | 0 refills | Status: AC

## 2023-10-27 NOTE — Assessment & Plan Note (Signed)
 Encouraged diet and exercise for weight loss ?

## 2023-10-27 NOTE — Patient Instructions (Signed)

## 2023-10-27 NOTE — Assessment & Plan Note (Signed)
Stable on current dose of buspirone She will continue to meet with her therapist Support offered

## 2023-10-27 NOTE — Assessment & Plan Note (Signed)
 Encouraged high-fiber diet and adequate water intake Not currently taking any medications at this time

## 2023-10-27 NOTE — Assessment & Plan Note (Signed)
 Okay to continue magnesium OTC Trazodone  refilled today We will monitor

## 2023-10-27 NOTE — Progress Notes (Signed)
 Subjective:    Patient ID: Robin West, female    DOB: 16-Jul-1980, 43 y.o.   MRN: [id number]  HPI  Patient presents to clinic today for 46-month follow-up of chronic conditions.  IBS: She reports mainly constipation but she reports this has improved.  She manages this with diet.  Colonoscopy from 04/2020 reviewed.  She follows with GI.  Frequent headaches: These occur around her menstrual cycle.  She is taking OCPs, emagality, imitrex and phenergan  as prescribed.  She follows with neurology.  Insomnia: She is having issues staying asleep.  She is taking magnesium OTC with good results. She has been prescribed trazodone  in the past but reports it caused vivid dreams but would be willing to try this again. There is no sleep study on file.  Anxiety: Chronic, managed on buspirone .  She is seeing a therapist.  She denies depression, SI/HI.  GERD: She is not sure what triggers this.  She no longer takes omeprazole .  Upper GI from 04/2020 reviewed.  HLD: Her last LDL was 108, triglycerides 63, 04/2023.  She is not taking any cholesterol-lowering medication at this time.  She tries to consume a low-fat diet.  Prediabetes: Her last A1c was 5.5%, 04/2023  She is not taking any oral diabetic medication at this time.  She does not check her sugars.  Iron deficiency anemia: Her last H/H was 11.7/35.4, 04/2023.  She is taking oral iron as prescribed.  She does not follow with hematology.  Review of Systems     Past Medical History:  Diagnosis Date   Allergy    Anemia    Anxiety    Chicken pox    Dysplastic nevus 02/09/2017   Mid back spinal at bra line. Mild atypia, lateral and deep margins involved.   Dysplastic nevus 02/09/2017   Right proximal post. lat. thigh. Mild atypia, close to margin.   Frequent headaches    History of dysplastic nevus 01/31/2020   left mid buttocks, moderate   IBS (irritable bowel syndrome)     Current Outpatient Medications  Medication Sig Dispense  Refill   acetaminophen  (TYLENOL ) 500 MG tablet Take 1,000 mg by mouth every 6 (six) hours as needed.     busPIRone  (BUSPAR ) 10 MG tablet TAKE 1 TABLET(10 MG) BY MOUTH TWICE DAILY 180 tablet 0   cyclobenzaprine  (FLEXERIL ) 5 MG tablet Take 1 tablet (5 mg total) by mouth 3 (three) times daily as needed for muscle spasms. 30 tablet 0   ferrous sulfate 324 MG TBEC Take 324 mg by mouth.     Galcanezumab-gnlm (EMGALITY) 120 MG/ML SOSY Inject 120 mg into the skin every 28 (twenty-eight) days.     ibuprofen  (ADVIL ) 200 MG tablet Take 200 mg by mouth every 6 (six) hours as needed.     JUNEL FE 1/20 1-20 MG-MCG tablet Take 1 tablet by mouth daily.  3   Multiple Vitamin (MULTI-VITAMINS) TABS Take by mouth.     Probiotic Product (PROBIOTIC PO) Take by mouth. BYE BYE Bloat- one daily     promethazine  (PHENERGAN ) 25 MG tablet Take 25 mg by mouth as needed.      SUMAtriptan (IMITREX) 100 MG tablet Take 1 tablet by mouth as needed. For migraine may repeat in 2hrs if not better Max 2/24hr  2   SUMAtriptan 6 MG/0.5ML SOAJ as needed.   3   No current facility-administered medications for this visit.    No Known Allergies  Family History  Problem Relation Age of Onset  Hypertension Mother    Hyperlipidemia Father    Hypertension Father    Arthritis Maternal Grandmother    Stroke Maternal Grandfather    Ovarian cancer Paternal Grandmother    Ovarian cancer Paternal Aunt    Esophageal cancer Paternal Uncle    Healthy Half-Sister    Healthy Half-Sister    Healthy Half-Sister    Colon cancer Neg Hx    Stomach cancer Neg Hx    Rectal cancer Neg Hx     Social History   Socioeconomic History   Marital status: Married    Spouse name: Not on file   Number of children: 2   Years of education: Not on file   Highest education level: Associate degree: occupational, Scientist, product/process development, or vocational program  Occupational History   Occupation: Airline pilot Rep  Tobacco Use   Smoking status: Never   Smokeless tobacco:  Never  Vaping Use   Vaping status: Never Used  Substance and Sexual Activity   Alcohol use: Not Currently   Drug use: No   Sexual activity: Yes    Partners: Male    Birth control/protection: OCP  Other Topics Concern   Not on file  Social History Narrative   Not on file   Social Drivers of Health   Financial Resource Strain: Low Risk  (10/27/2023)   Overall Financial Resource Strain (CARDIA)    Difficulty of Paying Living Expenses: Not hard at all  Food Insecurity: No Food Insecurity (10/27/2023)   Hunger Vital Sign    Worried About Running Out of Food in the Last Year: Never true    Ran Out of Food in the Last Year: Never true  Transportation Needs: No Transportation Needs (10/27/2023)   PRAPARE - Administrator, Civil Service (Medical): No    Lack of Transportation (Non-Medical): No  Physical Activity: Sufficiently Active (10/27/2023)   Exercise Vital Sign    Days of Exercise per Week: 3 days    Minutes of Exercise per Session: 60 min  Stress: Stress Concern Present (10/27/2023)   [employer] of Occupational Health - Occupational Stress Questionnaire    Feeling of Stress : Rather much  Social Connections: Socially Integrated (10/27/2023)   Social Connection and Isolation Panel [NHANES]    Frequency of Communication with Friends and Family: Three times a week    Frequency of Social Gatherings with Friends and Family: Once a week    Attends Religious Services: More than 4 times per year    Active Member of [employer] or Organizations: Yes    Attends Banker Meetings: 1 to 4 times per year    Marital Status: Married  Catering manager Violence: Not At Risk (05/11/2022)   [facility], [facility]   Humiliation, Afraid, Rape, and Kick questionnaire    Fear of Current or Ex-Partner: No    Emotionally Abused: No    Physically Abused: No    Sexually Abused: No     Constitutional: Patient reports intermittent headaches.  Denies  fever, malaise, fatigue, or abrupt weight changes.  HEENT: Denies eye pain, eye redness, ear pain, ringing in the ears, wax buildup, runny nose, nasal congestion, bloody nose, or sore throat. Respiratory: Denies difficulty breathing, shortness of breath, cough or sputum production.   Cardiovascular: Patient reports intermittent chest tightness (anxiety).  Denies chest pain, chest palpitations or swelling in the hands or feet.  Gastrointestinal: Pt reports intermittent constipation. Denies abdominal pain, bloating, diarrhea or blood in the stool.  GU: Denies urgency, frequency, pain with urination, burning sensation, blood in urine, odor or discharge. Musculoskeletal: Denies decrease in range of motion, difficulty with gait, muscle pain or joint pain and swelling.  Skin: Denies redness, rashes, lesions or ulcercations.  Neurological: Patient reports insomnia.  Denies dizziness, difficulty with memory, difficulty with speech or problems with balance and coordination.  Psych: Patient has a history of anxiety.  Denies depression, SI/HI.  No other specific complaints in a complete review of systems (except as listed in HPI above).  Objective:   Physical Exam BP 114/72 (BP Location: Left Arm, Patient Position: Sitting, Cuff Size: Normal)   Ht 5\' 5"  (1.651 m)   Wt 164 lb 6.4 oz (74.6 kg)   BMI 27.36 kg/m    Wt Readings from Last 3 Encounters:  04/27/23 156 lb (70.8 kg)  10/12/22 156 lb (70.8 kg)  06/30/22 154 lb 12.8 oz (70.2 kg)    General: Appears her stated age, overweight, in NAD. Skin: Warm, dry and intact.  Cardiovascular: Normal rate and rhythm. S1,S2 noted.  No murmur, rubs or gallops noted. No JVD or BLE edema.  Pulmonary/Chest: Normal effort and positive vesicular breath sounds. No respiratory distress. No wheezes, rales or ronchi noted.  Abdomen: Normal bowel sounds.  Musculoskeletal: No difficulty with gait.  Neurological: Alert and oriented. Coordination normal.  Psychiatric:  Mood and affect normal. Behavior is normal. Judgment and thought content normal.     BMET    Component Value Date/Time   NA 138 04/27/2023 0853   K 4.6 04/27/2023 0853   CL 106 04/27/2023 0853   CO2 23 04/27/2023 0853   GLUCOSE 95 04/27/2023 0853   BUN 21 04/27/2023 0853   CREATININE 0.59 04/27/2023 0853   CALCIUM 8.8 04/27/2023 0853   GFRNONAA >60 06/03/2021 1643   GFRAA >60 02/29/2016 1902    Lipid Panel     Component Value Date/Time   CHOL 178 04/27/2023 0853   TRIG 63 04/27/2023 0853   HDL 55 04/27/2023 0853   CHOLHDL 3.2 04/27/2023 0853   VLDL 13.4 03/06/2020 0838   LDLCALC 108 (H) 04/27/2023 0853    CBC    Component Value Date/Time   WBC 6.5 04/27/2023 0853   RBC 3.80 04/27/2023 0853   HGB 11.7 04/27/2023 0853   HCT 35.4 04/27/2023 0853   PLT 341 04/27/2023 0853   MCV 93.2 04/27/2023 0853   MCH 30.8 04/27/2023 0853   MCHC 33.1 04/27/2023 0853   RDW 11.8 04/27/2023 0853   LYMPHSABS 1,333 01/20/2021 0820   MONOABS 0.6 09/19/2020 1057   EOSABS 111 01/20/2021 0820   BASOSABS 39 01/20/2021 0820    Hgb A1C Lab Results  Component Value Date   HGBA1C 5.5 04/27/2023           Assessment & Plan:     RTC in 6 months for your annual exam Helayne Lo, NP

## 2023-10-27 NOTE — Assessment & Plan Note (Signed)
CBC today Continue oral iron 

## 2023-10-27 NOTE — Assessment & Plan Note (Signed)
 C-Met and lipid profile today Encouraged her to consume low-fat diet

## 2023-10-27 NOTE — Assessment & Plan Note (Signed)
 Continue OCPs, emgality, imitrex and phenergan  She will continue to follow with neurology

## 2023-10-27 NOTE — Assessment & Plan Note (Signed)
 A1c today Encourage low-carb diet and exercise for weight loss

## 2023-10-27 NOTE — Assessment & Plan Note (Signed)
 Currently not an issue off meds Encourage weight loss as this can help reduce reflux symptoms We will monitor

## 2023-10-28 ENCOUNTER — Encounter: Payer: Self-pay | Admitting: Internal Medicine

## 2023-10-28 LAB — CBC
HCT: 36.7 % (ref 35.0–45.0)
Hemoglobin: 12.2 g/dL (ref 11.7–15.5)
MCH: 31 pg (ref 27.0–33.0)
MCHC: 33.2 g/dL (ref 32.0–36.0)
MCV: 93.1 fL (ref 80.0–100.0)
MPV: 10.1 fL (ref 7.5–12.5)
Platelets: 332 10*3/uL (ref 140–400)
RBC: 3.94 10*6/uL (ref 3.80–5.10)
RDW: 12.3 % (ref 11.0–15.0)
WBC: 6.7 10*3/uL (ref 3.8–10.8)

## 2023-10-28 LAB — COMPREHENSIVE METABOLIC PANEL WITH GFR
AG Ratio: 1.6 (calc) (ref 1.0–2.5)
ALT: 19 U/L (ref 6–29)
AST: 14 U/L (ref 10–30)
Albumin: 3.9 g/dL (ref 3.6–5.1)
Alkaline phosphatase (APISO): 38 U/L (ref 31–125)
BUN: 21 mg/dL (ref 7–25)
CO2: 23 mmol/L (ref 20–32)
Calcium: 9.1 mg/dL (ref 8.6–10.2)
Chloride: 106 mmol/L (ref 98–110)
Creat: 0.66 mg/dL (ref 0.50–0.99)
Globulin: 2.4 g/dL (ref 1.9–3.7)
Glucose, Bld: 82 mg/dL (ref 65–99)
Potassium: 4.3 mmol/L (ref 3.5–5.3)
Sodium: 139 mmol/L (ref 135–146)
Total Bilirubin: 0.3 mg/dL (ref 0.2–1.2)
Total Protein: 6.3 g/dL (ref 6.1–8.1)
eGFR: 111 mL/min/{1.73_m2} (ref >=60)

## 2023-10-28 LAB — HEMOGLOBIN A1C
Hgb A1c MFr Bld: 5.7 % — ABNORMAL HIGH (ref <5.7)
Mean Plasma Glucose: 117 mg/dL
eAG (mmol/L): 6.5 mmol/L

## 2023-10-28 LAB — LIPID PANEL
Cholesterol: 221 mg/dL — ABNORMAL HIGH (ref <200)
HDL: 53 mg/dL (ref >=50)
LDL Cholesterol (Calc): 142 mg/dL — ABNORMAL HIGH
Non-HDL Cholesterol (Calc): 168 mg/dL — ABNORMAL HIGH (ref <130)
Total CHOL/HDL Ratio: 4.2 (calc) (ref <5.0)
Triglycerides: 135 mg/dL (ref <150)

## 2024-03-19 ENCOUNTER — Other Ambulatory Visit: Payer: Self-pay | Admitting: Internal Medicine

## 2024-03-19 DIAGNOSIS — Z1231 Encounter for screening mammogram for malignant neoplasm of breast: Secondary | ICD-10-CM

## 2024-03-22 ENCOUNTER — Ambulatory Visit: Admitting: Dermatology

## 2024-03-29 ENCOUNTER — Ambulatory Visit: Payer: 59 | Admitting: Dermatology

## 2024-04-10 ENCOUNTER — Ambulatory Visit
Admission: RE | Admit: 2024-04-10 | Discharge: 2024-04-10 | Disposition: A | Discharge status: Home / Self Care | Source: Ambulatory Visit

## 2024-04-10 DIAGNOSIS — Z1231 Encounter for screening mammogram for malignant neoplasm of breast: Secondary | ICD-10-CM

## 2024-04-12 ENCOUNTER — Ambulatory Visit

## 2024-04-12 DIAGNOSIS — Z86018 Personal history of other benign neoplasm: Secondary | ICD-10-CM

## 2024-04-12 DIAGNOSIS — Z1283 Encounter for screening for malignant neoplasm of skin: Secondary | ICD-10-CM

## 2024-04-12 DIAGNOSIS — L578 Other skin changes due to chronic exposure to nonionizing radiation: Secondary | ICD-10-CM

## 2024-04-12 DIAGNOSIS — D1801 Hemangioma of skin and subcutaneous tissue: Secondary | ICD-10-CM

## 2024-04-12 DIAGNOSIS — W908XXA Exposure to other nonionizing radiation, initial encounter: Secondary | ICD-10-CM

## 2024-04-12 DIAGNOSIS — L821 Other seborrheic keratosis: Secondary | ICD-10-CM | POA: Diagnosis not present

## 2024-04-12 DIAGNOSIS — L814 Other melanin hyperpigmentation: Secondary | ICD-10-CM | POA: Diagnosis not present

## 2024-04-12 DIAGNOSIS — D229 Melanocytic nevi, unspecified: Secondary | ICD-10-CM

## 2024-04-12 NOTE — Patient Instructions (Signed)
 Sunscreen  Who needs sunscreen? Everyone. Sunscreen use can help prevent skin cancer by protecting you from the sun's harmful ultraviolet rays. Anyone can get skin cancer, regardless of age, gender or race. In fact, it is estimated that one in five Americans will develop skin cancer in their lifetime.  Sunscreen alone cannot fully protect you. In addition to wearing sunscreen, dermatologists recommend taking the following steps to protect your skin and find skin cancer early:  Seek shade when appropriate, remembering that the sun's rays are strongest between 10 a.m. and 2 p.m. If your shadow is shorter than you are, seek shade. Dress to protect yourself from the sun by wearing a lightweight long-sleeved shirt, pants, a wide-brimmed hat and sunglasses, when possible.  Use extra caution near water, snow and sand as they reflect the damaging rays of the sun, which can increase your chance of sunburn.  Get vitamin D safely through a healthy diet that may include vitamin supplements. Don't seek the sun. Avoid tanning beds. Ultraviolet light from the sun and tanning beds can cause skin cancer and wrinkling. If you want to look tan, you may wish to use a self-tanning product, but continue to use sunscreen with it.  When should I use sunscreen? Every day you go outside--even if you're just walking to and from your form of transportation. The sun emits harmful UV rays year-round. Even on cloudy days, up to 80 percent of the sun's harmful UV rays can penetrate your skin. Snow, sand and water increase the need for sunscreen because they reflect the sun's rays.  How much sunscreen should I use, and how often should I apply it? Most people only apply 25-50 percent of the recommended amount of sunscreen. Apply enough sunscreen to cover all exposed skin. Most adults need about 1 ounce -- or enough to fill a shot glass -- to fully cover their body.  Don't forget to apply to the tops of your feet, your neck, your ears  and the top of your head. Apply sunscreen to dry skin 15 minutes before going outdoors.  Skin cancer also can form on the lips. To protect your lips, apply a lip balm or lipstick that contains sunscreen with an SPF of 30 or higher.  When outdoors, reapply sunscreen approximately every two hours, or after swimming or sweating, according to the directions on the bottle.   Broad-spectrum sunscreens protect against both UVA and UVB rays. What is the difference between the rays? Sunlight consists of two types of harmful rays that reach the earth -- UVA rays and UVB rays. Overexposure to either can lead to skin cancer. In addition to causing skin cancer, here's what each of these rays do:  UVA rays (or aging rays) can prematurely age your skin, causing wrinkles and age spots, and can pass through window glass. UVB rays (or burning rays) are the primary cause of sunburn and are blocked by window glass  There is no safe way to tan. Every time you tan, you damage your skin. As this damage builds, you speed up the aging of your skin and increase your risk for all types of skin cancer.  What is the difference between chemical and physical sunscreens? Chemical sunscreens work like a sponge, absorbing the sun's rays. They contain one or more of the following active ingredients: oxybenzone, avobenzone, octisalate, octocrylene, homosalate and octinoxate. These formulations tend to be easier to rub into the skin without leaving a white residue.   Physical sunscreens work like a shield,  sitting sit on the surface of your skin and deflecting the sun's rays. They contain the active ingredients zinc oxide and/or titanium dioxide. Use this sunscreen if you have sensitive skin.   What type of sunscreen should I use? The best type of sunscreen is the one you will use again and again. Just make sure it offers broad-spectrum (UVA and UVB) protection, has an SPF of 30+, and is water-resistant. The kind of sunscreen you use is  a matter of personal choice, and may vary depending on the area of the body to be protected. Available sunscreen options include lotions, creams, gels, ointments, wax sticks and sprays.  Recommended physical sunscreens for face: - Neutrogena Sheer Zinc - Aveeno Positively Mineral Sensitive - CeraVe Hydrating Mineral (also has a tinted version) - La Roche-Posay Anthelios Mineral Face (comes as a cream, lotion, light fluid, and there is also a tinted version).  - EltaMD UV Clear (also has a tinted version)  Recommended physical sunscreens for body: - Neutrogena Sheer Zinc Dry-Touch Sunscreen Sensitive Skin Lotion Broad Spectrum SPF 50 - Aveeno Positively Mineral Sensitive Skin Sunscreen Broad Spectrum SPF 50 - La Roche-Posay Anthelios SPF 50 Mineral Sunscreen - Gentle Lotion - CeraVe Hydrating Mineral Sunscreen SPF 50  Recommended chemical sunscreens for face: - Anthelios UV Correct Face Sunscreen SPF 70 with Niacinamide - Neutrogena Clear Face Oil-Free SPF 50 with Helioplex - Neutrogena Sport Face Oil-Free SPF 70+ with Helioplex - Aveeno Protect + Hydrate Sunscreen For Face SPF 70 - La Roche-Posay Anthelios Light Fluid Sunscreen for Face SPF 60  Recommended chemical sunscreens for body: - Neutrogena Ultra Sheer Dry-Touch Sunscreen SPF 70 - Aveeno Protect + Hydrate Broad Spectrum All-Day Hydration SPF 60 (comes in a big pump) - La Roche-Posay Anthelios Melt-In Milk Sunscreen SPF 60   Melanoma ABCDEs  Melanoma is the most dangerous type of skin cancer, and is the leading cause of death from skin disease.  You are more likely to develop melanoma if you: Have light-colored skin, light-colored eyes, or red or blond hair Spend a lot of time in the sun Tan regularly, either outdoors or in a tanning bed Have had blistering sunburns, especially during childhood Have a close family member who has had a melanoma Have atypical moles or large birthmarks  Early detection of melanoma is key  since treatment is typically straightforward and cure rates are extremely high if we catch it early.   The first sign of melanoma is often a change in a mole or a new dark spot.  The ABCDE system is a way of remembering the signs of melanoma.  A for asymmetry:  The two halves do not match. B for border:  The edges of the growth are irregular. C for color:  A mixture of colors are present instead of an even brown color. D for diameter:  Melanomas are usually (but not always) greater than 6mm - the size of a pencil eraser. E for evolution:  The spot keeps changing in size, shape, and color.  Please check your skin once per month between visits. You can use a small mirror in front and a large mirror behind you to keep an eye on the back side or your body.   If you see any new or changing lesions before your next follow-up, please call to schedule a visit.  Please continue daily skin protection including broad spectrum sunscreen SPF 30+ to sun-exposed areas, reapplying every 2 hours as needed when you're outdoors.   Staying in  the shade or wearing long sleeves, sun glasses (UVA+UVB protection) and wide brim hats (4-inch brim around the entire circumference of the hat) are also recommended for sun protection.    Due to recent changes in healthcare laws, you may see results of your pathology and/or laboratory studies on MyChart before the doctors have had a chance to review them. We understand that in some cases there may be results that are confusing or concerning to you. Please understand that not all results are received at the same time and often the doctors may need to interpret multiple results in order to provide you with the best plan of care or course of treatment. Therefore, we ask that you please give us  2 business days to thoroughly review all your results before contacting the office for clarification. Should we see a critical lab result, you will be contacted sooner.   If You Need  Anything After Your Visit  If you have any questions or concerns for your doctor, please call our main line at [phone] and press option 4 to reach your doctor's medical assistant. If no one answers, please leave a voicemail as directed and we will return your call as soon as possible. Messages left after 4 pm will be answered the following business day.   You may also send us  a message via MyChart. We typically respond to MyChart messages within 1-2 business days.  For prescription refills, please ask your pharmacy to contact our office. Our fax number is [phone].  If you have an urgent issue when the clinic is closed that cannot wait until the next business day, you can page your doctor at the number below.    Please note that while we do our best to be available for urgent issues outside of office hours, we are not available 24/7.   If you have an urgent issue and are unable to reach us , you may choose to seek medical care at your doctor's office, retail clinic, urgent care center, or emergency room.  If you have a medical emergency, please immediately call 911 or go to the emergency department.  Pager Numbers  - Dr. Hester: [phone]  - Dr. Jackquline: [phone]  - Dr. Claudene: [phone]   In the event of inclement weather, please call our main line at [phone] for an update on the status of any delays or closures.  Dermatology Medication Tips: Please keep the boxes that topical medications come in in order to help keep track of the instructions about where and how to use these. Pharmacies typically print the medication instructions only on the boxes and not directly on the medication tubes.   If your medication is too expensive, please contact our office at [phone] option 4 or send us  a message through MyChart.   We are unable to tell what your co-pay for medications will be in advance as this is different depending on your insurance coverage. However, we  may be able to find a substitute medication at lower cost or fill out paperwork to get insurance to cover a needed medication.   If a prior authorization is required to get your medication covered by your insurance company, please allow us  1-2 business days to complete this process.  Drug prices often vary depending on where the prescription is filled and some pharmacies may offer cheaper prices.  The website www.goodrx.com contains coupons for medications through different pharmacies. The prices here do not account for what the cost may be with help from insurance (  it may be cheaper with your insurance), but the website can give you the price if you did not use any insurance.  - You can print the associated coupon and take it with your prescription to the pharmacy.  - You may also stop by our office during regular business hours and pick up a GoodRx coupon card.  - If you need your prescription sent electronically to a different pharmacy, notify our office through [facility], [place] or by phone at [phone] option 4.     Si Usted Necesita Algo Despus de Su Visita  Tambin puede enviarnos un mensaje a travs de Clinical cytogeneticist. Por lo general respondemos a los mensajes de MyChart en el transcurso de 1 a 2 das hbiles.  Para renovar recetas, por favor pida a su farmacia que se ponga en contacto con nuestra oficina. Randi lakes de fax es [place] [phone].  Si tiene un asunto urgente cuando la clnica est cerrada y que no puede esperar hasta el siguiente da hbil, puede llamar/localizar a su doctor(a) al nmero que aparece a continuacin.   Por favor, tenga en cuenta que aunque hacemos todo lo posible para estar disponibles para asuntos urgentes fuera del horario de [place], no estamos disponibles las 24 horas del da, los 7 [address]  [address] de la [place].   Si tiene un problema urgente y no puede comunicarse con nosotros, puede optar por buscar atencin mdica  en el consultorio de su doctor(a), en una  clnica privada, en un centro de atencin urgente o en una sala de emergencias.  Si tiene Engineer, drilling, por favor llame inmediatamente al 911 o vaya a la sala de emergencias.  Nmeros de bper  - Dr. Hester: [phone]  - Dra. Jackquline: [phone]  - Dr. Claudene: [phone]   En caso de inclemencias del tiempo, por favor llame a landry capes principal al [phone] para una actualizacin sobre el [place] de cualquier retraso o cierre.  Consejos para la medicacin en dermatologa: Por favor, guarde las cajas en las que vienen los medicamentos de uso tpico para ayudarle a seguir las instrucciones sobre dnde y cmo usarlos. Las farmacias generalmente imprimen las instrucciones del medicamento slo en las cajas y no directamente en los tubos del [place].   Si su medicamento es muy caro, por favor, pngase en contacto con landry rieger llamando al [phone] y presione la opcin 4 o envenos un mensaje a travs de Clinical cytogeneticist.   No podemos decirle cul ser su copago por los medicamentos por adelantado ya que esto es diferente dependiendo de la cobertura de su seguro. Sin embargo, es posible que podamos encontrar un medicamento sustituto a Audiological scientist un formulario para que el seguro cubra el medicamento que se considera necesario.   Si se requiere una autorizacin previa para que su compaa de seguros malta su medicamento, por favor permtanos de 1 a 2 das hbiles para completar este proceso.  Los precios de los medicamentos varan con frecuencia dependiendo del Environmental consultant de dnde se surte la receta y alguna farmacias pueden ofrecer precios ms baratos.  El sitio web www.goodrx.com tiene cupones para medicamentos de Health and safety inspector. Los precios aqu no tienen en cuenta lo que podra costar con la ayuda del seguro (puede ser ms barato con su seguro), pero el sitio web puede darle el precio si no utiliz Tourist information centre manager.  - Puede imprimir el cupn correspondiente  y llevarlo con su receta a la farmacia.  - Tambin puede pasar por nuestra oficina durante el horario de  atencin regular y Education officer, museum una tarjeta de cupones de GoodRx.  - Si necesita que su receta se enve electrnicamente a una farmacia diferente, informe a nuestra oficina a travs de MyChart de [place] o por telfono llamando al [phone] y presione la opcin 4.

## 2024-04-12 NOTE — Progress Notes (Signed)
    Subjective   Robin West is a 43 y.o. female who presents for the following: Total body skin exam for skin cancer screening and mole check. The patient has spots, moles and lesions to be evaluated, some may be new or changing and the patient may have concern these could be cancer. Patient is established patient .  Today patient reports: No lesions of concern.   Review of Systems:    No other skin or systemic complaints except as noted in HPI or Assessment and Plan.  The following portions of the chart were reviewed this encounter and updated as appropriate: medications, allergies, medical history  Relevant Medical History:  Personal history of dysplastic nevi.   Objective  Well appearing patient in no apparent distress; mood and affect are within normal limits. Examination was performed of the: Full Skin Examination: scalp, head, eyes, ears, nose, lips, neck, chest, axillae, abdomen, back, buttocks, bilateral upper extremities, bilateral lower extremities, hands, feet, fingers, toes, fingernails, and toenails.   Examination notable for: SKIN EXAM, Angioma(s): Scattered red vascular papule(s)  , Lentigo/lentigines: Scattered pigmented macules that are tan to brown in color and are somewhat non-uniform in shape and concentrated in the sun-exposed areas, Nevus/nevi: Scattered well-demarcated, regular, pigmented macule(s) and/or papule(s)  , Seborrheic Keratosis(es): Stuck-on appearing keratotic papule(s) on the trunk, some  irritated with redness, crusting, edema, and/or partial avulsion, Actinic Damage/Elastosis: chronic sun damage: dyspigmentation, telangiectasia, and wrinkling  Examination limited by: Undergarments and Patient deferred removal       Assessment & Plan   SKIN CANCER SCREENING PERFORMED TODAY.  BENIGN SKIN FINDINGS  - Lentigines  - Seborrheic keratoses  - Hemangiomas   - Nevus/Multiple Benign Nevi  - Reassurance provided regarding the benign appearance of  lesions noted on exam today; no treatment is indicated in the absence of symptoms/changes. - Reinforced importance of photoprotective strategies including liberal and frequent sunscreen use of a broad-spectrum SPF 30 or greater, use of protective clothing, and sun avoidance for prevention of cutaneous malignancy and photoaging.  Counseled patient on the importance of regular self-skin monitoring as well as routine clinical skin examinations as scheduled.   ACTINIC DAMAGE - Chronic condition, secondary to cumulative UV/sun exposure - Recommend daily broad spectrum sunscreen SPF 30+ to sun-exposed areas, reapply every 2 hours as needed.  - Staying in the shade or wearing long sleeves, sun glasses (UVA+UVB protection) and wide brim hats (4-inch brim around the entire circumference of the hat) are also recommended for sun protection.  - Call for new or changing lesions.  Personal history of dysplastic nevi  - Reviewed medical history for full details  - Reviewed sun protective measures as above - Encouraged full body skin exams     Level of service outlined above   Procedures, orders, diagnosis for this visit:    There are no diagnoses linked to this encounter.  Return to clinic: Return in about 1 year (around 04/12/2025) for TBSE, HxDysplastic Nevi.  Documentation:  I, Jacquelynn V. Wilfred, CMA, am acting as scribe for Lauraine JAYSON Kanaris, MD .  I have reviewed the above documentation for accuracy and completeness, and I agree with the above.  Lauraine JAYSON Kanaris, MD

## 2024-04-23 ENCOUNTER — Other Ambulatory Visit: Payer: Self-pay | Admitting: Internal Medicine

## 2024-04-25 NOTE — Telephone Encounter (Signed)
 Requested Prescriptions  Pending Prescriptions Disp Refills   busPIRone  (BUSPAR ) 10 MG tablet [Pharmacy Med Name: BUSPIRONE  10MG  TABLETS] 180 tablet 0    Sig: TAKE 1 TABLET(10 MG) BY MOUTH TWICE DAILY     Psychiatry: Anxiolytics/Hypnotics - Non-controlled Passed - 04/25/2024  3:38 PM      Passed - Valid encounter within last 12 months    Recent Outpatient Visits           6 months ago Prediabetes   [facility] [place], Angeline ORN, NP       Future Appointments             In 11 months Raymund, Lauraine BROCKS, MD [facility]

## 2024-05-01 ENCOUNTER — Encounter: Admitting: Internal Medicine

## 2024-07-19 ENCOUNTER — Other Ambulatory Visit: Payer: Self-pay | Admitting: Internal Medicine

## 2024-07-20 NOTE — Telephone Encounter (Signed)
 Requested Prescriptions  Pending Prescriptions Disp Refills   busPIRone  (BUSPAR ) 10 MG tablet [Pharmacy Med Name: BUSPIRONE  10MG  TABLETS] 180 tablet 0    Sig: TAKE 1 TABLET(10 MG) BY MOUTH TWICE DAILY     Psychiatry: Anxiolytics/Hypnotics - Non-controlled Passed - 07/20/2024 10:40 AM      Passed - Valid encounter within last 12 months    Recent Outpatient Visits           8 months ago Prediabetes   [facility] [place], Angeline ORN, NP       Future Appointments             In 8 months Raymund, Lauraine BROCKS, MD [facility]

## 2025-04-15 ENCOUNTER — Ambulatory Visit
